# Patient Record
Sex: Male | Born: 1945
Health system: Southern US, Community
[De-identification: ages and names within clinical notes are randomized; demographics above are authoritative.]

## PROBLEM LIST (undated history)

## (undated) DIAGNOSIS — Z951 Presence of aortocoronary bypass graft: Secondary | ICD-10-CM

## (undated) DIAGNOSIS — M519 Unspecified thoracic, thoracolumbar and lumbosacral intervertebral disc disorder: Secondary | ICD-10-CM

## (undated) DIAGNOSIS — M5430 Sciatica, unspecified side: Secondary | ICD-10-CM

## (undated) DIAGNOSIS — I10 Essential (primary) hypertension: Secondary | ICD-10-CM

## (undated) DIAGNOSIS — E785 Hyperlipidemia, unspecified: Secondary | ICD-10-CM

## (undated) DIAGNOSIS — E079 Disorder of thyroid, unspecified: Secondary | ICD-10-CM

## (undated) DIAGNOSIS — I2510 Atherosclerotic heart disease of native coronary artery without angina pectoris: Secondary | ICD-10-CM

## (undated) DIAGNOSIS — I4891 Unspecified atrial fibrillation: Secondary | ICD-10-CM

## (undated) DIAGNOSIS — L409 Psoriasis, unspecified: Secondary | ICD-10-CM

## (undated) HISTORY — DX: Sciatica, unspecified side: M54.30

## (undated) HISTORY — DX: Unspecified atrial fibrillation: I48.91

## (undated) HISTORY — DX: Presence of aortocoronary bypass graft: Z95.1

## (undated) HISTORY — DX: Unspecified thoracic, thoracolumbar and lumbosacral intervertebral disc disorder: M51.9

## (undated) HISTORY — PX: TONSILLECTOMY: SUR1361

## (undated) HISTORY — DX: Hyperlipidemia, unspecified: E78.5

## (undated) HISTORY — DX: Essential (primary) hypertension: I10

## (undated) HISTORY — DX: Psoriasis, unspecified: L40.9

## (undated) HISTORY — DX: Disorder of thyroid, unspecified: E07.9

---

## 2005-06-15 ENCOUNTER — Emergency Department (HOSPITAL_COMMUNITY): Admission: EM | Admit: 2005-06-15 | Discharge: 2005-06-15 | Payer: Self-pay | Admitting: Emergency Medicine

## 2011-01-13 ENCOUNTER — Inpatient Hospital Stay (HOSPITAL_COMMUNITY)
Admission: EM | Admit: 2011-01-13 | Discharge: 2011-01-24 | DRG: 546 | Disposition: A | Discharge status: Home / Self Care | Payer: BC Managed Care – PPO | Attending: Surgery | Admitting: Surgery

## 2011-01-13 ENCOUNTER — Encounter (HOSPITAL_COMMUNITY)
Admission: EM | Disposition: A | Discharge status: Home / Self Care | Payer: Self-pay | Source: Home / Self Care | Attending: Cardiology

## 2011-01-13 ENCOUNTER — Encounter: Payer: Self-pay | Admitting: Nurse Practitioner

## 2011-01-13 ENCOUNTER — Emergency Department (HOSPITAL_COMMUNITY): Payer: BC Managed Care – PPO

## 2011-01-13 ENCOUNTER — Ambulatory Visit (HOSPITAL_COMMUNITY): Admit: 2011-01-13 | Payer: Self-pay | Admitting: Cardiology

## 2011-01-13 DIAGNOSIS — I2510 Atherosclerotic heart disease of native coronary artery without angina pectoris: Secondary | ICD-10-CM

## 2011-01-13 DIAGNOSIS — I249 Acute ischemic heart disease, unspecified: Secondary | ICD-10-CM

## 2011-01-13 DIAGNOSIS — E785 Hyperlipidemia, unspecified: Secondary | ICD-10-CM | POA: Diagnosis present

## 2011-01-13 DIAGNOSIS — Y9210 Unspecified residential institution as the place of occurrence of the external cause: Secondary | ICD-10-CM | POA: Diagnosis not present

## 2011-01-13 DIAGNOSIS — I4891 Unspecified atrial fibrillation: Secondary | ICD-10-CM | POA: Diagnosis present

## 2011-01-13 DIAGNOSIS — IMO0002 Reserved for concepts with insufficient information to code with codable children: Secondary | ICD-10-CM | POA: Diagnosis not present

## 2011-01-13 DIAGNOSIS — I10 Essential (primary) hypertension: Secondary | ICD-10-CM | POA: Diagnosis present

## 2011-01-13 DIAGNOSIS — R079 Chest pain, unspecified: Secondary | ICD-10-CM

## 2011-01-13 DIAGNOSIS — Y840 Cardiac catheterization as the cause of abnormal reaction of the patient, or of later complication, without mention of misadventure at the time of the procedure: Secondary | ICD-10-CM | POA: Diagnosis not present

## 2011-01-13 DIAGNOSIS — I214 Non-ST elevation (NSTEMI) myocardial infarction: Principal | ICD-10-CM

## 2011-01-13 HISTORY — DX: Atherosclerotic heart disease of native coronary artery without angina pectoris: I25.10

## 2011-01-13 HISTORY — PX: LEFT HEART CATHETERIZATION WITH CORONARY ANGIOGRAM: SHX5451

## 2011-01-13 LAB — COMPREHENSIVE METABOLIC PANEL
ALT: 21 U/L (ref 0–53)
AST: 26 U/L (ref 0–37)
Alkaline Phosphatase: 52 U/L (ref 39–117)
CO2: 27 mEq/L (ref 19–32)
Chloride: 105 mEq/L (ref 96–112)
GFR calc non Af Amer: 88 mL/min — ABNORMAL LOW (ref >90)
Potassium: 3.5 mEq/L (ref 3.5–5.1)
Sodium: 139 mEq/L (ref 135–145)
Total Bilirubin: 0.7 mg/dL (ref 0.3–1.2)

## 2011-01-13 LAB — BASIC METABOLIC PANEL
CO2: 29 mEq/L (ref 19–32)
Calcium: 9.1 mg/dL (ref 8.4–10.5)
Creatinine, Ser: 1.03 mg/dL (ref 0.50–1.35)
GFR calc non Af Amer: 74 mL/min — ABNORMAL LOW (ref >90)
Glucose, Bld: 113 mg/dL — ABNORMAL HIGH (ref 70–99)
Potassium: 4.4 mEq/L (ref 3.5–5.1)

## 2011-01-13 LAB — CBC
HCT: 45 % (ref 39.0–52.0)
Hemoglobin: 16.1 g/dL (ref 13.0–17.0)
MCH: 31.3 pg (ref 26.0–34.0)
MCV: 87.5 fL (ref 78.0–100.0)
RDW: 12.4 % (ref 11.5–15.5)

## 2011-01-13 LAB — CARDIAC PANEL(CRET KIN+CKTOT+MB+TROPI)
CK, MB: 8.5 ng/mL (ref 0.3–4.0)
Total CK: 202 U/L (ref 7–232)
Total CK: 199 U/L (ref 7–232)
Troponin I: 1.25 ng/mL (ref <0.30)
Troponin I: 1.2 ng/mL (ref <0.30)

## 2011-01-13 LAB — POCT ACTIVATED CLOTTING TIME: Activated Clotting Time: 166 seconds

## 2011-01-13 LAB — DIFFERENTIAL
Basophils Relative: 0 % (ref 0–1)
Eosinophils Absolute: 0.1 10*3/uL (ref 0.0–0.7)
Eosinophils Relative: 1 % (ref 0–5)
Lymphocytes Relative: 15 % (ref 12–46)
Monocytes Relative: 6 % (ref 3–12)

## 2011-01-13 LAB — MRSA PCR SCREENING: MRSA by PCR: NEGATIVE

## 2011-01-13 LAB — URINALYSIS, ROUTINE W REFLEX MICROSCOPIC
Bilirubin Urine: NEGATIVE
Ketones, ur: NEGATIVE mg/dL
Leukocytes, UA: NEGATIVE
Protein, ur: NEGATIVE mg/dL
Specific Gravity, Urine: 1.012 (ref 1.005–1.030)
pH: 6.5 (ref 5.0–8.0)

## 2011-01-13 LAB — PROTIME-INR: INR: 1 (ref 0.00–1.49)

## 2011-01-13 SURGERY — LEFT HEART CATHETERIZATION WITH CORONARY ANGIOGRAM
Anesthesia: LOCAL

## 2011-01-13 MED ORDER — LIDOCAINE HCL (PF) 1 % IJ SOLN
INTRAMUSCULAR | Status: AC
  Filled 2011-01-13: qty 30

## 2011-01-13 MED ORDER — HEPARIN (PORCINE) IN NACL 2-0.9 UNIT/ML-% IJ SOLN
INTRAMUSCULAR | Status: AC
  Filled 2011-01-13: qty 2000

## 2011-01-13 MED ORDER — FENTANYL CITRATE 0.05 MG/ML IJ SOLN
INTRAMUSCULAR | Status: AC
  Filled 2011-01-13: qty 2

## 2011-01-13 MED ORDER — ASPIRIN EC 81 MG PO TBEC
81.0000 mg | DELAYED_RELEASE_TABLET | Freq: Once | ORAL | Status: DC
  Filled 2011-01-13: qty 1

## 2011-01-13 MED ORDER — NITROGLYCERIN 0.4 MG SL SUBL: 0.4000 mg | SUBLINGUAL_TABLET | SUBLINGUAL | Status: DC | PRN

## 2011-01-13 MED ORDER — SODIUM CHLORIDE 0.9 % IV SOLN: 250.0000 mL | INTRAVENOUS | Status: DC | PRN

## 2011-01-13 MED ORDER — SODIUM CHLORIDE 0.9 % IJ SOLN: 3.0000 mL | Freq: Two times a day (BID) | INTRAMUSCULAR | Status: DC

## 2011-01-13 MED ORDER — ASPIRIN 81 MG PO CHEW: 324.0000 mg | CHEWABLE_TABLET | ORAL | Status: DC

## 2011-01-13 MED ORDER — HEPARIN BOLUS VIA INFUSION
4000.0000 [IU] | Freq: Once | INTRAVENOUS | Status: AC
  Administered 2011-01-13: 4000 [IU] via INTRAVENOUS
  Filled 2011-01-13: qty 4000

## 2011-01-13 MED ORDER — ROSUVASTATIN CALCIUM 20 MG PO TABS
20.0000 mg | ORAL_TABLET | Freq: Every day | ORAL | Status: DC
  Filled 2011-01-13: qty 1

## 2011-01-13 MED ORDER — SODIUM CHLORIDE 0.9 % IJ SOLN: 3.0000 mL | INTRAMUSCULAR | Status: DC | PRN

## 2011-01-13 MED ORDER — METOPROLOL TARTRATE 25 MG PO TABS
25.0000 mg | ORAL_TABLET | Freq: Four times a day (QID) | ORAL | Status: DC
  Administered 2011-01-14 – 2011-01-15 (×6): 25 mg via ORAL
  Filled 2011-01-13 (×11): qty 1

## 2011-01-13 MED ORDER — ROSUVASTATIN CALCIUM 40 MG PO TABS
40.0000 mg | ORAL_TABLET | Freq: Every day | ORAL | Status: DC
  Administered 2011-01-14: 40 mg via ORAL
  Filled 2011-01-13: qty 1

## 2011-01-13 MED ORDER — MORPHINE SULFATE 2 MG/ML IJ SOLN: 2.0000 mg | INTRAMUSCULAR | Status: DC | PRN

## 2011-01-13 MED ORDER — SODIUM CHLORIDE 0.9 % IJ SOLN
3.0000 mL | Freq: Two times a day (BID) | INTRAMUSCULAR | Status: DC
  Administered 2011-01-14 (×2): 3 mL via INTRAVENOUS

## 2011-01-13 MED ORDER — ALPRAZOLAM 0.25 MG PO TABS: 0.2500 mg | ORAL_TABLET | Freq: Two times a day (BID) | ORAL | Status: DC | PRN

## 2011-01-13 MED ORDER — ASPIRIN 81 MG PO CHEW
324.0000 mg | CHEWABLE_TABLET | Freq: Once | ORAL | Status: AC
  Administered 2011-01-13: 324 mg via ORAL
  Filled 2011-01-13: qty 4

## 2011-01-13 MED ORDER — ROSUVASTATIN CALCIUM 40 MG PO TABS
40.0000 mg | ORAL_TABLET | ORAL | Status: AC
  Filled 2011-01-13: qty 1

## 2011-01-13 MED ORDER — ONDANSETRON HCL 4 MG/2ML IJ SOLN: 4.0000 mg | Freq: Four times a day (QID) | INTRAMUSCULAR | Status: DC | PRN

## 2011-01-13 MED ORDER — SODIUM CHLORIDE 0.9 % IV SOLN
INTRAVENOUS | Status: DC
  Administered 2011-01-14: 02:00:00 via INTRAVENOUS

## 2011-01-13 MED ORDER — ASPIRIN EC 81 MG PO TBEC
81.0000 mg | DELAYED_RELEASE_TABLET | Freq: Every day | ORAL | Status: DC
  Administered 2011-01-14 – 2011-01-18 (×5): 81 mg via ORAL
  Filled 2011-01-13 (×6): qty 1

## 2011-01-13 MED ORDER — SODIUM CHLORIDE 0.9 % IV SOLN: 1.0000 mL/kg/h | INTRAVENOUS | Status: DC

## 2011-01-13 MED ORDER — MIDAZOLAM HCL 2 MG/2ML IJ SOLN
INTRAMUSCULAR | Status: AC
  Filled 2011-01-13: qty 2

## 2011-01-13 MED ORDER — ACETAMINOPHEN 325 MG PO TABS: 650.0000 mg | ORAL_TABLET | ORAL | Status: DC | PRN

## 2011-01-13 MED ORDER — HEPARIN SOD (PORCINE) IN D5W 100 UNIT/ML IV SOLN
16.0000 [IU]/kg/h | INTRAVENOUS | Status: DC
  Administered 2011-01-13: 16 [IU]/kg/h via INTRAVENOUS
  Filled 2011-01-13: qty 250

## 2011-01-13 MED ORDER — NITROGLYCERIN 0.2 MG/ML ON CALL CATH LAB
INTRAVENOUS | Status: AC
  Filled 2011-01-13: qty 1

## 2011-01-13 NOTE — ED Notes (Signed)
C/o "sinking feeling" in chest once yesterday and 2x today, "Feel sweaty when it happens." BP have been elevated at home per family

## 2011-01-13 NOTE — Progress Notes (Signed)
ANTICOAGULATION CONSULT NOTE - Initial Consult  Pharmacy Consult for Heparin Indication: chest pain/ACS  Allergies  Allergen Reactions  . Dilaudid (Hydromorphone Hcl) Hypertension    Back and forth with hyper and hypotension    Patient Measurements: Height: 5\' 10"  (177.8 cm) Weight: 200 lb (90.719 kg) IBW/kg (Calculated) : 73   Vital Signs: Temp: 98.5 F (36.9 C) (11/29 1208) Temp src: Oral (11/29 1208) BP: 157/101 mmHg (11/29 1208) Pulse Rate: 65  (11/29 1208)  Labs:  Basename 01/13/11 1039  HGB 16.1  HCT 45.0  PLT 160  APTT --  LABPROT 13.4  INR 1.00  HEPARINUNFRC --  CREATININE 1.03  CKTOTAL 202  CKMB 8.2*  TROPONINI 0.45*   Estimated Creatinine Clearance: 81 ml/min (by C-G formula based on Cr of 1.03).  Medical History: History reviewed. No pertinent past medical history.  Assessment: 65 year old admitted with CP, heparin drip started at 12 noon in ED with 4000 unit IV bolus and heparin drip at 1450 units/hr, awaiting cardiology consult  Goal of Therapy:  Heparin level 0.3-0.7 units/ml   Plan:  1) Heparin 4000 units iv bolus x 1 2) Heparin drip at 1450 units/hr  Started in ED at 12 noon  3) Heparin level at 1830 pm 4) Daily Heparin level, CBC  Jonathon [name] 01/13/2011,1:48 PM

## 2011-01-13 NOTE — ED Provider Notes (Signed)
History     CSN: [id number] Arrival date & time: 01/13/2011  9:49 AM   First MD Initiated Contact with Patient 01/13/11 1025      Chief Complaint  Patient presents with  . Chest Pain    (Consider location/radiation/quality/duration/timing/severity/associated sxs/prior treatment) HPI Comments: Patient reports heaviness and sinking feeling in his chest is came on yesterday and resolved after a few minutes and again 2 episodes of the sinking feeling today that resolved on its own. He denies any pain, shortness of breath, nausea or vomiting. He did experience some sweating with these episodes of heaviness. His have elevated blood pressure at home but is not take blood pressure medications. He does not have a doctor. Reports having a physical one year ago and told he had elevated cholesterol. He has never had a stress test. He is pain-free at this time and denies any complaints. The pain was substernal it did not radiate and resolved on its own after a few minutes. His paramedics son told him to come in and be evaluated because the episodes are getting longer induration becoming more frequent.  The history is provided by the patient.    Past Medical History  Diagnosis Date  . Coronary artery disease   . Myocardial infarction 01/13/2011    nonstemi    Past Surgical History  Procedure Date  . Tonsillectomy     History reviewed. No pertinent family history.  History  Substance Use Topics  . Smoking status: Former Smoker    Quit date: 05/12/1968  . Smokeless tobacco: Never Used  . Alcohol Use: Yes     occasional      Review of Systems  Constitutional: Positive for diaphoresis, activity change and fatigue. Negative for fever.  HENT: Negative for congestion and rhinorrhea.   Eyes: Negative for visual disturbance.  Respiratory: Positive for chest tightness. Negative for cough and shortness of breath.   Cardiovascular: Positive for chest pain.  Gastrointestinal: Negative for  nausea, vomiting and abdominal pain.  Genitourinary: Negative for dysuria and hematuria.  Musculoskeletal: Negative for back pain.  Neurological: Negative for dizziness and headaches.    Allergies  Dilaudid  Home Medications   Current Outpatient Rx  Name Route Sig Dispense Refill  . ASPIRIN EC 81 MG PO TBEC Oral Take 81 mg by mouth daily.        BP 158/101  Pulse 74  Temp(Src) 98.5 F (36.9 C) (Oral)  Resp 16  Ht 5\' 10"  (1.778 m)  Wt 200 lb (90.719 kg)  BMI 28.70 kg/m2  SpO2 99%  Physical Exam  Constitutional: He is oriented to person, place, and time. He appears well-developed and well-nourished. No distress.  HENT:  Head: Normocephalic and atraumatic.  Mouth/Throat: No oropharyngeal exudate.  Eyes: Conjunctivae are normal. Pupils are equal, round, and reactive to light.  Neck: Normal range of motion.  Cardiovascular: Normal rate, regular rhythm and normal heart sounds.   Pulmonary/Chest: Effort normal and breath sounds normal.  Abdominal: Soft. There is no tenderness. There is no rebound and no guarding.  Musculoskeletal: Normal range of motion. He exhibits no edema and no tenderness.  Neurological: He is alert and oriented to person, place, and time. No cranial nerve deficit.    ED Course  Procedures (including critical care time)  Labs Reviewed  DIFFERENTIAL - Abnormal; Notable for the following:    Neutrophils Relative 78 (*)    All other components within normal limits  BASIC METABOLIC PANEL - Abnormal; Notable for the following:  Glucose, Bld 113 (*)    GFR calc non Af Amer 74 (*)    GFR calc Af Amer 86 (*)    All other components within normal limits  CARDIAC PANEL(CRET KIN+CKTOT+MB+TROPI) - Abnormal; Notable for the following:    CK, MB 8.2 (*)    Troponin I 0.45 (*)    Relative Index 4.1 (*)    All other components within normal limits  CBC  PROTIME-INR  URINALYSIS, ROUTINE W REFLEX MICROSCOPIC  CARDIAC PANEL(CRET KIN+CKTOT+MB+TROPI)  HEPARIN  LEVEL (UNFRACTIONATED)   Dg Chest 2 View  01/13/2011  *RADIOLOGY REPORT*  Clinical Data: Chest tightness, dizziness  CHEST - 2 VIEW  Comparison: None  Findings: Normal heart size, mediastinal contours, and pulmonary vascularity. Lungs clear. Bones unremarkable. No pneumothorax.  IMPRESSION: No acute abnormalities.  Original Report Authenticated By: Lollie Marrow, M.D.     1. NSTEMI (non-ST elevated myocardial infarction)   2. Acute coronary syndrome       MDM  Chest heaviness with diaphoresis.  No pain at this time.  Given age and risk factors, patient has moderate suspicion for ACS.  Will check labs, CXR.  Dose ASA.   Date: 01/13/2011  Rate: 88  Rhythm: normal sinus rhythm  QRS Axis: normal  Intervals: normal  ST/T Wave abnormalities: ST depressions inferiorly  Conduction Disutrbances:none  Narrative Interpretation: ST depressions in the inferior leads  Old EKG Reviewed: none available  Patient has ST depressions inferiorly and his EKG without any comparison available. He is very comfortable with stable vital signs and is pain-free at this time.  Troponin is mildly elevated as is CK-MB. He started on heparin drip for acute coronary syndrome and I placed a consult to cardiology.   CRITICAL CARE Performed by: Glynn Octave   Total critical care time: 40  Critical care time was exclusive of separately billable procedures and treating other patients.  Critical care was necessary to treat or prevent imminent or life-threatening deterioration.  Critical care was time spent personally by me on the following activities: development of treatment plan with patient and/or surrogate as well as nursing, discussions with consultants, evaluation of patient's response to treatment, examination of patient, obtaining history from patient or surrogate, ordering and performing treatments and interventions, ordering and review of laboratory studies, ordering and review of radiographic  studies, pulse oximetry and re-evaluation of patient's condition.   Glynn Octave, MD 01/13/11 [phone]

## 2011-01-13 NOTE — H&P (Signed)
Hospital Admission Note   Date: 01/13/2011  Patient name: Jonathon [name] Medical record number: [id number] Date of birth: 11-12-1945 Age: 65 y.o. Gender: male PCP: No primary provider on file.  Chief Complaint: CP  History of Present Illness: Patient is a 65 year old male with no significant past medical history presents to the ED with heaviness and sinking feeling in his chest that started Wednesday 11/28 while he was babysitting his grandchild, the pain was described as an "empty feeling in the chest" which was associated with diaphoresis, had no radiation and resolved after a few minutes. This chest pain reoccurred today while the patient was driving, this time it also lasted for a little longer than the first episode, but resolved in under 10 minutes. Note that the patient has a paramedics son which told him to come in and be evaluated because the episodes are getting longer induration becoming more frequent, this prompted the patient to present to the ED for further evaluation. Patient denies symptoms of exertional CP or other CAD symptoms, also reports not having a primary care physician, but does report that he has been taking a baby aspirin for the past 20 years. Note that he has been told during routine physicals in the past that he has hypertension and hyperlipidemia, which are both currently unmanaged. At this time he denies any chest pain, shortness of breath, nausea, vomiting or any other complaints.  Meds: Medications Prior to Admission  Medication Dose Route Frequency Provider Last Rate Last Dose  . aspirin chewable tablet 324 mg  324 mg Oral Once Glynn Octave, MD   324 mg at 01/13/11 1136  . heparin 100 units/mL bolus via infusion 4,000 Units  4,000 Units Intravenous Once Glynn Octave, MD   4,000 Units at 01/13/11 1214  . heparin ADULT infusion 100 units/ml (25000 units/250 ml)  16 Units/kg/hr Intravenous Continuous Glynn Octave, MD 14.5 mL/hr at 01/13/11 1216 16 Units/kg/hr  at 01/13/11 1216   No current outpatient prescriptions on file as of 01/13/2011.    Allergies: Dilaudid  History   Social History  . Marital Status: Married    Spouse Name: N/A    Number of Children: N/A  . Years of Education: N/A   Occupational History  . Not on file.   Social History Main Topics  . Smoking status: Never Smoker   . Smokeless tobacco: Never Used  . Alcohol Use: Yes  . Drug Use: No  . Sexually Active: Yes   Other Topics Concern  . Not on file   Social History Narrative  . Patient lives in [place] with wife, has 2 healthy sons. Works in Administrator, [employer]    Family History: Father died of an MI age 2, mother had CABG at age 25 and lived until age 25.  Review of Systems: Negative except per HPI.  Physical Exam: Blood pressure 157/101, pulse 65, temperature 98.5 F (36.9 C), temperature source Oral, resp. rate 16, height 5\' 10"  (1.778 m), weight 200 lb (90.719 kg), SpO2 99.00%. General:  alert, well-developed, and cooperative to examination.   Head:  normocephalic and atraumatic.   Mouth:  pharynx pink and moist, no erythema, and no exudates.   Neck:  supple, full ROM, no thyromegaly, no JVD, and no carotid bruits.   Lungs:  normal respiratory effort, no accessory muscle use, normal breath sounds, no crackles, and no wheezes. Heart:  normal rate, regular rhythm, no murmur, no gallop, and no rub.   Abdomen:  soft, non-tender, normal bowel  sounds, no distention, no guarding, no rebound tenderness, no hepatomegaly, and no splenomegaly.   Pulses:  2+ DP/PT pulses bilaterally Extremities:  No cyanosis, clubbing, edema Neurologic:  alert & oriented X3, cranial nerves II-XII intact, strength normal in all extremities, sensation intact to light touch, and gait normal.   Skin:  turgor normal with psoriatic lesion of knee and elbow caps.  Psych:  Oriented X3, memory intact for recent and remote, normally interactive, good eye contact, not anxious appearing,  and not depressed appearing.  Lab results: Basic Metabolic Panel:  Basename 01/13/11 1039  NA 139  K 4.4  CL 103  CO2 29  GLUCOSE 113*  BUN 17  CREATININE 1.03  CALCIUM 9.1  MG --  PHOS --   CBC:  Basename 01/13/11 1039  WBC 7.3  NEUTROABS 5.7  HGB 16.1  HCT 45.0  MCV 87.5  PLT 160   Cardiac Enzymes:  Basename 01/13/11 1039  CKTOTAL 202  CKMB 8.2*  CKMBINDEX --  TROPONINI 0.45*   Coagulation:  Basename 01/13/11 1039  LABPROT 13.4  INR 1.00   Imaging results:  Dg Chest 2 View  01/13/2011  *RADIOLOGY REPORT*  Clinical Data: Chest tightness, dizziness  CHEST - 2 VIEW  Comparison: None  Findings: Normal heart size, mediastinal contours, and pulmonary vascularity. Lungs clear. Bones unremarkable. No pneumothorax.  IMPRESSION: No acute abnormalities.  Original Report Authenticated By: Lollie Marrow, M.D.    Assessment & Plan by Problem:  NSTEMI (non-ST elevated myocardial infarction) Chest Pain: Patient presents with chest pain not related to exertion, which has been ongoing intermittently since yesterday, patient has not had similar symptoms in the past, has no history of CAD noted. EKG demonstrates no acute changes. 1st Troponin and CK-MB are elevated at 0.45 and 8.2 respectively. His symptoms and imaging findings are not consistent with other emergent differentials for chest pain such as PE, Pneumona, or Pneumothorax. Given the patient's history and presentation, patient most likely is having NSTEMI. Plan: -Will admit to TELE -Full dose heparin for ACS -Cycle CE x3 for trends -12 lead EKG in AM, and fasting lipids. -Check 2D Echo to r/o wall motion abnormalities -Will start metoprolol, ASA, crestor, nitroglycerine, morphine -Cardiac catheterization today given high likelihood of unstable CAD.  Hypertension Patient reports a history of hypertension in the past but does not take any medication for this. During this hospitalization we will start metoprolol, and  consider adding and ACEI if BP still tolerates in AM.   Signed: Cherelle Midkiff 01/13/2011, 1:55 PM   History reviewed with the patient, no changes to be made.  He has had no prior cardiac history.  However, he presents with a vague chest discomfort for two days.  EKG demonstrates no acute changes although his troponin is slightly elevated. He does not see doctors frequently. He has a very strong family history for early coronary artery disease. The patient exam reveals lungs clear, COR no murmur, Abd positive bowel sounds. Extremities without edema.  All available labs, radiology testing, previous records reviewed. Agree with documented assessment and plan.  Pt with unstable angina.  Currently pain free.  He will need cardiac cath. I discussed this and explained the risks benefits.  He agrees to proceed.  Other plans as above. Fayrene Fearing Hochrein  3:23 PM 12/31/2010

## 2011-01-13 NOTE — ED Notes (Signed)
Pt moved from pto to room. Family at the bedside. Pt advised of plan of care. Heparin drip started. Pt resting comfortably. No questions or issues at this time. Waiting on room assignment and cardiology consult. Pt denies any chest pain or any other s/s.

## 2011-01-13 NOTE — ED Notes (Signed)
Pt CKMB and Tropnin elevated. MD made aware. Pt moved to exam room to be placed on a monitor.

## 2011-01-13 NOTE — Interval H&P Note (Signed)
History and Physical Interval Note:  01/13/2011 6:17 PM  Jonathon [name]  has presented today for surgery, with the diagnosis of Chest pain  The various methods of treatment have been discussed with the patient and family. After consideration of risks, benefits and other options for treatment, the patient has consented to  Procedure(s): LEFT HEART CATHETERIZATION WITH CORONARY ANGIOGRAM as a surgical intervention .  The patients' history has been reviewed, patient examined, no change in status, stable for surgery.  I have reviewed the patients' chart and labs.  Questions were answered to the patient's satisfaction.     Theron Arista [place]

## 2011-01-13 NOTE — Op Note (Signed)
Cardiac Catheterization Procedure Note  Name: Jonathon [name] MRN: [id number] DOB: 1945/09/20  Procedure: Left Heart Cath, Selective Coronary Angiography, LV angiography  Indication: 65 year old white male who presents with a non-ST elevation myocardial infarction today.   Procedural details: The right groin was prepped, draped, and anesthetized with 1% lidocaine. Using modified Seldinger technique, a 5 French sheath was introduced into the right femoral artery. Standard Judkins catheters were used for coronary angiography and left ventriculography. Catheter exchanges were performed over a guidewire. During the procedure the patient developed a modest hematoma around the cardiac cath site. Was managed with direct pressure during the procedure and the femoral sheath was removed immediately post procedure and hemostasis was obtained with manual pressure. The patient was transferred to the post catheterization recovery area for further monitoring.  Procedural Findings: Hemodynamics:  AO 139/85 with a mean of 110 mmHg LV 131 with an EDP of 5 mm of mercury.   Coronary angiography: Coronary dominance: Left  Left mainstem: Normal.  Left anterior descending (LAD): There is a 50% ostial narrowing of the LAD. The LAD is a large vessel that extends to the apex. There is mild 20% disease in the proximal vessel.  There is a moderate sized intermediate branch. This has 40% ostial disease followed by 40% disease in the proximal vessel.  Left circumflex (LCx): This is a large dominant vessel. There is a segmental 40% stenosis in the proximal vessel. The first marginal branch is a moderately large branch with a 95% stenosis proximally. The remainder of the circumflex is without significant disease.  Right coronary artery (RCA): This is a small nondominant vessel with a moderate size conus branch. It is normal.  Left ventriculography: Left ventricular systolic function is normal, LVEF is estimated at  55-65%, there is no significant mitral regurgitation   Final Conclusions:   1. Single vessel obstructive atherosclerotic coronary disease with the culprit lesion in the rock small first obtuse marginal vessel. 2. Normal left ventricular function.  Recommendations: Given the patient's hematoma at the femoral access site during the procedure I was reluctant to proceed with intervention of the marginal lesion. The patient has not had any chest pain for 10 hours. He is hemodynamically stable. I felt it was prudent to stop after his diagnostic angiogram and remove his femoral sheath for hemostasis. I think it would be safer to bring him back tomorrow for intervention of the marginal branch via a radial approach. We will hold his heparin currently but continue aspirin. If his groin is stable in the morning we will load him with Plavix. We will tentatively schedule him for percutaneous intervention of the marginal branch tomorrow.  [name] 01/13/2011, 7:02 PM

## 2011-01-14 ENCOUNTER — Encounter (HOSPITAL_COMMUNITY)
Admission: EM | Disposition: A | Discharge status: Home / Self Care | Payer: Self-pay | Source: Home / Self Care | Attending: Cardiology

## 2011-01-14 ENCOUNTER — Other Ambulatory Visit: Payer: Self-pay

## 2011-01-14 DIAGNOSIS — I2510 Atherosclerotic heart disease of native coronary artery without angina pectoris: Secondary | ICD-10-CM

## 2011-01-14 DIAGNOSIS — I517 Cardiomegaly: Secondary | ICD-10-CM

## 2011-01-14 DIAGNOSIS — Z01810 Encounter for preprocedural cardiovascular examination: Secondary | ICD-10-CM

## 2011-01-14 DIAGNOSIS — I214 Non-ST elevation (NSTEMI) myocardial infarction: Secondary | ICD-10-CM

## 2011-01-14 LAB — HEPARIN LEVEL (UNFRACTIONATED): Heparin Unfractionated: 0.61 IU/mL (ref 0.30–0.70)

## 2011-01-14 LAB — LIPID PANEL
HDL: 35 mg/dL — ABNORMAL LOW (ref >39)
LDL Cholesterol: 132 mg/dL — ABNORMAL HIGH (ref 0–99)

## 2011-01-14 LAB — TSH: TSH: 5.617 u[IU]/mL — ABNORMAL HIGH (ref 0.350–4.500)

## 2011-01-14 LAB — BASIC METABOLIC PANEL
Chloride: 106 mEq/L (ref 96–112)
Creatinine, Ser: 0.89 mg/dL (ref 0.50–1.35)
GFR calc Af Amer: >90 mL/min (ref >90)

## 2011-01-14 SURGERY — PERCUTANEOUS CORONARY STENT INTERVENTION (PCI-S)
Anesthesia: LOCAL

## 2011-01-14 SURGERY — PERCUTANEOUS CORONARY INTERVENTION-BALLOON ONLY
Anesthesia: Moderate Sedation

## 2011-01-14 MED ORDER — CLOPIDOGREL BISULFATE 300 MG PO TABS
600.0000 mg | ORAL_TABLET | Freq: Once | ORAL | Status: AC
  Administered 2011-01-14: 600 mg via ORAL
  Filled 2011-01-14: qty 2

## 2011-01-14 MED ORDER — HEPARIN SOD (PORCINE) IN D5W 100 UNIT/ML IV SOLN
1000.0000 [IU]/h | INTRAVENOUS | Status: DC
  Administered 2011-01-14: 1400 [IU]/h via INTRAVENOUS
  Administered 2011-01-15: 1000 [IU]/h via INTRAVENOUS
  Filled 2011-01-14 (×4): qty 250

## 2011-01-14 MED ORDER — SODIUM CHLORIDE 0.9 % IV SOLN: INTRAVENOUS | Status: AC

## 2011-01-14 NOTE — Progress Notes (Signed)
*  PRELIMINARY RESULTS*  Pre CABG upper extremity has been performed. Palmar arch evaluation= Right waveform remains normal with radial compression and obliterated with ulnar compression. Left waveform remains normal with radial compression and decreased >50% with ulnar compression.   Jonathon [name] 01/14/2011, 4:25 PM

## 2011-01-14 NOTE — Consult Note (Signed)
ANTICOAGULATION CONSULT NOTE - Follow Up Consult  Pharmacy Consult for Heparin Indication: NSTEMI  Allergies  Allergen Reactions  . Dilaudid (Hydromorphone Hcl) Hypertension    Back and forth with hyper and hypotension    Patient Measurements: Height: 5\' 10"  (177.8 cm) Weight: 193 lb 5.5 oz (87.7 kg) IBW/kg (Calculated) : 73    Vital Signs: Temp: 98.5 F (36.9 C) (11/30 2000) Temp src: Oral (11/30 2000) BP: 136/80 mmHg (11/30 2000) Pulse Rate: 91  (11/30 1217)  Labs:  Basename 01/14/11 2004 01/14/11 0910 01/14/11 0258 01/13/11 2018 01/13/11 1749 01/13/11 1039  HGB -- -- -- -- -- 16.1  HCT -- -- -- -- -- 45.0  PLT -- -- -- -- -- 160  APTT -- -- -- -- -- --  LABPROT -- -- -- -- -- 13.4  INR -- -- -- -- -- 1.00  HEPARINUNFRC 0.61 -- -- -- <0.10* --  CREATININE -- -- 0.89 0.89 -- 1.03  CKTOTAL -- 110 125 167 -- --  CKMB -- 6.2* 6.8* 8.5* -- --  TROPONINI -- 1.17* 1.52* 1.20* -- --   Estimated Creatinine Clearance: 92.3 ml/min (by C-G formula based on Cr of 0.89).   Medications:  Scheduled:    . aspirin EC  81 mg Oral Once  . aspirin EC  81 mg Oral Daily  . clopidogrel  600 mg Oral Once  . metoprolol tartrate  25 mg Oral Q6H  . rosuvastatin  40 mg Oral NOW   Followed by  . rosuvastatin  40 mg Oral Q0600  . DISCONTD: sodium chloride  3 mL Intravenous Q12H    Infusions:    . sodium chloride 10 mL/hr at 01/14/11 1800  . heparin 14 mL/hr (01/14/11 1800)  . DISCONTD: sodium chloride 100 mL/hr at 01/14/11 1000    Assessment: Heparin level above stated goal ( 0.3 - 0.4 )  No complications noted.  Goal of Therapy:  Heparin level  0.3 - 0.4 as per MD   Plan:  Decrease Heparin infusion to 1100 units/hr.  Fremon Zacharia, Elisha Headland, Pharm.D. 01/14/2011 9:20 PM

## 2011-01-14 NOTE — Progress Notes (Signed)
Patient ID: Jonathon [name], male   DOB: 1946-01-03, 65 y.o.   MRN: [id number]  I have reviewed the films with Dr. Antoine Poche in detail.  The CFX lesion is complex and involves a bifurcation of the dominant CFX.  I think there is significant involvement of the LAD and ramus.  As such, we think a surgical consult first as a team approach (Class I) makes the most sense.  Would lean toward revascularization surgery as the LAD and ramus are not approachable from a percutaneous approach going forward.  Face to face time of greater than thirty minutes with the patient and family.  Will start very low dose IV heparin, Dc plavix, and await surgical opinion.  [name] 01/14/2011 10:56 AM

## 2011-01-14 NOTE — Progress Notes (Signed)
*  PRELIMINARY RESULTS* Pre CABG Carotid Doppler has been performed. No significant ICA stenosis with antegrade vertebral flow bilaterally.   Farrel Demark 01/14/2011, 5:30 PM

## 2011-01-14 NOTE — Consult Note (Signed)
[address]            [place] [address]          [phone]       Hilberto Burzynski [facility] Medical Record #[id number] Date of Birth: [birth year]  Referring cardiologist: Dr. Riley Kill No primary provider on file.  Chief Complaint:    Chief Complaint  Patient presents with  . Chest Pain    History of Present Illness:      The patient is a 65 year old gentleman with no prior significant medical history who reports new onset of heaviness in his chest on Wednesday, 01/04/2011 while he was babysitting his grandchild. He described this as a "hunger pain" which was associated with diaphoresis. This resolved after a few minutes. The pain recurred the following day while he was driving and lasted a little longer but resolved in about 10 minutes. His son is a paramedic and told him to come to the hospital for evaluation. He had positive cardiac enzymes at the time of presentation and ruled in for a non-ST segment elevation MI. Since admission he has had no further symptoms.   Current Activity/ Functional Status: Patient is independent with mobility/ambulation, transfers, ADL's, IADL's.   Past Medical History  Diagnosis Date  . Coronary artery disease   . Myocardial infarction 01/13/2011    nonstemi    Past Surgical History  Procedure Date  . Tonsillectomy     History  Smoking status  . Former Smoker  . Quit date: 05/12/1968  Smokeless tobacco  . Never Used    History  Alcohol Use  . Yes    occasional    History   Social History  . Marital Status: Married    Spouse Name: N/A    Number of Children: N/A  . Years of Education: N/A   Occupational History  . Not on file.   Social History Main Topics  . Smoking status: Former Smoker    Quit date: 05/12/1968  . Smokeless tobacco: Never Used  . Alcohol Use: Yes     occasional  . Drug Use: No  . Sexually Active: Yes   Other Topics Concern  . Not on file   Social History  Narrative  . No narrative on file    Allergies  Allergen Reactions  . Dilaudid (Hydromorphone Hcl) Hypertension    Back and forth with hyper and hypotension    Current Facility-Administered Medications  Medication Dose Route Frequency Provider Last Rate Last Dose  . 0.9 %  sodium chloride infusion   Intravenous Continuous [name], MD 10 mL/hr at 01/14/11 1300    . acetaminophen (TYLENOL) tablet 650 mg  650 mg Oral Q4H PRN Darnelle Maffucci      . ALPRAZolam Prudy Feeler) tablet 0.25 mg  0.25 mg Oral BID PRN Darnelle Maffucci      . aspirin EC tablet 81 mg  81 mg Oral Once Darnelle Maffucci      . aspirin EC tablet 81 mg  81 mg Oral Daily Luisa Hart Tobbia   81 mg at 01/14/11 0910  . clopidogrel (PLAVIX) tablet 600 mg  600 mg Oral Once Peter Swaziland, MD   600 mg at 01/14/11 0910  . fentaNYL (SUBLIMAZE) 0.05 MG/ML injection           . heparin 2-0.9 UNIT/ML-% infusion           .  heparin ADULT infusion 100 units/ml (25000 units/250 ml)  1,400 Units/hr Intravenous Continuous [name], MD 14 mL/hr at 01/14/11 1300 14 mL/hr at 01/14/11 1300  . lidocaine (XYLOCAINE) 1 % injection           . metoprolol tartrate (LOPRESSOR) tablet 25 mg  25 mg Oral Q6H Peter Swaziland, MD   25 mg at 01/14/11 1217  . midazolam (VERSED) 2 MG/2ML injection           . morphine 2 MG/ML injection 2 mg  2 mg Intravenous Q4H PRN Darnelle Maffucci      . nitroGLYCERIN (NITROSTAT) SL tablet 0.4 mg  0.4 mg Sublingual Q5 min PRN Darnelle Maffucci      . nitroGLYCERIN (NTG ON-CALL) 0.2 mg/mL injection           . ondansetron (ZOFRAN) injection 4 mg  4 mg Intravenous Q6H PRN Darnelle Maffucci      . rosuvastatin (CRESTOR) tablet 40 mg  40 mg Oral NOW Darnelle Maffucci       Followed by  . rosuvastatin (CRESTOR) tablet 40 mg  40 mg Oral Q0600 Peter Swaziland, MD      . DISCONTD: 0.9 %  sodium chloride infusion   Intravenous Continuous Peter [place]azil[place], MD 100 mL/hr at 01/14/11 1000    . DISCONTD: 0.9 %  sodium chloride infusion  250 mL Intravenous  PRN Peter Swaziland, MD 100 mL/hr at 01/13/11 2000 250 mL at 01/13/11 2000  . DISCONTD: 0.9 %  sodium chloride infusion  1 mL/kg/hr Intravenous Continuous Peter Swaziland, MD      . DISCONTD: aspirin chewable tablet 324 mg  324 mg Oral Pre-Cath Peter Swaziland, MD      . DISCONTD: heparin ADULT infusion 100 units/ml (25000 units/250 ml)  16 Units/kg/hr Intravenous Continuous Glynn Octave, MD 14.5 mL/hr at 01/13/11 1216 16 Units/kg/hr at 01/13/11 1216  . DISCONTD: rosuvastatin (CRESTOR) tablet 20 mg  20 mg Oral Daily Darnelle Maffucci      . DISCONTD: sodium chloride 0.9 % injection 3 mL  3 mL Intravenous Q12H Patrick Tobbia   3 mL at 01/14/11 0911  . DISCONTD: sodium chloride 0.9 % injection 3 mL  3 mL Intravenous PRN Darnelle Maffucci      . DISCONTD: sodium chloride 0.9 % injection 3 mL  3 mL Intravenous Q12H Peter Swaziland, MD      . DISCONTD: sodium chloride 0.9 % injection 3 mL  3 mL Intravenous PRN Peter Swaziland, MD          Family hx:  His father died of myocardial infarction in his at age 44. His mother had coronary bypass graft surgery at age 41 and died in her 28s.   Review of Systems:     Cardiac Review of Systems: Y or N  Chest Pain [  y ]  Resting SOB [n   ] Exertional SOB  [ n ]  Orthopnea [n ]   Pedal Edema Milo.Brash  ]    Palpitations [ n ] Syncope  [n  ]  Presyncope [n ]   General Review of Systems: [Y] = yes [  ]=no  Constitional: recent weight change [n]; anorexia [ n ]; fatigue [ n ]; nausea [n ]; night sweats [n  ]; fever [n  ]; or chills [ n ];  Dental: poor dentition[n  ];    Eye : blurred vision [n ]; diplopia Milo.Brash ]; vision changes [ n ];  Amaurosis fugax[ n];  Resp: cough [n  ];  wheezing[ n ];  hemoptysis[ n ]; shortness of breath[ n ]; paroxysmal nocturnal dyspnea[ n ]; dyspnea on exertion[ n; or orthopnea[ n ];   GI:  gallstones[ n], vomiting[ n ];   dysphagia[n]; melena[ n ];  hematochezia [n ]; heartburn[ n ];   Hx of  Colonoscopy[  n];  GU: kidney stones [n  ]; hematuria[n  ];   dysuria [n ];  nocturia[  n];  history of     obstruction [n  ];              Skin: rash, swelling[ y ] rash on legs that his dermatologist said was secondary to             change in detergent;, hair loss[n];  peripheral edema[n];  or itching[ n ];  Musculosketetal: myalgias[ n ];  joint swelling[ n ];  joint erythema[ n ];  joint pain[ n ];  back pain[ n ];   Heme/Lymph: bruising[ n ];  bleeding[ n;  anemia[ n ];   Neuro: TIA[ n ];  headaches[  n];  stroke[  n];  vertigo[ n ];  seizures[ n ];   paresthesias[  n];  difficulty walking[n  ];   Psych:depression[ n]; anxiety[ n ];   Endocrine: diabetes[  n  thyroid dysfunction[ n ]   Immunizations: Flu [ n ]; Pneumococcal[ n ];   Other:  Physical Exam: BP 135/94  Pulse 91  Temp(Src) 98.9 F (37.2 C) (Oral)  Resp 12  Ht 5\' 10"  (1.778 m)  Wt 87.7 kg (193 lb 5.5 oz)  BMI 27.74 kg/m2  SpO2 98%  General appearance: alert and cooperative HEENT: NCAT, PERLA, EOMI, oropharynx clear Neck: Carotid pulses are palpable bilaterally. There no bruits. There is no adenopathy or thyromegaly. Neurologic: intact Heart: regular rate and rhythm, S1, S2 normal, no murmur, click, rub or gallop Lungs: clear to auscultation bilaterally Abdomen: soft, non-tender; bowel sounds normal; no masses,  no organomegaly Extremities: extremities normal, atraumatic, no cyanosis or edema. Pedal pulses are palpable bilaterally.   Diagnostic Studies & Laboratory data:  Cardiac Catheterization Procedure Note  Name: Samarion Ehle MRN: [id number] DOB: [birth year]  Procedure: Left Heart Cath, Selective Coronary Angiography, LV angiography  Indication: 65 year old white male who presents with a non-ST elevation myocardial infarction today.          Procedural details: The right groin was prepped, draped, and anesthetized with 1%  lidocaine. Using modified Seldinger technique, a 5 French sheath was introduced into the right femoral artery. Standard Judkins catheters were used for coronary angiography and left ventriculography. Catheter exchanges were performed over a guidewire. During the procedure the patient developed a modest hematoma around the cardiac cath site. Was managed with direct pressure during the procedure and the femoral sheath was removed immediately post procedure and hemostasis was obtained with manual pressure. The patient was transferred to the post catheterization recovery area for further monitoring.  Procedural Findings: Hemodynamics:  AO 139/85 with a mean of 110 mmHg LV 131 with an EDP of 5 mm of mercury.              Coronary angiography: Coronary dominance: Left  Left mainstem: Normal.  Left anterior descending (LAD): There is a 50% ostial narrowing of the LAD. The LAD is a large vessel that  extends to the apex. There is mild 20% disease in the proximal vessel.  There is a moderate sized intermediate branch. This has 40% ostial disease followed by 40% disease in the proximal vessel.  Left circumflex (LCx): This is a large dominant vessel. There is a segmental 40% stenosis in the proximal vessel. The first marginal branch is a moderately large branch with a 95% stenosis proximally. The remainder of the circumflex is without significant disease.  Right coronary artery (RCA): This is a small nondominant vessel with a moderate size conus branch. It is normal.  Left ventriculography: Left ventricular systolic function is normal, LVEF is estimated at 55-65%, there is no significant mitral regurgitation   Final Conclusions:   1. Single vessel obstructive atherosclerotic coronary disease with the culprit lesion in the rock small first obtuse marginal vessel. 2. Normal left ventricular function.         Recent Radiology Findings:   Dg Chest 2 View  01/13/2011  *RADIOLOGY REPORT*  Clinical  Data: Chest tightness, dizziness  CHEST - 2 VIEW  Comparison: None  Findings: Normal heart size, mediastinal contours, and pulmonary vascularity. Lungs clear. Bones unremarkable. No pneumothorax.  IMPRESSION: No acute abnormalities.  Original Report Authenticated By: Lollie Marrow, M.D.      Recent Lab Findings: Lab Results  Component Value Date   WBC 7.3 01/13/2011   HGB 16.1 01/13/2011   HCT 45.0 01/13/2011   PLT 160 01/13/2011   GLUCOSE 103* 01/14/2011   CHOL 191 01/14/2011   TRIG 120 01/14/2011   HDL 35* 01/14/2011   LDLCALC 132* 01/14/2011   ALT 21 01/13/2011   AST 26 01/13/2011   NA 140 01/14/2011   K 4.0 01/14/2011   CL 106 01/14/2011   CREATININE 0.89 01/14/2011   BUN 15 01/14/2011   CO2 26 01/14/2011   TSH 5.617* 01/13/2011   INR 1.00 01/13/2011       Assessment / Plan:    The patient had significant three-vessel equivalent coronary disease presenting with non-ST segment elevation MI. His culprit appears to be a 95% ostial obtuse marginal stenosis. He also has significant ostial stenosis of the LAD and ramus branch. I think these stenoses are more significant than initially thought on cardiac catheterization. I agree with Dr. Riley Kill that coronary bypass graft surgery is probably the best option for this patient given the ostial stenosis of the obtuse marginal coming off of a large dominant left circumflex coronary artery and the presence of significant ostial disease in the LAD and ramus branch. The patient did receive Plavix in anticipation of possible PCI. We will therefore wait until next Wednesday to do surgery. I discussed the operative procedure with the patient and family including alternatives, benefits and risks; including but not limited to bleeding, blood transfusion, infection, stroke, myocardial infarction, graft failure, heart block requiring a permanent pacemaker, organ dysfunction, and death.  Jayden Ell understands and agrees to proceed. He will remain  in the hospital on heparin and will plan to do surgery on Wednesday 01/18/2011.          @me1 @ 01/14/2011 5:28 PM

## 2011-01-14 NOTE — Progress Notes (Signed)
Subjective:  Doing well.  No chest pain.    Objective:  Vital Signs in the last 24 hours: Temp:  [97.9 F (36.6 C)-98.5 F (36.9 C)] 98.2 F (36.8 C) (11/30 0802) Pulse Rate:  [58-95] 63  (11/30 0802) Resp:  [8-16] 11  (11/30 0802) BP: (105-182)/(73-101) 132/73 mmHg (11/30 0802) SpO2:  [95 %-99 %] 98 % (11/30 0802) Weight:  [87.7 kg (193 lb 5.5 oz)] 193 lb 5.5 oz (87.7 kg) (11/29 1945)  Intake/Output from previous day: 11/29 0701 - 11/30 0700 In: 400 [I.V.:400] Out: 500 [Urine:500]   Physical Exam: General: Well developed, well nourished, in no acute distress. Head:  Normocephalic and atraumatic. Lungs: Clear to auscultation and percussion. Heart: Normal S1 and S2.  No murmur, rubs or gallops. Groin:  No major hematoma.  No knot.  Echymossis,  And no bruit.   Pulses: Pulses normal in all 4 extremities. Extremities: No clubbing or cyanosis. No edema. Neurologic: Alert and oriented x 3.    Lab Results:  [facility] 01/13/11 1039  WBC 7.3  HGB 16.1  PLT 160    Basename 01/14/11 0258 01/13/11 2018  NA 140 139  K 4.0 3.5  CL 106 105  CO2 26 27  GLUCOSE 103* 122*  BUN 15 15  CREATININE 0.89 0.89    Basename 01/14/11 0258 01/13/11 2018  TROPONINI 1.52* 1.20*   Hepatic Function Panel  Basename 01/13/11 2018  PROT 6.5  ALBUMIN 3.7  AST 26  ALT 21  ALKPHOS 52  BILITOT 0.7  BILIDIR --  IBILI --    Basename 01/14/11 0258  CHOL 191   No results found for this basename: PROTIME in the last 72 hours  Imaging: Dg Chest 2 View  01/13/2011  *RADIOLOGY REPORT*  Clinical Data: Chest tightness, dizziness  CHEST - 2 VIEW  Comparison: None  Findings: Normal heart size, mediastinal contours, and pulmonary vascularity. Lungs clear. Bones unremarkable. No pneumothorax.  IMPRESSION: No acute abnormalities.  Original Report Authenticated By: Lollie Marrow, M.D.    EKG:  NSR.  WNL  Cardiac Studies:  Cath studies reviewed  Assessment/Plan:  Patient Active Hospital  Problem List: NSTEMI (non-ST elevated myocardial infarction) (01/13/2011)   Assessment: Stable.  No current chest pain.     Plan: Plan to review films with my colleagues.  Final review and recommendations to follow.        [name], MD, [facility], FSCAI 01/14/2011, 9:53 AM

## 2011-01-14 NOTE — Progress Notes (Signed)
Brief Update Note:   Patient hemodynamically stable and PCI deferred 2/2 multivessel disease. Awaiting 01/18/11 CABG 2/2 plavix given. Patient stable to move to telemetry bed from stepdown. Vitals reviewed; BP 136/80  Pulse 91  Temp(Src) 98.5 F (36.9 C) (Oral)  Resp 13  Ht 5\' 10"  (1.778 m)  Wt 87.7 kg (193 lb 5.5 oz)  BMI 27.74 kg/m2  SpO2 94% , labs reviewed; cr 0.89, K 4.0, h/h 16/45, plt 160. Will follow closely.   Leeann Must, MD

## 2011-01-14 NOTE — Progress Notes (Signed)
*  PRELIMINARY RESULTS* Echocardiogram 2D Echocardiogram has been performed.  Clide Deutscher RDCS 01/14/2011, 12:13 PM

## 2011-01-15 LAB — CBC
HCT: 42.2 % (ref 39.0–52.0)
Hemoglobin: 14.9 g/dL (ref 13.0–17.0)
MCH: 31.1 pg (ref 26.0–34.0)
MCV: 88.1 fL (ref 78.0–100.0)
RBC: 4.79 MIL/uL (ref 4.22–5.81)

## 2011-01-15 LAB — HEPARIN LEVEL (UNFRACTIONATED): Heparin Unfractionated: 0.45 IU/mL (ref 0.30–0.70)

## 2011-01-15 NOTE — Progress Notes (Signed)
Subjective:  Patient was seen by Dr. Laneta Simmers yesterday and is scheduled to have bypass once his Plavix washes out. He has had no chest discomfort overnight and feels well and has only mild amount of discomfort in his groin.  Objective:  Vital Signs in the last 24 hours: BP 121/72  Pulse 74  Temp(Src) 98.4 F (36.9 C) (Oral)  Resp 12  Ht 5\' 10"  (1.778 m)  Wt 87.7 kg (193 lb 5.5 oz)  BMI 27.74 kg/m2  SpO2 95%  Physical Exam: Pleasant white male in no acute distress Lungs:  Clear to A&P Cardiac:  Regular rhythm, normal S1 and S2, no S3 Extremities:  Fairly significant hematoma involving the right groin with a mild firmness but no bruit.  Intake/Output from previous day: 11/30 0701 - 12/01 0700 In: 1815 [P.O.:480; I.V.:1335] Out: 1925 [Urine:1925]  Lab Results: Basic Metabolic Panel:  Basename 01/14/11 0258 01/13/11 2018  NA 140 139  K 4.0 3.5  CL 106 105  CO2 26 27  GLUCOSE 103* 122*  BUN 15 15  CREATININE 0.89 0.89    CBC:  Basename 01/15/11 0405 01/13/11 1039  WBC 7.9 7.3  NEUTROABS -- 5.7  HGB 14.9 16.1  HCT 42.2 45.0  MCV 88.1 87.5  PLT 147* 160    Telemetry: Normal sinus rhythm  Assessment/Plan:  1. Non-ST elevation myocardial infarction with three-vessel coronary artery disease equivalent 2. Mild groin hematoma  Recommendations:  Await Plavix washout and plan bypass surgery. Continue heparin until then.     Darden Palmer.  MD [facility] 01/15/2011, 11:55 AM

## 2011-01-15 NOTE — Consult Note (Signed)
ANTICOAGULATION CONSULT NOTE - Follow Up Consult  Pharmacy Consult for Heparin Indication: CAD s/p cath, plans for CABG  Allergies  Allergen Reactions  . Dilaudid (Hydromorphone Hcl) Hypertension    Back and forth with hyper and hypotension    Patient Measurements: Height: 5\' 10"  (177.8 cm) Weight: 193 lb 5.5 oz (87.7 kg) IBW/kg (Calculated) : 73    Vital Signs: Temp: 98.7 F (37.1 C) (12/01 0100) Temp src: Oral (11/30 2100) BP: 114/75 mmHg (12/01 0100) Pulse Rate: 67  (12/01 0100)  Labs:  Basename 01/15/11 0405 01/14/11 2004 01/14/11 0910 01/14/11 0258 01/13/11 2018 01/13/11 1749 01/13/11 1039  HGB 14.9 -- -- -- -- -- 16.1  HCT 42.2 -- -- -- -- -- 45.0  PLT 147* -- -- -- -- -- 160  APTT -- -- -- -- -- -- --  LABPROT -- -- -- -- -- -- 13.4  INR -- -- -- -- -- -- 1.00  HEPARINUNFRC 0.45 0.61 -- -- -- <0.10* --  CREATININE -- -- -- 0.89 0.89 -- 1.03  CKTOTAL -- -- 110 125 167 -- --  CKMB -- -- 6.2* 6.8* 8.5* -- --  TROPONINI -- -- 1.17* 1.52* 1.20* -- --   Estimated Creatinine Clearance: 92.3 ml/min (by C-G formula based on Cr of 0.89).   Medications:  Scheduled:     . aspirin EC  81 mg Oral Once  . aspirin EC  81 mg Oral Daily  . clopidogrel  600 mg Oral Once  . metoprolol tartrate  25 mg Oral Q6H  . rosuvastatin  40 mg Oral NOW   Followed by  . rosuvastatin  40 mg Oral Q0600  . DISCONTD: sodium chloride  3 mL Intravenous Q12H    Infusions:     . sodium chloride 10 mL/hr at 01/14/11 2200  . heparin 11 mL/hr (01/14/11 2200)  . DISCONTD: sodium chloride 100 mL/hr at 01/14/11 1000   Goal of Therapy:  Heparin level  0.3 - 0.4 as per MD  Assessment: Heparin level slightly above stated goal ( 0.3 - 0.4 ).  Noted plans for CABG Weds 12/5.  No complications noted.  Plan:  Decrease Heparin infusion to 1000 units/hr. Will not recheck level at this time since patient is so near to goal.  Will follow-up with AM labs.  [employer], Pharm.D.,  BCPS Clinical Pharmacist Pager [phone]  01/15/2011 6:35 AM

## 2011-01-16 LAB — CBC
HCT: 40.7 % (ref 39.0–52.0)
Hemoglobin: 14.4 g/dL (ref 13.0–17.0)
MCH: 31.1 pg (ref 26.0–34.0)
MCHC: 35.4 g/dL (ref 30.0–36.0)
MCV: 87.9 fL (ref 78.0–100.0)

## 2011-01-16 MED ORDER — HEPARIN SOD (PORCINE) IN D5W 100 UNIT/ML IV SOLN
1100.0000 [IU]/h | INTRAVENOUS | Status: DC
  Administered 2011-01-16: 1050 [IU]/h via INTRAVENOUS
  Administered 2011-01-17 – 2011-01-18 (×2): 1100 [IU]/h via INTRAVENOUS
  Filled 2011-01-16 (×2): qty 250

## 2011-01-16 NOTE — Progress Notes (Signed)
Patient ID: Jonathon [name], male   DOB: 1945/08/04, 65 y.o.   MRN: [id number] SUBJECTIVE:   Patient is stable today.  He is waiting for his Plavix to wash out so that surgery can be done.   Filed Vitals:   01/15/11 0640 01/15/11 1500 01/15/11 2225 01/16/11 0452  BP: 121/72 132/79 118/79 125/75  Pulse: 74 80 73 70  Temp: 98.4 F (36.9 C) 98 F (36.7 C) 98.2 F (36.8 C) 98.4 F (36.9 C)  TempSrc: Oral Oral Oral Oral  Resp: 12 18 18 18   Height:      Weight:      SpO2: 95% 96% 98% 98%   No intake or output data in the 24 hours ending 01/16/11 1117  LABS: Basic Metabolic Panel:  Basename 01/14/11 0258 01/13/11 2018  NA 140 139  K 4.0 3.5  CL 106 105  CO2 26 27  GLUCOSE 103* 122*  BUN 15 15  CREATININE 0.89 0.89  CALCIUM 8.4 8.8  MG -- --  PHOS -- --   Liver Function Tests:  [facility] 01/13/11 2018  AST 26  ALT 21  ALKPHOS 52  BILITOT 0.7  PROT 6.5  ALBUMIN 3.7   No results found for this basename: LIPASE:2,AMYLASE:2 in the last 72 hours CBC:  Basename 01/16/11 0530 01/15/11 0405  WBC 6.5 7.9  NEUTROABS -- --  HGB 14.4 14.9  HCT 40.7 42.2  MCV 87.9 88.1  PLT 137* 147*   Cardiac Enzymes:  Basename 01/14/11 0910 01/14/11 0258 01/13/11 2018  CKTOTAL 110 125 167  CKMB 6.2* 6.8* 8.5*  CKMBINDEX -- -- --  TROPONINI 1.17* 1.52* 1.20*   BNP: No results found for this basename: POCBNP:3 in the last 72 hours D-Dimer: No results found for this basename: DDIMER:2 in the last 72 hours Hemoglobin A1C: No results found for this basename: HGBA1C in the last 72 hours Fasting Lipid Panel:  Basename 01/14/11 0258  CHOL 191  HDL 35*  LDLCALC 132*  TRIG 120  CHOLHDL 5.5  LDLDIRECT --   Thyroid Function Tests:  Basename 01/13/11 2018  TSH 5.617*  T4TOTAL --  T3FREE --  THYROIDAB --    RADIOLOGY: Dg Chest 2 View  01/13/2011  *RADIOLOGY REPORT*  Clinical Data: Chest tightness, dizziness  CHEST - 2 VIEW  Comparison: None  Findings: Normal heart size,  mediastinal contours, and pulmonary vascularity. Lungs clear. Bones unremarkable. No pneumothorax.  IMPRESSION: No acute abnormalities.  Original Report Authenticated By: Lollie Marrow, M.D.    PHYSICAL EXAM     Patient is stable today.  I spoke with him and he is understanding about waiting for his Plavix to wash out  TELEMETRY:   Telemetry is reviewed.  The patient is in sinus rhythm.   ASSESSMENT AND PLAN:  Principal Problem:  *NSTEMI (non-ST elevated myocardial infarction)        The patient is scheduled for CABG.  His Plavix as washing out.  He is stable.  [name] 01/16/2011 11:17 AM

## 2011-01-16 NOTE — Progress Notes (Signed)
ANTICOAGULATION CONSULT NOTE - Follow Up Consult  Pharmacy Consult for Heparin Indication: chest pain/ACS  Allergies  Allergen Reactions  . Dilaudid (Hydromorphone Hcl) Hypertension    Back and forth with hyper and hypotension    Patient Measurements: Height: 5\' 10"  (177.8 cm) Weight: 193 lb 5.5 oz (87.7 kg) IBW/kg (Calculated) : 73    Vital Signs:    Labs:  Basename 01/16/11 1546 01/16/11 0530 01/15/11 0405 01/14/11 0910 01/14/11 0258 01/13/11 2018  HGB -- 14.4 14.9 -- -- --  HCT -- 40.7 42.2 -- -- --  PLT -- 137* 147* -- -- --  APTT -- -- -- -- -- --  LABPROT -- -- -- -- -- --  INR -- -- -- -- -- --  HEPARINUNFRC 0.25* 0.24* 0.45 -- -- --  CREATININE -- -- -- -- 0.89 0.89  CKTOTAL -- -- -- 110 125 167  CKMB -- -- -- 6.2* 6.8* 8.5*  TROPONINI -- -- -- 1.17* 1.52* 1.20*   Estimated Creatinine Clearance: 92.3 ml/min (by C-G formula based on Cr of 0.89).   Assessment: 65 y/o male patient receiving heparin infusion for CAD, plan for CABG. Subtherapeutic heparin level, low goal d/t groin hematoma, will try to adjust.  Goal of Therapy:  Xa=0.3-0.4   Plan:  Increase gtt to 1150 units/hr and f/u in am.  Verlene Mayer, PharmD, BCPS Pager [phone] 01/16/2011,6:00 PM

## 2011-01-16 NOTE — Progress Notes (Signed)
ANTICOAGULATION CONSULT NOTE - Follow Up Consult  Pharmacy Consult for Heparin  Indication: CAD s/p cath, plans for CABG after plavix washout   Allergies  Allergen Reactions  . Dilaudid (Hydromorphone Hcl) Hypertension    Back and forth with hyper and hypotension    Patient Measurements: Height: 5\' 10"  (177.8 cm) Weight: 193 lb 5.5 oz (87.7 kg) IBW/kg (Calculated) : 73   Vital Signs: Temp: 98.4 F (36.9 C) (12/02 0452) Temp src: Oral (12/02 0452) BP: 125/75 mmHg (12/02 0452) Pulse Rate: 70  (12/02 0452)  Labs:  Basename 01/16/11 0530 01/15/11 0405 01/14/11 2004 01/14/11 0910 01/14/11 0258 01/13/11 2018 01/13/11 1039  HGB 14.4 14.9 -- -- -- -- --  HCT 40.7 42.2 -- -- -- -- 45.0  PLT 137* 147* -- -- -- -- 160  APTT -- -- -- -- -- -- --  LABPROT -- -- -- -- -- -- 13.4  INR -- -- -- -- -- -- 1.00  HEPARINUNFRC 0.24* 0.45 0.61 -- -- -- --  CREATININE -- -- -- -- 0.89 0.89 1.03  CKTOTAL -- -- -- 110 125 167 --  CKMB -- -- -- 6.2* 6.8* 8.5* --  TROPONINI -- -- -- 1.17* 1.52* 1.20* --   Estimated Creatinine Clearance: 92.3 ml/min (by C-G formula based on Cr of 0.89).   Medications:  Scheduled:    . aspirin EC  81 mg Oral Daily   Goal of Therapy:  Heparin level 0.3 - 0.4 as per MD   Assessment:  Heparin level slightly below stated goal ( 0.3 - 0.4 ). Noted plans for CABG Weds 12/5. No complications noted.  CBC reveals slow trend down in platelets (=134).  WIll be changing to new heparin bag (to a premixed bag from a [facility] compounded bag) which may effect heparin levels.   Plan: Increase Heparin infusion to 1050units/hr.  Will recheck heparin level d/t change in heparin product.   Dannielle Huh 01/16/2011,7:50 AM

## 2011-01-17 ENCOUNTER — Inpatient Hospital Stay (HOSPITAL_COMMUNITY): Payer: BC Managed Care – PPO

## 2011-01-17 DIAGNOSIS — I10 Essential (primary) hypertension: Secondary | ICD-10-CM | POA: Diagnosis present

## 2011-01-17 DIAGNOSIS — I2510 Atherosclerotic heart disease of native coronary artery without angina pectoris: Secondary | ICD-10-CM | POA: Diagnosis present

## 2011-01-17 DIAGNOSIS — E785 Hyperlipidemia, unspecified: Secondary | ICD-10-CM | POA: Diagnosis present

## 2011-01-17 DIAGNOSIS — I214 Non-ST elevation (NSTEMI) myocardial infarction: Principal | ICD-10-CM

## 2011-01-17 LAB — CBC
HCT: 40.2 % (ref 39.0–52.0)
Hemoglobin: 14.2 g/dL (ref 13.0–17.0)
MCH: 30.9 pg (ref 26.0–34.0)
MCHC: 35.3 g/dL (ref 30.0–36.0)
MCV: 87.6 fL (ref 78.0–100.0)

## 2011-01-17 LAB — BASIC METABOLIC PANEL
CO2: 22 mEq/L (ref 19–32)
Calcium: 8.7 mg/dL (ref 8.4–10.5)
Glucose, Bld: 98 mg/dL (ref 70–99)
Sodium: 136 mEq/L (ref 135–145)

## 2011-01-17 LAB — HEPARIN LEVEL (UNFRACTIONATED): Heparin Unfractionated: 0.42 IU/mL (ref 0.30–0.70)

## 2011-01-17 MED ORDER — METOPROLOL TARTRATE 12.5 MG HALF TABLET
12.5000 mg | ORAL_TABLET | Freq: Two times a day (BID) | ORAL | Status: DC
  Administered 2011-01-17 – 2011-01-18 (×4): 12.5 mg via ORAL
  Filled 2011-01-17 (×7): qty 1

## 2011-01-17 MED ORDER — ROSUVASTATIN CALCIUM 40 MG PO TABS
40.0000 mg | ORAL_TABLET | Freq: Every day | ORAL | Status: DC
  Administered 2011-01-17 – 2011-01-24 (×7): 40 mg via ORAL
  Filled 2011-01-17 (×10): qty 1

## 2011-01-17 NOTE — Progress Notes (Signed)
Cardiology Progress Note Patient Name: Jonathon [name] Date of Encounter: 01/17/2011, 10:06 AM     Subjective  No overnight events. Patient feeling well this morning without complaints of chest pain or shortness of breath since admission. He has been ambulating the halls without difficulty. Reports a small amount of blood on the toilet paper this am after a bowel movement.   Objective   Telemetry: Sinus rhythm, 60s-100s  Medications: . aspirin EC  81 mg Oral Daily   Heparin drip   Continuous   Physical Exam: Filed Vitals:   01/17/11 0526  BP: 136/75  Pulse: 77  Temp: 98.4 F (36.9 C)  Resp: 18   General: Well developed, well nourished, elderly white male, in no acute distress. Head: Normocephalic, atraumatic, sclera non-icteric, nares are without discharge.  Neck: Supple. Negative for carotid bruits or JVD. Lungs: Clear bilaterally to auscultation without wheezes, rales, or rhonchi. Breathing is unlabored. Heart: RRR S1 S2 without murmurs, rubs, or gallops.  Abdomen: Soft, non-tender, non-distended with normoactive bowel sounds. . No rebound/guarding. No obvious abdominal masses. Msk:  Strength and tone appear normal for age. Extremities: No clubbing or cyanosis. No edema.  Distal pedal pulses are 2+ and equal bilaterally. Neuro: Alert and oriented X 3. Moves all extremities spontaneously. Psych:  Responds to questions appropriately with a normal affect.   Intake/Output Summary (Last 24 hours) at 01/17/11 1006 Last data filed at 01/16/11 1300  Gross per 24 hour  Intake    240 ml  Output      0 ml  Net    240 ml    Labs:  Basename 01/17/11 0500  NA 136  K 3.7  CL 103  CO2 22  GLUCOSE 98  BUN 16  CREATININE 0.94  CALCIUM 8.7   Basename 01/17/11 0500 01/16/11 0530  WBC 6.1 6.5  HGB 14.2 14.4  HCT 40.2 40.7  MCV 87.6 87.9  PLT 127* 137*    01/13/2011 10:39 01/13/2011 14:44 01/13/2011 20:18 01/14/2011 02:58 01/14/2011 09:10  CK, MB 8.2 (HH) 9.9 (HH)  8.5 (HH) 6.8 (HH) 6.2 (HH)  CK Total 202 199 167 125 110  Troponin I 0.45 (HH) 1.25 (HH) 1.20 (HH) 1.52 (HH) 1.17 (HH)    01/14/2011 02:58  Cholesterol 191  Triglycerides 120  HDL 35 (L)  LDL (calc) 132 (H)  VLDL 24  Total CHOL/HDL Ratio 5.5    01/13/2011 20:18  TSH 5.617 (H)    Radiology/Studies:   01/14/11 - 2D Echocardiogram Left ventricle: Technically limited study. The cavity size was normal. Wall thickness was increased in a pattern of mild LVH. The estimated ejection fraction was 60%. Regional wall motion abnormalities cannot be excluded.  Dg Chest 2 View 01/13/2011  Findings: Normal heart size, mediastinal contours, and pulmonary vascularity. Lungs clear. Bones unremarkable. No pneumothorax.  IMPRESSION: No acute abnormalities.     Assessment and Plan  65yom with remote history of HTN admitted with NSTEMI and now newly diagnosed severe three-vessel coronary artery disease who is planned for a CABG.  1. NSTEMI/CAD - Patient is chest pain free. He last received Plavix on 01/14/11 and is being washed out for planned CABG on 01/18/11. On ASA and heparin drip. Will add low dose metoprolol as well Crestor. 2. HTN - Stable. SBP 120s-130s. Meds as above. 3. HLD - LDL 132 on admission. Meds as above. 4. Elevated TSH - Will recheck TSH in the AM as well as Free T4.  Signed, Kanchan Gal PA-C  History reviewed with the patient, no changes to be made.  The patient exam reveals no wheezing, or crackles,  RRR no murmur, no edema.  All available labs, radiology testing, previous records reviewed. Agree with documented assessment and plan. Start low dose beta blocker.  Check T3/T4.  TSH mildly abnormal.  James Hochrein  10:30 AM 12/31/2010

## 2011-01-17 NOTE — Progress Notes (Signed)
ANTICOAGULATION CONSULT NOTE - Follow Up Consult  Pharmacy Consult for Heparin Indication: chest pain/ACS  Allergies  Allergen Reactions  . Dilaudid (Hydromorphone Hcl) Hypertension    Back and forth with hyper and hypotension    Patient Measurements: Height: 5\' 10"  (177.8 cm) Weight: 193 lb 5.5 oz (87.7 kg) IBW/kg (Calculated) : 73    Vital Signs: Temp: 98.4 F (36.9 C) (12/03 0526) Temp src: Oral (12/02 2149) BP: 136/75 mmHg (12/03 0526) Pulse Rate: 77  (12/03 0526)  Labs:  Basename 01/17/11 0500 01/16/11 1546 01/16/11 0530 01/15/11 0405 01/14/11 0910  HGB 14.2 -- 14.4 -- --  HCT 40.2 -- 40.7 42.2 --  PLT 127* -- 137* 147* --  APTT -- -- -- -- --  LABPROT -- -- -- -- --  INR -- -- -- -- --  HEPARINUNFRC 0.42 0.25* 0.24* -- --  CREATININE -- -- -- -- --  CKTOTAL -- -- -- -- 110  CKMB -- -- -- -- 6.2*  TROPONINI -- -- -- -- 1.17*   Estimated Creatinine Clearance: 92.3 ml/min (by C-G formula based on Cr of 0.89).   Assessment: 65 y/o male patient receiving heparin infusion for CAD, plan for CABG.  Goal of Therapy:  Xa=0.3-0.4   Plan:  Decrease Heparin 1100 units/hr  [name], PharmD, BCPS 01/17/2011,6:11 AM

## 2011-01-18 LAB — CBC
HCT: 39.7 % (ref 39.0–52.0)
Hemoglobin: 14.2 g/dL (ref 13.0–17.0)
MCHC: 35.8 g/dL (ref 30.0–36.0)

## 2011-01-18 LAB — T4, FREE: Free T4: 0.94 ng/dL (ref 0.80–1.80)

## 2011-01-18 LAB — HEPARIN LEVEL (UNFRACTIONATED): Heparin Unfractionated: 0.33 IU/mL (ref 0.30–0.70)

## 2011-01-18 LAB — TYPE AND SCREEN: Antibody Screen: NEGATIVE

## 2011-01-18 MED ORDER — VANCOMYCIN HCL 1000 MG IV SOLR
1500.0000 mg | INTRAVENOUS | Status: AC
  Administered 2011-01-19: 1500 mg via INTRAVENOUS
  Filled 2011-01-18: qty 1500

## 2011-01-18 MED ORDER — POTASSIUM CHLORIDE 2 MEQ/ML IV SOLN
80.0000 meq | INTRAVENOUS | Status: DC
  Filled 2011-01-18: qty 40

## 2011-01-18 MED ORDER — HEPARIN SOD (PORCINE) IN D5W 100 UNIT/ML IV SOLN
1100.0000 [IU]/h | INTRAVENOUS | Status: DC
  Filled 2011-01-18 (×2): qty 250

## 2011-01-18 MED ORDER — MAGNESIUM SULFATE 50 % IJ SOLN
40.0000 meq | INTRAMUSCULAR | Status: DC
  Filled 2011-01-18: qty 10

## 2011-01-18 MED ORDER — CHLORHEXIDINE GLUCONATE 4 % EX LIQD
60.0000 mL | Freq: Once | CUTANEOUS | Status: AC
  Administered 2011-01-19: 4 via TOPICAL
  Filled 2011-01-18: qty 60

## 2011-01-18 MED ORDER — PLASMA-LYTE 148 IV SOLN
INTRAVENOUS | Status: AC
  Administered 2011-01-19: 11:00:00
  Filled 2011-01-18: qty 0.5

## 2011-01-18 MED ORDER — EPINEPHRINE HCL 1 MG/ML IJ SOLN
0.5000 ug/min | INTRAVENOUS | Status: DC
  Filled 2011-01-18: qty 4

## 2011-01-18 MED ORDER — PHENYLEPHRINE HCL 10 MG/ML IJ SOLN
30.0000 ug/min | INTRAVENOUS | Status: DC
  Filled 2011-01-18: qty 2

## 2011-01-18 MED ORDER — NITROGLYCERIN IN D5W 200-5 MCG/ML-% IV SOLN
2.0000 ug/min | INTRAVENOUS | Status: AC
  Administered 2011-01-19: 10 ug/min via INTRAVENOUS
  Filled 2011-01-18: qty 250

## 2011-01-18 MED ORDER — DEXTROSE 5 % IV SOLN
750.0000 mg | INTRAVENOUS | Status: DC
  Filled 2011-01-18 (×2): qty 750

## 2011-01-18 MED ORDER — BISACODYL 5 MG PO TBEC
5.0000 mg | DELAYED_RELEASE_TABLET | Freq: Once | ORAL | Status: DC
  Filled 2011-01-18: qty 1

## 2011-01-18 MED ORDER — SODIUM CHLORIDE 0.9 % IV SOLN
0.1000 ug/kg/h | INTRAVENOUS | Status: AC
  Administered 2011-01-19: .3 ug/kg/h via INTRAVENOUS
  Filled 2011-01-18: qty 4

## 2011-01-18 MED ORDER — SODIUM CHLORIDE 0.9 % IV SOLN
INTRAVENOUS | Status: AC
  Administered 2011-01-19: 1.7 [IU]/h via INTRAVENOUS
  Filled 2011-01-18: qty 1

## 2011-01-18 MED ORDER — DEXTROSE 5 % IV SOLN
1.5000 g | INTRAVENOUS | Status: AC
  Administered 2011-01-19: .75 g via INTRAVENOUS
  Administered 2011-01-19: 1.5 g via INTRAVENOUS
  Filled 2011-01-18: qty 1.5

## 2011-01-18 MED ORDER — DOPAMINE-DEXTROSE 3.2-5 MG/ML-% IV SOLN
2.0000 ug/kg/min | INTRAVENOUS | Status: DC
  Filled 2011-01-18: qty 250

## 2011-01-18 MED ORDER — CHLORHEXIDINE GLUCONATE 4 % EX LIQD
60.0000 mL | Freq: Once | CUTANEOUS | Status: AC
  Administered 2011-01-18: 4 via TOPICAL
  Filled 2011-01-18: qty 60

## 2011-01-18 MED ORDER — CHLORHEXIDINE GLUCONATE 4 % EX LIQD
60.0000 mL | Freq: Once | CUTANEOUS | Status: DC
  Filled 2011-01-18 (×2): qty 60

## 2011-01-18 MED ORDER — TEMAZEPAM 15 MG PO CAPS
15.0000 mg | ORAL_CAPSULE | Freq: Once | ORAL | Status: AC | PRN
  Administered 2011-01-18: 15 mg via ORAL
  Filled 2011-01-18: qty 1

## 2011-01-18 MED ORDER — METOPROLOL TARTRATE 12.5 MG HALF TABLET
12.5000 mg | ORAL_TABLET | Freq: Once | ORAL | Status: AC
  Administered 2011-01-19: 12.5 mg via ORAL
  Filled 2011-01-18: qty 1

## 2011-01-18 MED ORDER — SODIUM CHLORIDE 0.9 % IV SOLN
INTRAVENOUS | Status: DC
  Filled 2011-01-18: qty 40

## 2011-01-18 NOTE — Progress Notes (Signed)
Pt. Education material for CABG in am given. Pt and wife watched video and understood the material.

## 2011-01-18 NOTE — Progress Notes (Addendum)
ANTICOAGULATION CONSULT NOTE - Follow Up Consult  Pharmacy Consult for Heparin Indication: chest pain/ACS  Allergies  Allergen Reactions  . Dilaudid (Hydromorphone Hcl) Hypertension    Back and forth with hyper and hypotension    Patient Measurements: Height: 5\' 10"  (177.8 cm) Weight: 193 lb 5.5 oz (87.7 kg) IBW/kg (Calculated) : 73   Vital Signs: Temp: 97.7 F (36.5 C) (12/04 0635) Temp src: Oral (12/04 0635) BP: 132/82 mmHg (12/04 0635) Pulse Rate: 82  (12/04 0635)  Labs:  Basename 01/18/11 0605 01/17/11 0500 01/16/11 1546 01/16/11 0530  HGB 14.2 14.2 -- --  HCT 39.7 40.2 -- 40.7  PLT 145* 127* -- 137*  APTT -- -- -- --  LABPROT -- -- -- --  INR -- -- -- --  HEPARINUNFRC 0.33 0.42 0.25* --  CREATININE -- 0.94 -- --  CKTOTAL -- -- -- --  CKMB -- -- -- --  TROPONINI -- -- -- --   Estimated Creatinine Clearance: 87.4 ml/min (by C-G formula based on Cr of 0.94).   Medications:  Scheduled:    . aspirin EC  81 mg Oral Daily  . metoprolol tartrate  12.5 mg Oral BID  . rosuvastatin  40 mg Oral Daily    Assessment: 65 yo M on heparin for 3V CAD awaiting CABG 12/5. Heparin is therapeutic this am. No bleeding noted but had previous mild groin hematoma.   P2Y12 PRU value 135 on 12/3 - platelets still adequately inhibited.  Goal of Therapy:  heparin level 0.3 - 0.4 due to mild groin hematoma   Plan:  1. Continue heparin at 1100 units/hr (11 ml/hr) 2. Consider rechecking P2Y12 test tomorrow to ensure Plavix washout 3. Continue to watch for s/sx of bleeding  Lovell Sheehan 01/18/2011,9:03 AM

## 2011-01-18 NOTE — Progress Notes (Signed)
    Patient Name: Jonathon [name] Date of Encounter: 01/18/2011, 6:22 AM     Subjective  No overnight events. Denies chest pain or SOB.   Objective    Physical Exam: Filed Vitals:   01/17/11 2137  BP: 144/93  Pulse: 85  Temp: 98.2 F (36.8 C)  Resp: 18   General: Well developed, well nourished, elderly white male, in no acute distress. Lungs: Clear bilaterally to auscultation without wheezes, rales, or rhonchi. Breathing is unlabored. Heart: RRR S1 S2 without murmurs, rubs, or gallops.  Abdomen: Soft, non-tender, non-distended with normoactive bowel sounds. . No rebound/guarding. No obvious abdominal masses. Extremities: No clubbing or cyanosis. No edema.  Distal pedal pulses are 2+ and equal bilaterally. Neuro: Alert and oriented X 3. Moves all extremities spontaneously.   Intake/Output Summary (Last 24 hours) at 01/18/11 0622 Last data filed at 01/17/11 1901  Gross per 24 hour  Intake    960 ml  Output   2901 ml  Net  -1941 ml    Labs:  Basename 01/17/11 0500  NA 136  K 3.7  CL 103  CO2 22  GLUCOSE 98  BUN 16  CREATININE 0.94  CALCIUM 8.7   Basename 01/17/11 0500 01/16/11 0530  WBC 6.1 6.5  HGB 14.2 14.4  HCT 40.2 40.7  MCV 87.6 87.9  PLT 127* 137*    01/13/2011 20:18  TSH 5.617 (H)     Assessment and Plan  65yom with remote history of HTN admitted with NSTEMI and now newly diagnosed severe three-vessel coronary artery disease who is planned for a CABG.  1. NSTEMI/CAD - Patient is chest pain free. CABG on 01/18/11. On ASA and heparin drip. Will add low dose metoprolol as well Crestor. 2. HTN - Stable. SBP 120s-130s. Meds as above. 3. HLD - LDL 132 on admission. Meds as above. 4. Elevated TSH - Will recheck TSH in the AM as well as Free T4.  Signed, Rollene Rotunda PA-C

## 2011-01-19 ENCOUNTER — Inpatient Hospital Stay (HOSPITAL_COMMUNITY): Payer: BC Managed Care – PPO

## 2011-01-19 ENCOUNTER — Encounter (HOSPITAL_COMMUNITY)
Admission: EM | Disposition: A | Discharge status: Home / Self Care | Payer: Self-pay | Source: Home / Self Care | Attending: Cardiology

## 2011-01-19 ENCOUNTER — Encounter (HOSPITAL_COMMUNITY): Payer: Self-pay

## 2011-01-19 ENCOUNTER — Other Ambulatory Visit: Payer: Self-pay

## 2011-01-19 DIAGNOSIS — I2510 Atherosclerotic heart disease of native coronary artery without angina pectoris: Secondary | ICD-10-CM

## 2011-01-19 HISTORY — PX: CORONARY ARTERY BYPASS GRAFT: SHX141

## 2011-01-19 LAB — POCT I-STAT, CHEM 8
Creatinine, Ser: 0.9 mg/dL (ref 0.50–1.35)
Glucose, Bld: 134 mg/dL — ABNORMAL HIGH (ref 70–99)
Hemoglobin: 10.9 g/dL — ABNORMAL LOW (ref 13.0–17.0)
Sodium: 140 mEq/L (ref 135–145)
TCO2: 23 mmol/L (ref 0–100)

## 2011-01-19 LAB — POCT I-STAT 3, ART BLOOD GAS (G3+)
Acid-Base Excess: 1 mmol/L (ref 0.0–2.0)
Acid-Base Excess: 2 mmol/L (ref 0.0–2.0)
Acid-base deficit: 1 mmol/L (ref 0.0–2.0)
Bicarbonate: 26 mEq/L — ABNORMAL HIGH (ref 20.0–24.0)
O2 Saturation: 100 %
O2 Saturation: 100 %
O2 Saturation: 99 %
O2 Saturation: 99 %
O2 Saturation: 99 %
Patient temperature: 98.5
TCO2: 27 mmol/L (ref 0–100)
TCO2: 24 mmol/L (ref 0–100)
pCO2 arterial: 37.2 mmHg (ref 35.0–45.0)
pCO2 arterial: 41.7 mmHg (ref 35.0–45.0)
pCO2 arterial: 47.2 mmHg — ABNORMAL HIGH (ref 35.0–45.0)
pCO2 arterial: 47.4 mmHg — ABNORMAL HIGH (ref 35.0–45.0)
pH, Arterial: 7.438 (ref 7.350–7.450)
pH, Arterial: 7.454 — ABNORMAL HIGH (ref 7.350–7.450)
pH, Arterial: 7.332 — ABNORMAL LOW (ref 7.350–7.450)
pO2, Arterial: 443 mmHg — ABNORMAL HIGH (ref 80.0–100.0)
pO2, Arterial: 300 mmHg — ABNORMAL HIGH (ref 80.0–100.0)
pO2, Arterial: 309 mmHg — ABNORMAL HIGH (ref 80.0–100.0)
pO2, Arterial: 127 mmHg — ABNORMAL HIGH (ref 80.0–100.0)
pO2, Arterial: 130 mmHg — ABNORMAL HIGH (ref 80.0–100.0)

## 2011-01-19 LAB — CBC
HCT: 40.8 % (ref 39.0–52.0)
HCT: 32 % — ABNORMAL LOW (ref 39.0–52.0)
HCT: 29.1 % — ABNORMAL LOW (ref 39.0–52.0)
Hemoglobin: 11.5 g/dL — ABNORMAL LOW (ref 13.0–17.0)
MCHC: 35.7 g/dL (ref 30.0–36.0)
MCV: 86.3 fL (ref 78.0–100.0)
Platelets: 148 10*3/uL — ABNORMAL LOW (ref 150–400)
RBC: 4.71 MIL/uL (ref 4.22–5.81)
RDW: 12.3 % (ref 11.5–15.5)
RDW: 12.4 % (ref 11.5–15.5)
RDW: 12.5 % (ref 11.5–15.5)
WBC: 7.3 10*3/uL (ref 4.0–10.5)
WBC: 7.5 10*3/uL (ref 4.0–10.5)

## 2011-01-19 LAB — POCT I-STAT 4, (NA,K, GLUC, HGB,HCT)
Glucose, Bld: 82 mg/dL (ref 70–99)
Glucose, Bld: 92 mg/dL (ref 70–99)
Glucose, Bld: 110 mg/dL — ABNORMAL HIGH (ref 70–99)
HCT: 37 % — ABNORMAL LOW (ref 39.0–52.0)
HCT: 26 % — ABNORMAL LOW (ref 39.0–52.0)
HCT: 28 % — ABNORMAL LOW (ref 39.0–52.0)
HCT: 32 % — ABNORMAL LOW (ref 39.0–52.0)
Hemoglobin: 11.6 g/dL — ABNORMAL LOW (ref 13.0–17.0)
Hemoglobin: 8.8 g/dL — ABNORMAL LOW (ref 13.0–17.0)
Hemoglobin: 9.5 g/dL — ABNORMAL LOW (ref 13.0–17.0)
Potassium: 4.1 mEq/L (ref 3.5–5.1)
Potassium: 4.4 mEq/L (ref 3.5–5.1)
Potassium: 4.8 mEq/L (ref 3.5–5.1)
Potassium: 4.4 mEq/L (ref 3.5–5.1)
Potassium: 4.2 mEq/L (ref 3.5–5.1)
Sodium: 140 mEq/L (ref 135–145)
Sodium: 134 mEq/L — ABNORMAL LOW (ref 135–145)
Sodium: 136 mEq/L (ref 135–145)
Sodium: 138 mEq/L (ref 135–145)

## 2011-01-19 LAB — GLUCOSE, CAPILLARY
Glucose-Capillary: 115 mg/dL — ABNORMAL HIGH (ref 70–99)
Glucose-Capillary: 95 mg/dL (ref 70–99)
Glucose-Capillary: 91 mg/dL (ref 70–99)

## 2011-01-19 LAB — BASIC METABOLIC PANEL
BUN: 16 mg/dL (ref 6–23)
CO2: 26 mEq/L (ref 19–32)
Chloride: 101 mEq/L (ref 96–112)
GFR calc Af Amer: 88 mL/min — ABNORMAL LOW (ref >90)
Potassium: 3.4 mEq/L — ABNORMAL LOW (ref 3.5–5.1)

## 2011-01-19 LAB — PLATELET COUNT: Platelets: 126 10*3/uL — ABNORMAL LOW (ref 150–400)

## 2011-01-19 LAB — HEMOGLOBIN A1C: Hgb A1c MFr Bld: 5.3 % (ref <5.7)

## 2011-01-19 LAB — APTT
aPTT: 85 seconds — ABNORMAL HIGH (ref 24–37)
aPTT: 35 seconds (ref 24–37)

## 2011-01-19 LAB — MAGNESIUM: Magnesium: 3 mg/dL — ABNORMAL HIGH (ref 1.5–2.5)

## 2011-01-19 LAB — PROTIME-INR
INR: 1.04 (ref 0.00–1.49)
INR: 1.31 (ref 0.00–1.49)
Prothrombin Time: 13.8 seconds (ref 11.6–15.2)

## 2011-01-19 LAB — CREATININE, SERUM
Creatinine, Ser: 0.87 mg/dL (ref 0.50–1.35)
GFR calc Af Amer: >90 mL/min (ref >90)

## 2011-01-19 LAB — HEPARIN LEVEL (UNFRACTIONATED): Heparin Unfractionated: 0.49 IU/mL (ref 0.30–0.70)

## 2011-01-19 SURGERY — CORONARY ARTERY BYPASS GRAFTING (CABG)
Anesthesia: General | Site: Chest | Wound class: Clean

## 2011-01-19 MED ORDER — MIDAZOLAM HCL 2 MG/2ML IJ SOLN: 2.0000 mg | INTRAMUSCULAR | Status: DC | PRN

## 2011-01-19 MED ORDER — METOPROLOL TARTRATE 25 MG/10 ML ORAL SUSPENSION
12.5000 mg | Freq: Two times a day (BID) | ORAL | Status: DC
  Filled 2011-01-19 (×3): qty 5

## 2011-01-19 MED ORDER — LACTATED RINGERS IV SOLN: INTRAVENOUS | Status: DC

## 2011-01-19 MED ORDER — DEXMEDETOMIDINE HCL 100 MCG/ML IV SOLN
0.1000 ug/kg/h | INTRAVENOUS | Status: DC
  Filled 2011-01-19: qty 2

## 2011-01-19 MED ORDER — THROMBIN 20000 UNITS EX KIT
PACK | CUTANEOUS | Status: DC | PRN
  Administered 2011-01-19: 20000 [IU] via TOPICAL

## 2011-01-19 MED ORDER — THROMBIN 20000 UNITS EX KIT
PACK | OROMUCOSAL | Status: DC | PRN
  Administered 2011-01-19: 11:00:00 via TOPICAL

## 2011-01-19 MED ORDER — SODIUM CHLORIDE 0.9 % IR SOLN
Status: DC | PRN
  Administered 2011-01-19: 1000 mL

## 2011-01-19 MED ORDER — OXYCODONE HCL 5 MG PO TABS
5.0000 mg | ORAL_TABLET | ORAL | Status: DC | PRN
  Administered 2011-01-19 – 2011-01-20 (×3): 5 mg via ORAL
  Filled 2011-01-19 (×3): qty 1

## 2011-01-19 MED ORDER — ACETAMINOPHEN 500 MG PO TABS
1000.0000 mg | ORAL_TABLET | Freq: Four times a day (QID) | ORAL | Status: DC
  Administered 2011-01-19 – 2011-01-20 (×5): 1000 mg via ORAL
  Filled 2011-01-19 (×7): qty 2

## 2011-01-19 MED ORDER — PAPAVERINE HCL 30 MG/ML IJ SOLN
INTRAMUSCULAR | Status: DC | PRN
  Administered 2011-01-19: 60 mg

## 2011-01-19 MED ORDER — VECURONIUM BROMIDE 10 MG IV SOLR
INTRAVENOUS | Status: DC | PRN
  Administered 2011-01-19 (×2): 10 mg via INTRAVENOUS

## 2011-01-19 MED ORDER — SODIUM CHLORIDE 0.9 % IJ SOLN: 3.0000 mL | INTRAMUSCULAR | Status: DC | PRN

## 2011-01-19 MED ORDER — FAMOTIDINE IN NACL 20-0.9 MG/50ML-% IV SOLN
20.0000 mg | Freq: Two times a day (BID) | INTRAVENOUS | Status: DC
  Administered 2011-01-19: 20 mg via INTRAVENOUS

## 2011-01-19 MED ORDER — FENTANYL CITRATE 0.05 MG/ML IJ SOLN
INTRAMUSCULAR | Status: DC | PRN
  Administered 2011-01-19 (×2): 150 ug via INTRAVENOUS
  Administered 2011-01-19: 250 ug via INTRAVENOUS
  Administered 2011-01-19: 100 ug via INTRAVENOUS
  Administered 2011-01-19 (×2): 50 ug via INTRAVENOUS
  Administered 2011-01-19: 150 ug via INTRAVENOUS
  Administered 2011-01-19 (×2): 100 ug via INTRAVENOUS
  Administered 2011-01-19: 250 ug via INTRAVENOUS
  Administered 2011-01-19 (×2): 50 ug via INTRAVENOUS
  Administered 2011-01-19: 100 ug via INTRAVENOUS
  Administered 2011-01-19: 50 ug via INTRAVENOUS
  Administered 2011-01-19: 250 ug via INTRAVENOUS
  Administered 2011-01-19: 50 ug via INTRAVENOUS
  Administered 2011-01-19: 100 ug via INTRAVENOUS
  Administered 2011-01-19: 250 ug via INTRAVENOUS
  Administered 2011-01-19: 100 ug via INTRAVENOUS
  Administered 2011-01-19: 50 ug via INTRAVENOUS
  Administered 2011-01-19: 100 ug via INTRAVENOUS

## 2011-01-19 MED ORDER — SODIUM CHLORIDE 0.9 % IJ SOLN
3.0000 mL | Freq: Two times a day (BID) | INTRAMUSCULAR | Status: DC
  Administered 2011-01-20: 3 mL via INTRAVENOUS

## 2011-01-19 MED ORDER — VANCOMYCIN HCL 1000 MG IV SOLR
1000.0000 mg | Freq: Once | INTRAVENOUS | Status: AC
  Administered 2011-01-19: 1000 mg via INTRAVENOUS
  Filled 2011-01-19: qty 1000

## 2011-01-19 MED ORDER — PROTAMINE SULFATE 10 MG/ML IV SOLN
INTRAVENOUS | Status: DC | PRN
  Administered 2011-01-19 (×5): 50 mg via INTRAVENOUS

## 2011-01-19 MED ORDER — LACTATED RINGERS IV SOLN
INTRAVENOUS | Status: DC | PRN
  Administered 2011-01-19: 08:00:00 via INTRAVENOUS

## 2011-01-19 MED ORDER — SODIUM CHLORIDE 0.45 % IV SOLN: INTRAVENOUS | Status: DC

## 2011-01-19 MED ORDER — PANTOPRAZOLE SODIUM 40 MG PO TBEC: 40.0000 mg | DELAYED_RELEASE_TABLET | Freq: Every day | ORAL | Status: DC

## 2011-01-19 MED ORDER — ACETAMINOPHEN 650 MG RE SUPP
650.0000 mg | RECTAL | Status: AC
  Administered 2011-01-19: 650 mg via RECTAL

## 2011-01-19 MED ORDER — ROCURONIUM BROMIDE 100 MG/10ML IV SOLN
INTRAVENOUS | Status: DC | PRN
  Administered 2011-01-19: 50 mg via INTRAVENOUS

## 2011-01-19 MED ORDER — BISACODYL 5 MG PO TBEC
10.0000 mg | DELAYED_RELEASE_TABLET | Freq: Every day | ORAL | Status: DC
  Administered 2011-01-20: 10 mg via ORAL
  Filled 2011-01-19: qty 2

## 2011-01-19 MED ORDER — INSULIN ASPART 100 UNIT/ML ~~LOC~~ SOLN
0.0000 [IU] | SUBCUTANEOUS | Status: AC
  Filled 2011-01-19: qty 3

## 2011-01-19 MED ORDER — ACETAMINOPHEN 160 MG/5ML PO SOLN: 650.0000 mg | ORAL | Status: AC

## 2011-01-19 MED ORDER — SODIUM CHLORIDE 0.9 % IV SOLN
10.0000 g | INTRAVENOUS | Status: DC | PRN
  Administered 2011-01-19: 5 g/h via INTRAVENOUS

## 2011-01-19 MED ORDER — DOCUSATE SODIUM 100 MG PO CAPS
200.0000 mg | ORAL_CAPSULE | Freq: Every day | ORAL | Status: DC
  Administered 2011-01-20: 200 mg via ORAL
  Filled 2011-01-19: qty 2

## 2011-01-19 MED ORDER — MORPHINE SULFATE 4 MG/ML IJ SOLN
2.0000 mg | INTRAMUSCULAR | Status: DC | PRN
  Administered 2011-01-20: 4 mg via INTRAVENOUS
  Filled 2011-01-19: qty 1

## 2011-01-19 MED ORDER — DEXTROSE 5 % IV SOLN
0.0000 ug/min | INTRAVENOUS | Status: DC
  Filled 2011-01-19: qty 2

## 2011-01-19 MED ORDER — HEPARIN SODIUM (PORCINE) 1000 UNIT/ML IJ SOLN
INTRAMUSCULAR | Status: DC | PRN
  Administered 2011-01-19: 29000 [IU] via INTRAVENOUS

## 2011-01-19 MED ORDER — ASPIRIN EC 325 MG PO TBEC
325.0000 mg | DELAYED_RELEASE_TABLET | Freq: Every day | ORAL | Status: DC
  Administered 2011-01-20: 325 mg via ORAL
  Filled 2011-01-19: qty 1

## 2011-01-19 MED ORDER — SODIUM CHLORIDE 0.9 % IV SOLN: INTRAVENOUS | Status: DC

## 2011-01-19 MED ORDER — ASPIRIN 81 MG PO CHEW: 324.0000 mg | CHEWABLE_TABLET | Freq: Every day | ORAL | Status: DC

## 2011-01-19 MED ORDER — ACETAMINOPHEN 160 MG/5ML PO SOLN
975.0000 mg | Freq: Four times a day (QID) | ORAL | Status: DC
  Filled 2011-01-19: qty 40.6

## 2011-01-19 MED ORDER — METOPROLOL TARTRATE 1 MG/ML IV SOLN: 2.5000 mg | INTRAVENOUS | Status: DC | PRN

## 2011-01-19 MED ORDER — PHENYLEPHRINE HCL 10 MG/ML IJ SOLN
INTRAMUSCULAR | Status: DC | PRN
  Administered 2011-01-19 (×4): 40 ug via INTRAVENOUS

## 2011-01-19 MED ORDER — MIDAZOLAM HCL 5 MG/5ML IJ SOLN
INTRAMUSCULAR | Status: DC | PRN
  Administered 2011-01-19 (×2): 2 mg via INTRAVENOUS
  Administered 2011-01-19: 4 mg via INTRAVENOUS
  Administered 2011-01-19: 2 mg via INTRAVENOUS

## 2011-01-19 MED ORDER — SODIUM CHLORIDE 0.9 % IV SOLN
10.0000 g | INTRAVENOUS | Status: DC | PRN
  Administered 2011-01-19: 09:00:00 via INTRAVENOUS

## 2011-01-19 MED ORDER — PROPOFOL 10 MG/ML IV EMUL
INTRAVENOUS | Status: DC | PRN
  Administered 2011-01-19: 160 mg via INTRAVENOUS

## 2011-01-19 MED ORDER — POTASSIUM CHLORIDE 10 MEQ/50ML IV SOLN: 10.0000 meq | INTRAVENOUS | Status: AC

## 2011-01-19 MED ORDER — BISACODYL 10 MG RE SUPP: 10.0000 mg | Freq: Every day | RECTAL | Status: DC

## 2011-01-19 MED ORDER — LACTATED RINGERS IV SOLN
INTRAVENOUS | Status: DC | PRN
Start: 2011-01-19 — End: 2011-01-19
  Administered 2011-01-19 (×2): via INTRAVENOUS

## 2011-01-19 MED ORDER — METOPROLOL TARTRATE 12.5 MG HALF TABLET
12.5000 mg | ORAL_TABLET | Freq: Two times a day (BID) | ORAL | Status: DC
  Filled 2011-01-19 (×3): qty 1

## 2011-01-19 MED ORDER — MORPHINE SULFATE 2 MG/ML IJ SOLN: 1.0000 mg | INTRAMUSCULAR | Status: DC | PRN

## 2011-01-19 MED ORDER — LACTATED RINGERS IV SOLN: 500.0000 mL | Freq: Once | INTRAVENOUS | Status: AC | PRN

## 2011-01-19 MED ORDER — NITROGLYCERIN IN D5W 200-5 MCG/ML-% IV SOLN: 0.0000 ug/min | INTRAVENOUS | Status: DC

## 2011-01-19 MED ORDER — ONDANSETRON HCL 4 MG/2ML IJ SOLN
4.0000 mg | Freq: Four times a day (QID) | INTRAMUSCULAR | Status: DC | PRN
  Filled 2011-01-19: qty 2

## 2011-01-19 MED ORDER — INSULIN ASPART 100 UNIT/ML ~~LOC~~ SOLN
0.0000 [IU] | SUBCUTANEOUS | Status: DC
  Administered 2011-01-20 (×3): 2 [IU] via SUBCUTANEOUS

## 2011-01-19 MED ORDER — DEXTROSE 5 % IV SOLN
1.5000 g | Freq: Two times a day (BID) | INTRAVENOUS | Status: DC
  Administered 2011-01-19 – 2011-01-20 (×3): 1.5 g via INTRAVENOUS
  Filled 2011-01-19 (×5): qty 1.5

## 2011-01-19 MED ORDER — MAGNESIUM SULFATE 40 MG/ML IJ SOLN
INTRAMUSCULAR | Status: AC
  Administered 2011-01-19: 4 g
  Filled 2011-01-19: qty 100

## 2011-01-19 MED ORDER — SODIUM CHLORIDE 0.9 % IV SOLN
INTRAVENOUS | Status: DC | PRN
  Administered 2011-01-19: 12:00:00 via INTRAVENOUS

## 2011-01-19 MED ORDER — ALBUMIN HUMAN 5 % IV SOLN
250.0000 mL | INTRAVENOUS | Status: DC | PRN
  Administered 2011-01-19 (×2): 250 mL via INTRAVENOUS
  Filled 2011-01-19: qty 250

## 2011-01-19 MED ORDER — SODIUM CHLORIDE 0.9 % IV SOLN
250.0000 mL | INTRAVENOUS | Status: DC
  Administered 2011-01-20: 250 mL via INTRAVENOUS

## 2011-01-19 MED ORDER — SODIUM CHLORIDE 0.9 % IV SOLN
INTRAVENOUS | Status: DC
  Filled 2011-01-19: qty 1

## 2011-01-19 MED ORDER — MAGNESIUM SULFATE 50 % IJ SOLN
4.0000 g | Freq: Once | INTRAVENOUS | Status: AC
  Administered 2011-01-19: 4 g via INTRAVENOUS
  Filled 2011-01-19: qty 8

## 2011-01-19 SURGICAL SUPPLY — 111 items
ADAPTER CARDIO PERF ANTE/RETRO (ADAPTER) IMPLANT
ADPR PRFSN 84XANTGRD RTRGD (ADAPTER)
APL SKNCLS STERI-STRIP NONHPOA (GAUZE/BANDAGES/DRESSINGS) ×1
ATTRACTOMAT 16X20 MAGNETIC DRP (DRAPES) ×2 IMPLANT
BAG DECANTER FOR FLEXI CONT (MISCELLANEOUS) ×2 IMPLANT
BANDAGE ELASTIC 4 VELCRO ST LF (GAUZE/BANDAGES/DRESSINGS) ×2 IMPLANT
BANDAGE ELASTIC 6 VELCRO ST LF (GAUZE/BANDAGES/DRESSINGS) ×2 IMPLANT
BANDAGE GAUZE ELAST BULKY 4 IN (GAUZE/BANDAGES/DRESSINGS) ×2 IMPLANT
BASKET HEART (ORDER IN 25'S) (MISCELLANEOUS) ×1
BASKET HEART (ORDER IN 25S) (MISCELLANEOUS) ×1 IMPLANT
BENZOIN TINCTURE PRP APPL 2/3 (GAUZE/BANDAGES/DRESSINGS) ×1 IMPLANT
BLADE SAW STERNAL (BLADE) ×2 IMPLANT
BLADE SURG 11 STRL SS (BLADE) ×1 IMPLANT
BLADE SURG ROTATE 9660 (MISCELLANEOUS) IMPLANT
CANISTER SUCTION 2500CC (MISCELLANEOUS) ×2 IMPLANT
CANNULA GUNDRY RCSP 15FR (MISCELLANEOUS) IMPLANT
CATH ROBINSON RED A/P 18FR (CATHETERS) ×4 IMPLANT
CATH THORACIC 28FR (CATHETERS) ×2 IMPLANT
CATH THORACIC 28FR RT ANG (CATHETERS) IMPLANT
CATH THORACIC 36FR (CATHETERS) ×2 IMPLANT
CATH THORACIC 36FR RT ANG (CATHETERS) ×2 IMPLANT
CLIP FOGARTY SPRING 6M (CLIP) IMPLANT
CLIP TI MEDIUM 24 (CLIP) IMPLANT
CLIP TI WIDE RED SMALL 24 (CLIP) ×2 IMPLANT
CLOTH BEACON ORANGE TIMEOUT ST (SAFETY) ×2 IMPLANT
COVER SURGICAL LIGHT HANDLE (MISCELLANEOUS) ×4 IMPLANT
CRADLE DONUT ADULT HEAD (MISCELLANEOUS) ×2 IMPLANT
DRAPE CARDIOVASCULAR INCISE (DRAPES) ×2
DRAPE SLUSH MACHINE 52X66 (DRAPES) IMPLANT
DRAPE SLUSH/WARMER DISC (DRAPES) IMPLANT
DRAPE SRG 135X102X78XABS (DRAPES) ×1 IMPLANT
DRSG COVADERM 4X14 (GAUZE/BANDAGES/DRESSINGS) ×2 IMPLANT
ELECT CAUTERY BLADE 6.4 (BLADE) ×2 IMPLANT
ELECT REM PT RETURN 9FT ADLT (ELECTROSURGICAL) ×4
ELECTRODE REM PT RTRN 9FT ADLT (ELECTROSURGICAL) ×2 IMPLANT
GLOVE BIO SURGEON STRL SZ 6 (GLOVE) ×2 IMPLANT
GLOVE BIO SURGEON STRL SZ 6.5 (GLOVE) ×5 IMPLANT
GLOVE BIO SURGEON STRL SZ7 (GLOVE) IMPLANT
GLOVE BIO SURGEON STRL SZ7.5 (GLOVE) IMPLANT
GLOVE BIOGEL PI IND STRL 6 (GLOVE) IMPLANT
GLOVE BIOGEL PI IND STRL 6.5 (GLOVE) IMPLANT
GLOVE BIOGEL PI IND STRL 7.0 (GLOVE) IMPLANT
GLOVE BIOGEL PI INDICATOR 6 (GLOVE)
GLOVE BIOGEL PI INDICATOR 6.5 (GLOVE)
GLOVE BIOGEL PI INDICATOR 7.0 (GLOVE)
GLOVE EUDERMIC 7 POWDERFREE (GLOVE) ×4 IMPLANT
GLOVE ORTHO TXT STRL SZ7.5 (GLOVE) IMPLANT
GOWN PREVENTION PLUS XLARGE (GOWN DISPOSABLE) ×2 IMPLANT
GOWN STRL NON-REIN LRG LVL3 (GOWN DISPOSABLE) ×9 IMPLANT
HEMOSTAT POWDER SURGIFOAM 1G (HEMOSTASIS) ×4 IMPLANT
HEMOSTAT SURGICEL 2X14 (HEMOSTASIS) ×2 IMPLANT
INSERT FOGARTY 61MM (MISCELLANEOUS) IMPLANT
INSERT FOGARTY XLG (MISCELLANEOUS) IMPLANT
KIT BASIN OR (CUSTOM PROCEDURE TRAY) ×2 IMPLANT
KIT CATH CPB BARTLE (MISCELLANEOUS) ×2 IMPLANT
KIT ROOM TURNOVER OR (KITS) ×2 IMPLANT
KIT SUCTION CATH 14FR (SUCTIONS) ×2 IMPLANT
KIT VASOVIEW W/TROCAR VH 2000 (KITS) ×2 IMPLANT
NS IRRIG 1000ML POUR BTL (IV SOLUTION) ×10 IMPLANT
PACK OPEN HEART (CUSTOM PROCEDURE TRAY) ×2 IMPLANT
PAD ARMBOARD 7.5X6 YLW CONV (MISCELLANEOUS) ×4 IMPLANT
PENCIL BUTTON HOLSTER BLD 10FT (ELECTRODE) ×2 IMPLANT
PUNCH AORTIC ROTATE 4.0MM (MISCELLANEOUS) IMPLANT
PUNCH AORTIC ROTATE 4.5MM 8IN (MISCELLANEOUS) ×2 IMPLANT
PUNCH AORTIC ROTATE 5MM 8IN (MISCELLANEOUS) IMPLANT
SET CARDIOPLEGIA MPS 5001102 (MISCELLANEOUS) ×1 IMPLANT
SOLUTION ANTI FOG 6CC (MISCELLANEOUS) IMPLANT
SPONGE GAUZE 4X4 12PLY (GAUZE/BANDAGES/DRESSINGS) ×4 IMPLANT
SPONGE INTESTINAL PEANUT (DISPOSABLE) IMPLANT
SPONGE LAP 18X18 X RAY DECT (DISPOSABLE) ×1 IMPLANT
SPONGE LAP 4X18 X RAY DECT (DISPOSABLE) ×2 IMPLANT
STRIP CLOSURE SKIN 1/2X4 (GAUZE/BANDAGES/DRESSINGS) ×1 IMPLANT
SUT BONE WAX W31G (SUTURE) ×2 IMPLANT
SUT MNCRL AB 4-0 PS2 18 (SUTURE) ×1 IMPLANT
SUT PROLENE 3 0 SH DA (SUTURE) IMPLANT
SUT PROLENE 3 0 SH1 36 (SUTURE) IMPLANT
SUT PROLENE 4 0 RB 1 (SUTURE)
SUT PROLENE 4 0 SH DA (SUTURE) IMPLANT
SUT PROLENE 4-0 RB1 .5 CRCL 36 (SUTURE) IMPLANT
SUT PROLENE 5 0 C 1 36 (SUTURE) IMPLANT
SUT PROLENE 6 0 C 1 30 (SUTURE) ×3 IMPLANT
SUT PROLENE 6 0 CC (SUTURE) IMPLANT
SUT PROLENE 7 0 BV 1 (SUTURE) IMPLANT
SUT PROLENE 7 0 BV1 MDA (SUTURE) ×2 IMPLANT
SUT PROLENE 7.0 RB 3 (SUTURE) ×2 IMPLANT
SUT PROLENE 8 0 BV175 6 (SUTURE) IMPLANT
SUT SILK  1 MH (SUTURE)
SUT SILK 1 MH (SUTURE) IMPLANT
SUT SILK 2 0 SH CR/8 (SUTURE) ×1 IMPLANT
SUT SILK 3 0 SH CR/8 (SUTURE) IMPLANT
SUT STEEL STERNAL CCS#1 18IN (SUTURE) IMPLANT
SUT STEEL SZ 6 DBL 3X14 BALL (SUTURE) IMPLANT
SUT VIC AB 1 CTX 36 (SUTURE) ×6
SUT VIC AB 1 CTX36XBRD ANBCTR (SUTURE) ×2 IMPLANT
SUT VIC AB 2-0 CT1 27 (SUTURE) ×2
SUT VIC AB 2-0 CT1 TAPERPNT 27 (SUTURE) ×1 IMPLANT
SUT VIC AB 2-0 CTX 27 (SUTURE) IMPLANT
SUT VIC AB 3-0 SH 27 (SUTURE)
SUT VIC AB 3-0 SH 27X BRD (SUTURE) IMPLANT
SUT VIC AB 3-0 X1 27 (SUTURE) IMPLANT
SUT VICRYL 4-0 PS2 18IN ABS (SUTURE) IMPLANT
SUTURE E-PAK OPEN HEART (SUTURE) ×2 IMPLANT
SYSTEM SAHARA CHEST DRAIN ATS (WOUND CARE) ×2 IMPLANT
TAPE CLOTH SURG 4X10 WHT LF (GAUZE/BANDAGES/DRESSINGS) ×2 IMPLANT
TOWEL OR 17X24 6PK STRL BLUE (TOWEL DISPOSABLE) ×2 IMPLANT
TOWEL OR 17X26 10 PK STRL BLUE (TOWEL DISPOSABLE) ×2 IMPLANT
TRAY FOLEY IC TEMP SENS 14FR (CATHETERS) ×2 IMPLANT
TUBE SUCT INTRACARD DLP 20F (MISCELLANEOUS) ×2 IMPLANT
TUBING INSUFFLATION 10FT LAP (TUBING) ×2 IMPLANT
UNDERPAD 30X30 INCONTINENT (UNDERPADS AND DIAPERS) ×2 IMPLANT
WATER STERILE IRR 1000ML POUR (IV SOLUTION) ×4 IMPLANT

## 2011-01-19 NOTE — Transfer of Care (Signed)
Immediate Anesthesia Transfer of Care Note  Patient: Jonathon [name]  Procedure(s) Performed:  CORONARY ARTERY BYPASS GRAFTING (CABG)  Patient Location: SICU  Anesthesia Type: General  Level of Consciousness: sedated  Airway & Oxygen Therapy: Patient remains intubated per anesthesia plan  Post-op Assessment: Post -op Vital signs reviewed and stable  Post vital signs: Reviewed  Complications: No apparent anesthesia complications

## 2011-01-19 NOTE — Preoperative (Signed)
Beta Blockers   Reason not to administer Beta Blockers:Not Applicable 

## 2011-01-19 NOTE — OR Nursing (Signed)
Second Time Out performed at [phone] with Dr. Laneta Simmers present, prior to chest incision. Chest incision at 0945.

## 2011-01-19 NOTE — Anesthesia Preprocedure Evaluation (Signed)
Anesthesia Evaluation  Patient identified by MRN, date of birth, ID band Patient awake    Reviewed: Allergy & Precautions, H&P , NPO status , Patient's Chart, lab work & pertinent test results  Airway Mallampati: II  Neck ROM: full    Dental   Pulmonary          Cardiovascular hypertension, + CAD and + Past MI     Neuro/Psych    GI/Hepatic   Endo/Other    Renal/GU      Musculoskeletal   Abdominal   Peds  Hematology   Anesthesia Other Findings   Reproductive/Obstetrics                           Anesthesia Physical Anesthesia Plan  ASA: III  Anesthesia Plan: General   Post-op Pain Management:    Induction: Intravenous  Airway Management Planned: Oral ETT  Additional Equipment: Arterial line, CVP, PA Cath and Ultrasound Guidance Line Placement  Intra-op Plan:   Post-operative Plan: Post-operative intubation/ventilation  Informed Consent: I have reviewed the patients History and Physical, chart, labs and discussed the procedure including the risks, benefits and alternatives for the proposed anesthesia with the patient or authorized representative who has indicated his/her understanding and acceptance.     Plan Discussed with: CRNA and Surgeon  Anesthesia Plan Comments:         Anesthesia Quick Evaluation

## 2011-01-19 NOTE — Anesthesia Procedure Notes (Signed)
Procedures

## 2011-01-19 NOTE — Progress Notes (Signed)
Patient ID: Jonathon [name], male   DOB: 1945/06/20, 65 y.o.   MRN: [id number]   Filed Vitals:   01/19/11 1600 01/19/11 1610 01/19/11 1700 01/19/11 1800  BP: 103/61 117/62 103/62 92/55  Pulse: 68 67 72 77  Temp: 97.3 F (36.3 C) 97.7 F (36.5 C) 98.2 F (36.8 C) 98.6 F (37 C)  TempSrc: Core (Comment)  Core (Comment)   Resp: 19 16 15 15   Height:      Weight:      SpO2: 100% 100% 100% 100%    Extubated and neuro intact  CT output ok  UO ok  BMET    Component Value Date/Time   NA 138 01/19/2011 1315   K 4.2 01/19/2011 1315   CL 101 01/19/2011 0505   CO2 26 01/19/2011 0505   GLUCOSE 110* 01/19/2011 1315   BUN 16 01/19/2011 0505   CREATININE 1.01 01/19/2011 0505   CALCIUM 9.0 01/19/2011 0505   GFRNONAA 76* 01/19/2011 0505   GFRAA 88* 01/19/2011 0505    CBC    Component Value Date/Time   WBC 12.4* 01/19/2011 1833   RBC 3.36* 01/19/2011 1833   HGB 10.4* 01/19/2011 1833   HCT 29.1* 01/19/2011 1833   PLT 135* 01/19/2011 1833   MCV 86.6 01/19/2011 1833   MCH 31.0 01/19/2011 1833   MCHC 35.7 01/19/2011 1833   RDW 12.5 01/19/2011 1833   LYMPHSABS 1.1 01/13/2011 1039   MONOABS 0.4 01/13/2011 1039   EOSABS 0.1 01/13/2011 1039   BASOSABS 0.0 01/13/2011 1039    Stable postop course.

## 2011-01-19 NOTE — Brief Op Note (Signed)
01/13/2011 - 01/19/2011  11:15 AM  PATIENT:  Jonathon [name]  65 y.o. male  PRE-OPERATIVE DIAGNOSIS:  Coronary Artery Disease  POST-OPERATIVE DIAGNOSIS:  Coronary Artery Disease  PROCEDURE:  Procedure(s): CORONARY ARTERY BYPASS GRAFTING (CABG) X 3 (LIMA-LAD, SVG- Int, SVG-OM), EVH right thigh  SURGEON:  Surgeon(s): Alleen Borne, MD  PHYSICIAN ASSISTANT: Coral Ceo, PA-C  ANESTHESIA:   general  PATIENT CONDITION:  ICU - intubated and hemodynamically stable.  PRE-OPERATIVE WEIGHT: 86.1 kg

## 2011-01-19 NOTE — Progress Notes (Signed)
Utilization review completed. Anette Guarneri, RN, BSN. 01/19/11

## 2011-01-19 NOTE — Op Note (Signed)
NAMERHIAN, FUNARI              ACCOUNT NO.:  [id number]  MEDICAL RECORD NO.:  [id number]  LOCATION:  2302                         FACILITY:  [facility]  PHYSICIAN:  Evelene Croon, M.D.     DATE OF BIRTH:  July 24, 1945  DATE OF PROCEDURE:  01/19/2011 DATE OF DISCHARGE:                              OPERATIVE REPORT   PREOPERATIVE DIAGNOSIS:  Severe three-vessel cord with coronary artery disease, status post non ST-segment elevation myocardial infarction.  POSTOPERATIVE DIAGNOSIS:  Severe three-vessel cord with coronary artery disease, status post non-ST-segment elevation myocardial infarction.  OPERATIVE PROCEDURE:  Median sternotomy, extracorporeal circulation, coronary artery bypass graft surgery x3 using a left internal mammary artery graft to left anterior descending coronary artery, with a saphenous vein graft to the intermediate coronary artery, and a saphenous vein graft to the obtuse marginal branch of the left circumflex coronary artery.  Endoscopic vein harvesting from the right leg.  ATTENDING SURGEON:  Evelene Croon, MD  ASSISTANT:  Coral Ceo, PA-C  ANESTHESIA:  General endotracheal.  CLINICAL HISTORY:  This patient is a 65 year old gentleman with no significant prior medical history, who developed new onset of heaviness in his chest on January 04, 2011, while baby-sitting his grandchild. This was associated with some diaphoresis.  It resolved after a few minutes and recurred the following day when he was driving and lasted longer but still was resolved  in about 10 minutes.  His son was a paramedic and told him to come to the hospital for evaluation.  He had positive cardiac enzymes at the time of presentation and ruled in for a non-ST-segment elevation MI.  Since admission, he has had no further symptoms.  His initial cardiac catheterization was done by Dr. [place] and showed the culprit to be a 95% ostial and proximal first marginal stenosis.  The patient also had  ostial stenosis of the LAD and intermediate which were not initially felt to be a high-grade.  Ejection fraction was 55-65% with no significant mitral regurgitation.  The patient was set up for percutaneous intervention the following day, due to some bleeding from his right groin during initial catheterization, and after Dr. Riley Kill reviewed the films, he felt that the ostial stenosis of the LAD and intermediate was significant and unfavorable for PCI, and therefore, the patient was referred for surgery.  After I reviewed the films, I agreed with Dr. Riley Kill that coronary artery bypass graft surgery is the best treatment to prevent further ischemia and infarction and improve his quality of life.  I discussed the operative procedure with the patient and his family including alternatives, benefits, and risks including, but not limited to bleeding, blood transfusion, infection, stroke, myocardial infarction, graft failure, and death.  He understood all this and agreed to proceed.  OPERATIVE PROCEDURE:  The patient was taken to the operating room and placed on the table in supine position.  After induction of general endotracheal anesthesia, a Foley catheter was placed in bladder using sterile technique.  Then, the chest, abdomen, and both lower extremities were prepped and draped in usual sterile manner.  Chest was entered through a median sternotomy incision and pericardium opened in midline. Examination of the heart showed good ventricular  contractility.  The ascending aorta had no palpable plaques in it.  Then, the left internal mammary artery was harvested from chest wall as a pedicle graft.  This is a medium caliber vessel with excellent blood flow through it.  At the same time, a segment of greater saphenous vein was harvested from the right leg using endoscopic vein harvest technique.  This vein was of medium size and good quality.  The patient was placed on cardiopulmonary bypass  and distal coronaries were identified.  The LAD was a large graftable vessel with no distal disease in it.  The intermediate was a moderate-size graftable vessel with no distal disease in it.  The obtuse marginal was a large graftable vessel with no distal disease in it.  Then, the aorta was crossclamped, and 800 mL of cold blood antegrade cardioplegia was administered in the aortic root with quick arrest of the heart, but slow myocardial cooling.  I suspect this was probably due to the significant LAD ostial stenosis.  Topical hypothermic iced saline was used.  Systemic hypothermia to 32 degrees centigrade was used.  A temperature probe was placed in the septum insulating the pad in the pericardium.  The first distal anastomosis was then performed to the obtuse marginal branch.  The internal diameter was about 2 mm.  The conduit used was a segment of greater saphenous vein, and the anastomosis performed in an end-to-side manner using continuous 7-0 Prolene suture.  Flow was noted through the graft and was excellent.  The second distal anastomosis was performed to the intermediate coronary artery.  The internal diameter was about 1.6 mm.  The conduit used was a second segment of greater saphenous vein, and the anastomosis was performed in an end-to-side manner using continuous 7-0 Prolene suture. Flow was noted through the graft and was excellent.  Then, the third distal anastomosis was performed to the mid LAD.  The internal diameter was about 2 mm.  The conduit used was a left internal mammary graft and was brought through an opening in the left pericardium anterior to the phrenic nerve.  This anastomosed the LAD in an end-to- side manner using continuous 8-0 Prolene suture.  The pedicle was sutured to the epicardium with 6-0 Prolene sutures.  The patient was then rewarmed to 37 degrees centigrade.  Another dose cardioplegia was given.  With the crossclamp in place, the 2 proximal  vein graft anastomoses were performed to the mid ascending aorta in an end-to-side manner using continuous 6-0 Prolene suture.  Then, the clamp was removed from the mammary pedicle.  There was rapid warming of the ventricular septum and return of spontaneous ventricular fibrillation.  The crossclamp was removed with a time of 52 minutes, and the patient was spontaneously converted to sinus rhythm.  The proximal and distal anastomoses appeared hemostatic and allowed the grafts satisfactory. Graft markers were placed around the proximal anastomosis.  Two temporary right ventricular and right atrial pacing wires were placed above the skin.  When the patient rewarmed to 37 degrees centigrade, he was weaned from cardiopulmonary bypass on no inotropic agents.  Total bypass time was 68 minutes.  Cardiac function appeared excellent with a cardiac output of 5 L/minute.  Protamine was given and venous and aortic cannulas were removed without difficulty.  Hemostasis was achieved.  Three chest tubes were placed with a tube in the posterior pericardium, and 1 in the left pleural space, and 1 in the anterior  mediastinum.  The sternum was then closed with  double #6 stainless steel wires.  Fascia was closed with continuous #1 Vicryl suture.  Subcutaneous tissue was closed with continuous 2-0 Vicryl, and the skin with a 3-0 Vicryl subcuticular closure.  The lower extremity vein harvest site was closed in layers in similar manner.  The sponge, needle, and instrument counts were correct according to scrub nurse.  Dry sterile dressing was applied over the incisions and around chest tubes, which were hooked to Pleur-Evac suction.  The patient remained hemodynamically stable and was transported to the SICU in guarded but stable condition.     Evelene Croon, M.D.     BB/MEDQ  D:  01/19/2011  T:  01/19/2011  Job:  [id number]

## 2011-01-19 NOTE — Anesthesia Postprocedure Evaluation (Signed)
  Anesthesia Post-op Note  Patient: Jonathon [name]  Procedure(s) Performed:  CORONARY ARTERY BYPASS GRAFTING (CABG)  Patient Location: ICU  Anesthesia Type: General  Level of Consciousness: sedated  Airway and Oxygen Therapy: Patient remains intubated per anesthesia plan  Post-op Pain: none  Post-op Assessment: Post-op Vital signs reviewed, Patient's Cardiovascular Status Stable and Respiratory Function Stable  Post-op Vital Signs: Reviewed and stable  Complications: No apparent anesthesia complications

## 2011-01-19 NOTE — Progress Notes (Signed)
Respiratory Therapy Extubation Procedure Note   Procedure Time out: Performed  Verified Patient ID:  yes Verified Procedure:  yes  Reason For Extubation: no further need for ventilatory support. SICU rapid wean protocol  Special Equipment Available:  no   Pre Extubation Assessment:   Placed in sitting position: yes Lungs inflated prior to tube removal:  yes Gag Reflex:strong NIF:  -22 cm H2O VC:  1010 ml Cuff Deflation Test - Able to breathe around deflated cuff: yes Pharynx Suctioned prior to cuff deflation: yes          Post Extubation Oxygen Delivery Device: 4 liters/min via Patient connected to nasal cannula oxygen  clear to auscultation, no wheezes, rales, or rhonchi   Additional Assessment:  Pt extubated to 4 L [place], pt tol well, strong cough, RN instructed on IS, VS stable throughout. Will cont to monitor  Emelda Fear, Lucious [place]

## 2011-01-19 NOTE — Progress Notes (Signed)
No changes over the past 2 days.  He remains stable without chest pain or sob.  Plan CABG this am.

## 2011-01-20 ENCOUNTER — Inpatient Hospital Stay (HOSPITAL_COMMUNITY): Payer: BC Managed Care – PPO

## 2011-01-20 LAB — GLUCOSE, CAPILLARY

## 2011-01-20 LAB — CBC
HCT: 30 % — ABNORMAL LOW (ref 39.0–52.0)
HCT: 31 % — ABNORMAL LOW (ref 39.0–52.0)
Hemoglobin: 10.3 g/dL — ABNORMAL LOW (ref 13.0–17.0)
MCHC: 34.3 g/dL (ref 30.0–36.0)
MCHC: 34.8 g/dL (ref 30.0–36.0)
MCV: 88.1 fL (ref 78.0–100.0)
RDW: 12.6 % (ref 11.5–15.5)
RDW: 12.9 % (ref 11.5–15.5)
WBC: 11.1 10*3/uL — ABNORMAL HIGH (ref 4.0–10.5)

## 2011-01-20 LAB — BASIC METABOLIC PANEL
BUN: 16 mg/dL (ref 6–23)
Chloride: 107 mEq/L (ref 96–112)
GFR calc Af Amer: >90 mL/min (ref >90)
GFR calc non Af Amer: >90 mL/min (ref >90)
Glucose, Bld: 142 mg/dL — ABNORMAL HIGH (ref 70–99)
Potassium: 4.3 mEq/L (ref 3.5–5.1)
Sodium: 138 mEq/L (ref 135–145)

## 2011-01-20 LAB — POCT I-STAT, CHEM 8
BUN: 20 mg/dL (ref 6–23)
Calcium, Ion: 1.18 mmol/L (ref 1.12–1.32)
Creatinine, Ser: 0.9 mg/dL (ref 0.50–1.35)
TCO2: 27 mmol/L (ref 0–100)

## 2011-01-20 LAB — MAGNESIUM: Magnesium: 2.3 mg/dL (ref 1.5–2.5)

## 2011-01-20 MED ORDER — POTASSIUM CHLORIDE CRYS ER 20 MEQ PO TBCR
40.0000 meq | EXTENDED_RELEASE_TABLET | Freq: Once | ORAL | Status: AC
  Administered 2011-01-20: 40 meq via ORAL
  Filled 2011-01-20: qty 2

## 2011-01-20 MED ORDER — FUROSEMIDE 10 MG/ML IJ SOLN
40.0000 mg | Freq: Once | INTRAMUSCULAR | Status: AC
  Administered 2011-01-20: 40 mg via INTRAVENOUS

## 2011-01-20 MED ORDER — METOPROLOL TARTRATE 25 MG PO TABS
25.0000 mg | ORAL_TABLET | Freq: Two times a day (BID) | ORAL | Status: DC
  Administered 2011-01-20 – 2011-01-24 (×8): 25 mg via ORAL
  Filled 2011-01-20 (×9): qty 1

## 2011-01-20 MED ORDER — SODIUM CHLORIDE 0.9 % IJ SOLN
3.0000 mL | Freq: Two times a day (BID) | INTRAMUSCULAR | Status: DC
  Administered 2011-01-20 – 2011-01-23 (×6): 3 mL via INTRAVENOUS

## 2011-01-20 MED ORDER — ONDANSETRON HCL 4 MG PO TABS: 4.0000 mg | ORAL_TABLET | Freq: Four times a day (QID) | ORAL | Status: DC | PRN

## 2011-01-20 MED ORDER — TRAMADOL HCL 50 MG PO TABS: 50.0000 mg | ORAL_TABLET | ORAL | Status: DC | PRN

## 2011-01-20 MED ORDER — SODIUM CHLORIDE 0.9 % IJ SOLN: 3.0000 mL | INTRAMUSCULAR | Status: DC | PRN

## 2011-01-20 MED ORDER — BISACODYL 10 MG RE SUPP: 10.0000 mg | Freq: Every day | RECTAL | Status: DC | PRN

## 2011-01-20 MED ORDER — ACETAMINOPHEN 325 MG PO TABS
650.0000 mg | ORAL_TABLET | Freq: Four times a day (QID) | ORAL | Status: DC | PRN
  Administered 2011-01-21 (×2): 650 mg via ORAL
  Filled 2011-01-20 (×2): qty 2

## 2011-01-20 MED ORDER — DOCUSATE SODIUM 100 MG PO CAPS
200.0000 mg | ORAL_CAPSULE | Freq: Every day | ORAL | Status: DC
  Administered 2011-01-21 – 2011-01-24 (×4): 200 mg via ORAL
  Filled 2011-01-20 (×4): qty 2

## 2011-01-20 MED ORDER — METOPROLOL TARTRATE 25 MG/10 ML ORAL SUSPENSION
25.0000 mg | Freq: Two times a day (BID) | ORAL | Status: DC
  Filled 2011-01-20 (×2): qty 10

## 2011-01-20 MED ORDER — ONDANSETRON HCL 4 MG/2ML IJ SOLN: 4.0000 mg | Freq: Four times a day (QID) | INTRAMUSCULAR | Status: DC | PRN

## 2011-01-20 MED ORDER — PANTOPRAZOLE SODIUM 40 MG PO TBEC
40.0000 mg | DELAYED_RELEASE_TABLET | Freq: Every day | ORAL | Status: DC
  Administered 2011-01-21 – 2011-01-24 (×4): 40 mg via ORAL
  Filled 2011-01-20 (×4): qty 1

## 2011-01-20 MED ORDER — BISACODYL 5 MG PO TBEC: 10.0000 mg | DELAYED_RELEASE_TABLET | Freq: Every day | ORAL | Status: DC | PRN

## 2011-01-20 MED ORDER — POVIDONE-IODINE 10 % EX SOLN
1.0000 "application " | Freq: Two times a day (BID) | CUTANEOUS | Status: DC
  Administered 2011-01-20 – 2011-01-23 (×7): 1 via TOPICAL
  Filled 2011-01-20: qty 15

## 2011-01-20 MED ORDER — OXYCODONE HCL 5 MG PO TABS
5.0000 mg | ORAL_TABLET | ORAL | Status: DC | PRN
  Administered 2011-01-20: 5 mg via ORAL
  Filled 2011-01-20: qty 1

## 2011-01-20 MED ORDER — METOPROLOL TARTRATE 25 MG PO TABS
25.0000 mg | ORAL_TABLET | Freq: Two times a day (BID) | ORAL | Status: DC
  Administered 2011-01-20: 25 mg via ORAL
  Filled 2011-01-20 (×2): qty 1

## 2011-01-20 MED ORDER — MOVING RIGHT ALONG BOOK
Freq: Once | Status: AC
  Administered 2011-01-21: 14:00:00
  Filled 2011-01-20: qty 1

## 2011-01-20 MED ORDER — SODIUM CHLORIDE 0.9 % IV SOLN: 250.0000 mL | INTRAVENOUS | Status: DC | PRN

## 2011-01-20 NOTE — Progress Notes (Signed)
Patient ID: Jonathon [name], male   DOB: [birth year], 65 y.o.   MRN: [id number]  Neuro intact  Good pain control Stable day Delight Ovens MD 01/20/2011 6:16 PM

## 2011-01-20 NOTE — Progress Notes (Signed)
1 Day Post-Op Procedure(s) (LRB): CORONARY ARTERY BYPASS GRAFTING (CABG) (N/A) Subjective:  No complaints  Objective: Vital signs in last 24 hours: Temp:  [96.1 F (35.6 C)-99.1 F (37.3 C)] 98.8 F (37.1 C) (12/06 0700) Pulse Rate:  [67-94] 93  (12/06 0743) Cardiac Rhythm:  [-] Normal sinus rhythm (12/06 0400) Resp:  [7-21] 21  (12/06 0700) BP: (87-150)/(48-78) 136/73 mmHg (12/06 0743) SpO2:  [98 %-100 %] 100 % (12/06 0700) FiO2 (%):  [40 %-50 %] 40.5 % (12/05 1700) Weight:  [90.4 kg (199 lb 4.7 oz)] 199 lb 4.7 oz (90.4 kg) (12/06 0700)  Hemodynamic parameters for last 24 hours: PAP: (18-37)/(3-20) 32/17 mmHg CO:  [4.5 L/min-7.9 L/min] 7.9 L/min CI:  [2.2 L/min/m2-3.9 L/min/m2] 3.9 L/min/m2  Intake/Output from previous day: 12/05 0701 - 12/06 0700 In: 4360.9 [P.O.:90; I.V.:2844.9; Blood:646; NG/GT:30; IV Piggyback:750] Out: 3140 [Urine:2250; Blood:300; Chest Tube:590] Intake/Output this shift:    General appearance: alert and cooperative Neurologic: intact Heart: regular rate and rhythm, S1, S2 normal, no murmur, click, rub or gallop Lungs: clear to auscultation bilaterally Extremities: extremities normal, atraumatic, no cyanosis or edema Wound: dressing dry  Lab Results:  Basename 01/20/11 0340 01/19/11 1839 01/19/11 1833  WBC 11.1* -- 12.4*  HGB 10.3* 10.9* --  HCT 30.0* 32.0* --  PLT 135* -- 135*   BMET:  Basename 01/20/11 0340 01/19/11 1839 01/19/11 0505  NA 138 140 --  [name] 4.3 4.0 --  CL 107 106 --  CO2 24 -- 26  GLUCOSE 142* 134* --  BUN 16 17 --  CREATININE 0.81 0.90 --  CALCIUM 8.2* -- 9.0    PT/INR:  Basename 01/19/11 1312  LABPROT 16.5*  INR 1.31   ABG    Component Value Date/Time   PHART 7.332* 01/19/2011 1829   HCO3 25.1* 01/19/2011 1829   TCO2 23 01/19/2011 1839   ACIDBASEDEF 1.0 01/19/2011 1829   O2SAT 99.0 01/19/2011 1829   CBG (last 3)   Basename 01/20/11 0724 01/20/11 0342 01/19/11 2328  GLUCAP 119* 126* 111*     Assessment/Plan: S/P Procedure(s) (LRB): CORONARY ARTERY BYPASS GRAFTING (CABG) (N/A) Mobilize Diuresis d/c tubes/lines Plan for transfer to step-down: see transfer orders   LOS: 7 days    Jonathon [name] 01/20/2011

## 2011-01-20 NOTE — Plan of Care (Signed)
Problem: Phase II Progression Outcomes Goal: Patient extubated within - Outcome: Completed/Met Date Met:  01/20/11 6 hours

## 2011-01-21 ENCOUNTER — Other Ambulatory Visit: Payer: Self-pay

## 2011-01-21 ENCOUNTER — Encounter (HOSPITAL_COMMUNITY): Payer: Self-pay | Admitting: Surgery

## 2011-01-21 LAB — CBC
Hemoglobin: 11.3 g/dL — ABNORMAL LOW (ref 13.0–17.0)
MCH: 30.3 pg (ref 26.0–34.0)
MCV: 88.7 fL (ref 78.0–100.0)
Platelets: 176 10*3/uL (ref 150–400)
RBC: 3.73 MIL/uL — ABNORMAL LOW (ref 4.22–5.81)

## 2011-01-21 LAB — BASIC METABOLIC PANEL
BUN: 17 mg/dL (ref 6–23)
CO2: 29 mEq/L (ref 19–32)
Calcium: 8.4 mg/dL (ref 8.4–10.5)
Glucose, Bld: 112 mg/dL — ABNORMAL HIGH (ref 70–99)
Sodium: 137 mEq/L (ref 135–145)

## 2011-01-21 MED ORDER — DEXTROSE 5 % IV SOLN
30.0000 mg/h | INTRAVENOUS | Status: DC
  Administered 2011-01-22 (×2): 30 mg/h via INTRAVENOUS
  Filled 2011-01-21 (×3): qty 9

## 2011-01-21 MED ORDER — AMIODARONE LOAD VIA INFUSION
150.0000 mg | Freq: Once | INTRAVENOUS | Status: DC
  Administered 2011-01-21: 150 mg via INTRAVENOUS
  Filled 2011-01-21: qty 151.2

## 2011-01-21 MED ORDER — DEXTROSE 5 % IV SOLN
60.0000 mg/h | INTRAVENOUS | Status: AC
  Administered 2011-01-21 – 2011-01-22 (×2): 60 mg/h via INTRAVENOUS
  Filled 2011-01-21 (×2): qty 9

## 2011-01-21 MED FILL — Magnesium Sulfate Inj 50%: INTRAMUSCULAR | Qty: 10 | Status: AC

## 2011-01-21 MED FILL — Potassium Chloride Inj 2 mEq/ML: INTRAVENOUS | Qty: 40 | Status: AC

## 2011-01-21 NOTE — Progress Notes (Signed)
Pt HR got up in the 160's, going in and out of Atrial fibrillation, no complaints of chest pain or Shortness of breath. MD made aware, orders were given and carried out.

## 2011-01-21 NOTE — Plan of Care (Signed)
Problem: Phase III Progression Outcomes Goal: Time patient transferred to PCTU/Telemetry POD Outcome: Completed/Met Date Met:  01/21/11 09:15 01/21/11

## 2011-01-21 NOTE — Progress Notes (Signed)
2 Days Post-Op Procedure(s) (LRB): CORONARY ARTERY BYPASS GRAFTING (CABG) (N/A) Subjective: No complaints  Objective: Vital signs in last 24 hours: Temp:  [98 F (36.7 C)-99.8 F (37.7 C)] 99.5 F (37.5 C) (12/07 0800) Pulse Rate:  [74-103] 93  (12/07 0800) Cardiac Rhythm:  [-] Normal sinus rhythm (12/07 0800) Resp:  [0-20] 14  (12/07 0800) BP: (103-126)/(60-73) 126/73 mmHg (12/07 0800) SpO2:  [89 %-100 %] 94 % (12/07 0800) Weight:  [89.1 kg (196 lb 6.9 oz)] 196 lb 6.9 oz (89.1 kg) (12/07 0500)  Hemodynamic parameters for last 24 hours:    Intake/Output from previous day: 12/06 0701 - 12/07 0700 In: 1507.2 [P.O.:1160; I.V.:297.2; IV Piggyback:50] Out: 1950 [Urine:1900; Chest Tube:50] Intake/Output this shift: Total I/O In: -  Out: 750 [Urine:750]  General appearance: alert and cooperative Heart: regular rate and rhythm, S1, S2 normal, no murmur, click, rub or gallop Lungs: clear to auscultation bilaterally Extremities: extremities normal, atraumatic, no cyanosis or edema Wound: dressing dry  Lab Results:  Basename 01/21/11 0500 01/20/11 1710 01/20/11 1700  WBC 13.3* -- 13.4*  HGB 11.3* 10.5* --  HCT 33.1* 31.0* --  PLT 176 -- 156   BMET:  Basename 01/21/11 0500 01/20/11 1710 01/20/11 0340  NA 137 139 --  K 4.1 4.4 --  CL 102 100 --  CO2 29 -- 24  GLUCOSE 112* 120* --  BUN 17 20 --  CREATININE 0.83 0.90 --  CALCIUM 8.4 -- 8.2*    PT/INR:  Basename 01/19/11 1312  LABPROT 16.5*  INR 1.31   ABG    Component Value Date/Time   PHART 7.332* 01/19/2011 1829   HCO3 25.1* 01/19/2011 1829   TCO2 27 01/20/2011 1710   ACIDBASEDEF 1.0 01/19/2011 1829   O2SAT 99.0 01/19/2011 1829   CBG (last 3)   Basename 01/20/11 1526 01/20/11 1136 01/20/11 0724  GLUCAP 125* 138* 119*    Assessment/Plan: S/P Procedure(s) (LRB): CORONARY ARTERY BYPASS GRAFTING (CABG) (N/A) Mobilize Kept in SICU overnight since no bed on 2000 but has a bed for transfer this am.   LOS: 8  days    Camesha Farooq K 01/21/2011

## 2011-01-22 NOTE — Progress Notes (Addendum)
3 Days Post-Op Procedure(s) (LRB): CORONARY ARTERY BYPASS GRAFTING (CABG) (N/A)  Subjective: Went into AF last night, symptomatic.  Started on Amio gtt, converted to SR.  Feeling much better today. Still having brief runs of AF.  Objective: Vital signs in last 24 hours: Patient Vitals for the past 24 hrs:  BP Temp Temp src Pulse Resp SpO2 Weight  01/22/11 0700 - - - - - - 87.091 kg (192 lb)  01/22/11 0555 121/76 mmHg 98.2 F (36.8 C) Oral 95  18  98 % -  01/22/11 0232 108/73 mmHg - - - - 98 % -  01/21/11 2209 124/79 mmHg 100.4 F (38 C) Oral 109  18  93 % -  01/21/11 1947 - 99.4 F (37.4 C) Oral 106  18  - -  01/21/11 1300 123/76 mmHg 100.5 F (38.1 C) - 112  18  96 % -   Current Weight  01/22/11 87.091 kg (192 lb)  pre-op wt= 86.1   Intake/Output from previous day: 12/07 0701 - 12/08 0700 In: -  Out: 1750 [Urine:1750]    PHYSICAL EXAM:  Heart: RRR Lungs: clear Wound: clean and dry Extremities: no edema  Lab Results: CBC: Basename 01/21/11 0500 01/20/11 1710 01/20/11 1700  WBC 13.3* -- 13.4*  HGB 11.3* 10.5* --  HCT 33.1* 31.0* --  PLT 176 -- 156   BMET:  Basename 01/21/11 0500 01/20/11 1710 01/20/11 0340  NA 137 139 --  K 4.1 4.4 --  CL 102 100 --  CO2 29 -- 24  GLUCOSE 112* 120* --  BUN 17 20 --  CREATININE 0.83 0.90 --  CALCIUM 8.4 -- 8.2*    PT/INR:  Basename 01/19/11 1312  LABPROT 16.5*  INR 1.31     Assessment/Plan: S/P Procedure(s) (LRB): CORONARY ARTERY BYPASS GRAFTING (CABG) (N/A) AF- SR. Still having occ runs of AF.  Continue Amiodarone. Switch to po once rhythm stable. Mobilize as tolerated.   Had LGF last night, none since.  Continue to monitor.    LOS: 9 days    COLLINS,GINA H 01/22/2011   Pt seen and examined, agree with above.

## 2011-01-22 NOTE — Progress Notes (Signed)
CARDIAC REHAB PHASE I   PRE:  Rate/Rhythm: 85 SR    BP: sitting 114/70    SaO2: 97 2L  MODE:  Ambulation: 510 ft   POST:  Rate/Rhythm: 104 ST    BP: sitting 116/72     SaO2: 96 RA  Tolerated very well with quick pace. Started using RW but did not need so discarded. SaO2 good. No Afib. Ed completed. Encouraged more walking/IS/chair. Requests his name be sent to [facility]. [id number]  Harriet Masson CES, ACSM

## 2011-01-23 MED ORDER — AMIODARONE HCL 400 MG PO TABS: 400.0000 mg | ORAL_TABLET | Freq: Every day | ORAL | Status: DC

## 2011-01-23 MED ORDER — ROSUVASTATIN CALCIUM 40 MG PO TABS: 40.0000 mg | ORAL_TABLET | Freq: Every day | ORAL | Status: DC

## 2011-01-23 MED ORDER — AMIODARONE HCL 200 MG PO TABS
400.0000 mg | ORAL_TABLET | Freq: Every day | ORAL | Status: DC
  Administered 2011-01-23 – 2011-01-24 (×2): 400 mg via ORAL
  Filled 2011-01-23 (×3): qty 2

## 2011-01-23 MED ORDER — ASPIRIN 325 MG PO TABS
325.0000 mg | ORAL_TABLET | Freq: Every day | ORAL | Status: DC
  Administered 2011-01-23 – 2011-01-24 (×2): 325 mg via ORAL
  Filled 2011-01-23 (×2): qty 1

## 2011-01-23 MED ORDER — OXYCODONE HCL 5 MG PO TABS: 5.0000 mg | ORAL_TABLET | ORAL | Status: AC | PRN

## 2011-01-23 MED ORDER — METOPROLOL TARTRATE 25 MG PO TABS: 25.0000 mg | ORAL_TABLET | Freq: Two times a day (BID) | ORAL | Status: DC

## 2011-01-23 MED ORDER — ASPIRIN 325 MG PO TABS: 325.0000 mg | ORAL_TABLET | Freq: Every day | ORAL | Status: AC

## 2011-01-23 NOTE — Progress Notes (Signed)
4 Days Post-Op Procedure(s) (LRB): CORONARY ARTERY BYPASS GRAFTING (CABG) (N/A)  Subjective: Feels well.  Maintaining SR.  Objective: Vital signs in last 24 hours: Patient Vitals for the past 24 hrs:  BP Temp Temp src Pulse Resp SpO2 Weight  01/23/11 0422 118/69 mmHg 98 F (36.7 C) Oral 86  20  94 % 87.862 kg (193 lb 11.2 oz)  01/22/11 2032 131/77 mmHg 98.3 F (36.8 C) Oral 88  19  95 % -  01/22/11 1700 118/72 mmHg 98.4 F (36.9 C) - 90  18  98 % -   Current Weight  01/23/11 87.862 kg (193 lb 11.2 oz)   Pre-op wt= 86.1 kg  Intake/Output from previous day: 12/08 0701 - 12/09 0700 In: 720 [P.O.:720] Out: 1400 [Urine:1400]    PHYSICAL EXAM:  Heart: RRR Lungs: clear Wound: clean and dry  Extremities: trace R LE edema  Lab Results: CBC: Basename 01/21/11 0500 01/20/11 1710 01/20/11 1700  WBC 13.3* -- 13.4*  HGB 11.3* 10.5* --  HCT 33.1* 31.0* --  PLT 176 -- 156   BMET:  Basename 01/21/11 0500 01/20/11 1710  NA 137 139  K 4.1 4.4  CL 102 100  CO2 29 --  GLUCOSE 112* 120*  BUN 17 20  CREATININE 0.83 0.90  CALCIUM 8.4 --    PT/INR: No results found for this basename: LABPROT,INR in the last 72 hours   Assessment/Plan: S/P Procedure(s) (LRB): CORONARY ARTERY BYPASS GRAFTING (CABG) (N/A) AF- now SR.  Will change Amio to po.  Continue beta blocker. CRPI Home ?later today vs in am   LOS: 10 days    COLLINS,GINA H 01/23/2011

## 2011-01-23 NOTE — Discharge Summary (Signed)
Discharge Summary  Name: Christino Mcglinchey DOB: Feb 17, 1945 65 y.o. MRN: [id number]  Admission Date: 01/13/2011 Discharge Date:    Admitting Diagnosis: Principal Problem:  *NSTEMI (non-ST elevated myocardial infarction) Active Problems:  Hypertension  Hyperlipidemia  Coronary artery disease   Discharge Diagnosis:  Principal Problem:  *NSTEMI (non-ST elevated myocardial infarction) Active Problems:  Hypertension  Hyperlipidemia  Coronary artery disease postop atrial fibrillation History of tobacco abuse   Procedures: Procedure(s): CORONARY ARTERY BYPASS GRAFTING (CABG) x3 (left internal mammary artery to the LAD, saphenous vein graft to the intermediate coronary, saphenous vein graft to the obtuse marginal) endoscopic vein harvest right leg on 01/19/2011   HPI:  The patient is a 65 y.o. male with no prior significant medical history who reports new onset of heaviness in his chest on Wednesday, 01/04/2011 while he was babysitting his grandchild. He described this as a "hunger pain" which was associated with diaphoresis. This resolved after a few minutes. The pain recurred the following day while he was driving and lasted a little longer but resolved in about 10 minutes. His son is a paramedic and told him to come to the hospital for evaluation. He had positive cardiac enzymes at the time of presentation and ruled in for a non-ST segment elevation MI. He was admitted for further cardiac workup.  Hospital Course:  The patient was admitted to [facility] on 01/13/2011 . He underwent cardiac catheterization by Dr. Riley Kill which showed a 50% ostial narrowing of the LAD, a 95% ostial obtuse marginal stenosis indices in a ramus branch. Left ventricular ejection fraction was 55-65% with no significant MR. A cardiac surgery consult was requested and the patient was seen by Dr. Laneta Simmers. He recommended surgical revascularization.All risks, benefits and alternatives of surgery were explained in detail,  and the patient agreed to proceed.  The patient was taken to the operating room and underwent the above procedure.  The postoperative course was notable for atrial fibrillation which occurred on postop day 3. He was started on amiodarone and quickly converted to normal sinus rhythm. His postoperative course has otherwise remained stable. He is ambulating in the halls without problem. He is tolerating a regular diet. He has been diuresed back to his preop weight. We anticipate discharge within the next 24 hours if no acute changes occur.   Recent vital signs:  Filed Vitals:   01/23/11 0422  BP: 118/69  Pulse: 86  Temp: 98 F (36.7 C)  Resp: 20    Recent laboratory studies:  CBC: Basename 01/21/11 0500 01/20/11 1710 01/20/11 1700  WBC 13.3* -- 13.4*  HGB 11.3* 10.5* --  HCT 33.1* 31.0* --  PLT 176 -- 156   BMET:  Basename 01/21/11 0500 01/20/11 1710  NA 137 139  K 4.1 4.4  CL 102 100  CO2 29 --  GLUCOSE 112* 120*  BUN 17 20  CREATININE 0.83 0.90  CALCIUM 8.4 --    PT/INR: No results found for this basename: LABPROT,INR in the last 72 hours  Discharge Medications:  Current Discharge Medication List    START taking these medications   Details  amiodarone (PACERONE) 400 MG tablet Take 1 tablet (400 mg total) by mouth daily. X 1 week, then 200 mg BID Qty: 75 tablet, Refills: 1    aspirin 325 MG tablet Take 1 tablet (325 mg total) by mouth daily.    metoprolol tartrate (LOPRESSOR) 25 MG tablet Take 1 tablet (25 mg total) by mouth 2 (two) times daily. Qty: 60 tablet, Refills: 1  oxyCODONE (OXY IR/ROXICODONE) 5 MG immediate release tablet Take 1-2 tablets (5-10 mg total) by mouth every 3 (three) hours as needed for pain. Qty: 30 tablet, Refills: 0    rosuvastatin (CRESTOR) 40 MG tablet Take 1 tablet (40 mg total) by mouth daily. Qty: 30 tablet, Refills: 1      STOP taking these medications     aspirin EC 81 MG tablet         Discharge Instructions:  The patient  is to refrain from driving, heavy lifting or strenuous activity.  May shower daily and clean incisions with soap and water.  May resume regular diet.    Follow-up Information    Follow up with [name], MD. Make an appointment in 2 weeks.   Contact information:   [address] [place] [address] [phone]       Follow up with Alleen Borne, MD in 3 weeks. (office will schedule)    Contact information:   [address] [place] [address] [phone]           Jagjit Riner H 01/23/2011, 11:29 AM

## 2011-01-23 NOTE — Progress Notes (Signed)
EPW discontinued per protocol. Tips intact. Patient tolerated well. Last INR INR/Prothrombin Time on   .  Patient advised Bedrest X 1 hour. Priest Lockridge Aba   

## 2011-01-24 NOTE — Progress Notes (Signed)
   CARE MANAGEMENT NOTE 01/24/2011  Patient:  Jonathon [name]   Account Number:  [id number]  Date Initiated:  01/20/2011  Documentation initiated by:  [facility]  Subjective/Objective Assessment:   post op CABG on 01-19-11.     Action/Plan:   PTA, PT INDEPENDENT, LIVES WITH SPOUSE.  HAS SUPPORT FROM ADDITIONAL FAMILY MEMBERS.   Anticipated DC Date:  01/26/2011   Anticipated DC Plan:  HOME W HOME HEALTH SERVICES      DC Planning Services  CM consult      Choice offered to / List presented to:             Status of service:  Completed, signed off Medicare Important Message given?   (If response is "NO", the following Medicare IM given date fields will be blank) Date Medicare IM given:   Date Additional Medicare IM given:    Discharge Disposition:  HOME/SELF CARE  Per UR Regulation:  Reviewed for med. necessity/level of care/duration of stay  Comments:  01/24/11 Jahmeer Porche,RN,BSN 1100 PT HAS PROGRESSED WELL AND IS DISCHARGED TODAY HOME WITH FAMILY.  HE HAS NO HOME HEALTH OR DME NEEDS. Phone #[phone]  01-20-11 3pm Avie Arenas, RNBSN - [phone] UR Completed.

## 2011-01-24 NOTE — Progress Notes (Signed)
5 Days Post-Op  Procedure(s) (LRB): CORONARY ARTERY BYPASS GRAFTING (CABG) (N/A) Subjective: Feels quite well overall. Has no new complaints.  Objective  Telemetry NSR/ STACH-110 this am while walking around in the room  Temp:  [98.2 F (36.8 C)-98.6 F (37 C)] 98.2 F (36.8 C) (12/10 0700) Pulse Rate:  [85-92] 92  (12/10 0700) Resp:  [18-20] 20  (12/10 0700) BP: (110-122)/(67-76) 110/74 mmHg (12/10 0700) SpO2:  [94 %-95 %] 94 % (12/10 0700) Weight:  [188 lb (85.276 kg)] 188 lb (85.276 kg) (12/10 0500)   Intake/Output Summary (Last 24 hours) at 01/24/11 0715 Last data filed at 01/24/11 0243  Gross per 24 hour  Intake    480 ml  Output    500 ml  Net    -20 ml       General appearance: alert and no distress Heart: regular rate and rhythm, S1, S2 normal, no murmur, click, rub or gallop Lungs: clear to auscultation bilaterally Abdomen: soft, non-tender; bowel sounds normal; no masses,  no organomegaly Extremities: trace edema Wound: incisions healing well without signs of infection  Lab Results: No results found for this basename: NA:2,K:2,CL:2,CO2:2,GLUCOSE:2,BUN:2,CREATININE:2,CALCIUM:2,MG:2,PHOS:2 in the last 72 hours No results found for this basename: AST:2,ALT:2,ALKPHOS:2,BILITOT:2,PROT:2,ALBUMIN:2 in the last 72 hours No results found for this basename: LIPASE:2,AMYLASE:2 in the last 72 hours No results found for this basename: WBC:2,NEUTROABS:2,HGB:2,HCT:2,MCV:2,PLT:2 in the last 72 hours No results found for this basename: CKTOTAL:4,CKMB:4,TROPONINI:4 in the last 72 hours No results found for this basename: POCBNP:3 in the last 72 hours No results found for this basename: DDIMER in the last 72 hours No results found for this basename: HGBA1C in the last 72 hours No results found for this basename: CHOL,HDL,LDLCALC,TRIG,CHOLHDL in the last 72 hours No results found for this basename: TSH,T4TOTAL,FREET3,T3FREE,THYROIDAB in the last 72 hours No results found for  this basename: VITAMINB12,FOLATE,FERRITIN,TIBC,IRON,RETICCTPCT in the last 72 hours  Medications: Scheduled    . amiodarone  400 mg Oral Daily  . aspirin  325 mg Oral Daily  . docusate sodium  200 mg Oral Daily  . metoprolol tartrate  25 mg Oral BID  . pantoprazole  40 mg Oral QAC breakfast  . povidone-iodine  1 application Topical BID  . rosuvastatin  40 mg Oral Daily  . sodium chloride  3 mL Intravenous Q12H     Radiology/Studies:  No results found.  INR: Will add last result for INR, ABG once components are confirmed Will add last 4 CBG results once components are confirmed  Assessment/Plan: S/P Procedure(s) (LRB): CORONARY ARTERY BYPASS GRAFTING (CABG) (N/A)  1. Continues to do very well. 2. Rhythm stable on current management 3. Stable for discharge to home  LOS: 11 days    Laymon Stockert E 12/10/20127:15 AM

## 2011-01-25 MED FILL — Sodium Chloride Irrigation Soln 0.9%: Qty: 3000 | Status: AC

## 2011-01-25 MED FILL — Sodium Chloride IV Soln 0.9%: INTRAVENOUS | Qty: 1000 | Status: AC

## 2011-01-25 MED FILL — Electrolyte-R (PH 7.4) Solution: INTRAVENOUS | Qty: 5000 | Status: AC

## 2011-01-25 MED FILL — Heparin Sodium (Porcine) Inj 1000 Unit/ML: INTRAMUSCULAR | Qty: 60 | Status: AC

## 2011-02-04 ENCOUNTER — Other Ambulatory Visit: Payer: Self-pay | Admitting: Surgery

## 2011-02-04 DIAGNOSIS — I2510 Atherosclerotic heart disease of native coronary artery without angina pectoris: Secondary | ICD-10-CM

## 2011-02-10 ENCOUNTER — Ambulatory Visit (INDEPENDENT_AMBULATORY_CARE_PROVIDER_SITE_OTHER): Payer: BC Managed Care – PPO | Admitting: Physician Assistant

## 2011-02-10 ENCOUNTER — Encounter: Payer: Self-pay | Admitting: Physician Assistant

## 2011-02-10 ENCOUNTER — Encounter: Payer: Self-pay | Admitting: *Deleted

## 2011-02-10 DIAGNOSIS — E785 Hyperlipidemia, unspecified: Secondary | ICD-10-CM

## 2011-02-10 DIAGNOSIS — I10 Essential (primary) hypertension: Secondary | ICD-10-CM

## 2011-02-10 DIAGNOSIS — I4891 Unspecified atrial fibrillation: Secondary | ICD-10-CM

## 2011-02-10 DIAGNOSIS — I2510 Atherosclerotic heart disease of native coronary artery without angina pectoris: Secondary | ICD-10-CM

## 2011-02-10 DIAGNOSIS — E039 Hypothyroidism, unspecified: Secondary | ICD-10-CM

## 2011-02-10 MED ORDER — AMIODARONE HCL 400 MG PO TABS: 200.0000 mg | ORAL_TABLET | Freq: Every day | ORAL | Status: DC

## 2011-02-10 NOTE — Progress Notes (Signed)
  [address] [place], [place]  [address] Phone: [phone] Fax:  [phone]  Date:  02/10/2011   Name:  Kaye Mitro       DOB:  Jun 27, 1945 MRN:  [id number]  PCP:  None  Primary Cardiologist:  Dr. Rollene Rotunda  Primary Electrophysiologist:  None    History of Present Illness: Benjamin Casanas is a 65 y.o. male presents for post hospital follow up.  He was admitted 11/29-12/9 with an NSTEMI.  LHC 01/13/11: oLAD 50% and prox 20%, oRI 40% and prox 40%, pCFX 40%, pOM1 95%, EF 55-65%.  Intervention of the obtuse marginal was felt to be too complex and surgery was recommended.  Echocardiogram 01/14/11: Mild LVH, EF 60%.  He was seen by Dr. Laneta Simmers.  CABG 01/19/11: LIMA-LAD, SVG-RI, SVG-OM.  Postoperative course was complicated by atrial fibrillation.  He converted to sinus rhythm with amiodarone.  Labs hemoglobin 11.3, potassium 4.1, creatinine 0.83, ALT 21, TC 191, TG 120, HDL 35, LDL 132, TSH 8.504, free T4 0.94.  Since d/c, he has done well.  Has some chest soreness.  Otherwise, denies dyspnea, orthopnea, PND, edema, syncope or palpitations.  Not really interested in cardiac rehab.    Past Medical History  Diagnosis Date  . Coronary artery disease     s/p NSTEMI 12/12;  LHC 01/13/11: oLAD 50% and prox 20%, oRI 40% and prox 40%, pCFX 40%, pOM1 95%, EF 55-65%.  Intervention of the obtuse marginal was felt to be too complex   . S/P CABG (coronary artery bypass graft)     12/12 with Dr. Laneta Simmers: LIMA-LAD, SVG-RI, SVG-OM  . Atrial fibrillation     post op after CABG; tx with amiodarone  . HTN (hypertension)   . HLD (hyperlipidemia)     Current Outpatient Prescriptions  Medication Sig Dispense Refill  . amiodarone (PACERONE) 400 MG tablet Take 100 mg by mouth 2 (two) times daily.        Marland Kitchen aspirin 325 MG tablet Take 1 tablet (325 mg total) by mouth daily.      . metoprolol tartrate (LOPRESSOR) 25 MG tablet Take 1 tablet (25 mg total) by mouth 2 (two) times  daily.  60 tablet  1  . rosuvastatin (CRESTOR) 40 MG tablet Take 1 tablet (40 mg total) by mouth daily.  30 tablet  1    Allergies: Allergies  Allergen Reactions  . Dilaudid (Hydromorphone Hcl) Hypertension    Back and forth with hyper and hypotension    History  Substance Use Topics  . Smoking status: Former Smoker    Quit date: 05/12/1968  . Smokeless tobacco: Never Used  . Alcohol Use: Yes     occasional     PHYSICAL EXAM: VS:  BP 124/74  Pulse 66  Ht 5\' 9"  (1.753 m)  Wt 191 lb (86.637 kg)  BMI 28.21 kg/m2 Well nourished, well developed, in no acute distress HEENT: normal Neck: no JVD Cardiac:  normal S1, S2; RRR; no murmur Chest: median sternotomy wound well healed without erythema or dishcarge Lungs:  clear to auscultation bilaterally, no wheezing, rhonchi or rales Abd: soft, nontender, no hepatomegaly Ext: no edema; RFA site without hematoma or bruit Skin: warm and dry Neuro:  CNs 2-12 intact, no focal abnormalities noted  EKG:   NSR, HR 66, RAD, NSSTTW changes,   ASSESSMENT AND PLAN:

## 2011-02-10 NOTE — Assessment & Plan Note (Signed)
Check lipids and LFTs in 6 weeks. 

## 2011-02-10 NOTE — Assessment & Plan Note (Signed)
Doing well post bypass.  Continue aspirin and statin.  He is not interested in cardiac rehabilitation.  I have encouraged him to continue to increase his activity on his own.  If he changes his mind regarding cardiac rehabilitation, he will contact us.  Followup with Dr. Antoine Poche in 6 weeks.

## 2011-02-10 NOTE — Assessment & Plan Note (Signed)
Maintaining sinus rhythm.  Continue aspirin.  We will confirm that he is on amiodarone 200 mg a day.  This can be discontinued and followup if he remains in sinus rhythm.

## 2011-02-10 NOTE — Assessment & Plan Note (Signed)
Controlled.  

## 2011-02-10 NOTE — Assessment & Plan Note (Signed)
TSH high in the hospital but free T4 normal.  Given that he is on amiodarone, I will repeat his TSH and free T4 today.  I will also refer him to primary care.

## 2011-02-10 NOTE — Patient Instructions (Addendum)
You have been referred to [facility].  Please make an appointment for general healthcare management  Your physician recommends that you schedule a follow-up appointment in: 6 weeks with Dr. Antoine Poche  Your physician recommends that you return for lab work in: 6 weeks FASTING lipid panel and LFTs; (Dx 272.4) , on the same day as your follow up appointment with Dr. Antoine Poche  Please have blood work done today: TSH, FT4; Dx: 244.9  Your physician has recommended you make the following change in your medication: Take Amiodarone 400 mg, 1/2 tablet once daily.

## 2011-02-11 LAB — T4, FREE: Free T4: 0.8 ng/dL (ref 0.60–1.60)

## 2011-02-11 LAB — TSH: TSH: 14 u[IU]/mL — ABNORMAL HIGH (ref 0.35–5.50)

## 2011-02-14 ENCOUNTER — Ambulatory Visit
Admission: RE | Admit: 2011-02-14 | Discharge: 2011-02-14 | Disposition: A | Discharge status: Home / Self Care | Payer: BC Managed Care – PPO | Source: Ambulatory Visit | Attending: Surgery | Admitting: Surgery

## 2011-02-14 ENCOUNTER — Ambulatory Visit (INDEPENDENT_AMBULATORY_CARE_PROVIDER_SITE_OTHER): Payer: Self-pay | Admitting: Surgical

## 2011-02-14 VITALS — BP 133/85 | HR 68 | Resp 16 | Ht 69.0 in | Wt 191.0 lb

## 2011-02-14 DIAGNOSIS — I2510 Atherosclerotic heart disease of native coronary artery without angina pectoris: Secondary | ICD-10-CM

## 2011-02-14 DIAGNOSIS — Z09 Encounter for follow-up examination after completed treatment for conditions other than malignant neoplasm: Secondary | ICD-10-CM

## 2011-02-14 MED ORDER — LISINOPRIL 10 MG PO TABS: 10.0000 mg | ORAL_TABLET | Freq: Every day | ORAL | Status: DC

## 2011-02-14 NOTE — Progress Notes (Addendum)
  HPI:  Patient returns for routine postoperative follow-up having undergone CABGx3 on 01/19/2011. The patient's early postoperative recovery while in the hospital was notable for atrial fibrillation. Overall, the patient feels fairly well. He denies any chest pain, shortness of breath, fever, or chills.   Current Outpatient Prescriptions  Medication Sig Dispense Refill  . amiodarone (PACERONE) 400 MG tablet Take 0.5 tablets (200 mg total) by mouth daily.  30 tablet  3  . aspirin 325 MG tablet Take 1 tablet (325 mg total) by mouth daily.      . metoprolol tartrate (LOPRESSOR) 25 MG tablet Take 1 tablet (25 mg total) by mouth 2 (two) times daily.  60 tablet  1  . rosuvastatin (CRESTOR) 40 MG tablet Take 1 tablet (40 mg total) by mouth daily.  30 tablet  1   Vital Signs: BP 133/85, RR 16, HR 68, O2 Sat 98% RA  Physical Exam Cardiovascular: Regular rate and rhythm, S1-S2 without any murmurs, gallops, or rubs. Pulmonary: Right lung clear; slightly decreased breath sounds at the left base Extremities: No cyanosis, clubbing, or edema Wounds: Sternum is solid. Wound clean dry well-healed. There is a remnant of the stitch which was removed without difficulty. Right lower extremity wounds clean and dry. There was a small eschar on the proximal right lower extremity wound which, was removed without difficulty.  Diagnostic Tests: PA lateral chest x-ray done today showed residual, small left pleural effusion and associated mild passive atelectasis of the left lower lobe; right lung clear; pulmonary vasculature normal; mild pectus carinatum sternal deformity, and no pneumothorax.   Impression and Plan: Overall, patient is surgically stable status post CABG x3. He is not taking any narcotics for pain. He was instructed he may begin driving short distances (i.e. 30 minutes or less during the day). He may then gradually increase his frequency and duration as tolerates. He was also instructed he may begin  anticipate in cardiac rehabilitation.Finally, he was instructed he must continue his sternal precautions in (i.e. no lifting more than 10 pounds for the next 4-6 weeks). Because the patient had initially presented with a non-stemi, he was also started on low-dose Lisinopril 10 mg by mouth daily today. Hopefully his amiodarone will be discontinued at his next appointment the cardiologist as he is maintaining sinus rhythm. Patient was already seen by his cardiologist in followup and has another followup appointment on February 7. He will return to see Dr. Laneta Simmers in approximately 3 weeks, at which time he will most likely be discharged from the practice.

## 2011-02-18 ENCOUNTER — Telehealth: Payer: Self-pay | Admitting: *Deleted

## 2011-02-18 DIAGNOSIS — R7989 Other specified abnormal findings of blood chemistry: Secondary | ICD-10-CM

## 2011-02-18 NOTE — Telephone Encounter (Signed)
Message copied by Antony Odea on Fri Feb 18, 2011  2:12 PM ------      Message from: [place], [place] T      Created: Fri Feb 18, 2011  1:40 PM       TSH high and Free T4 ok      Stop amiodarone.      Repeat TSH and Free T4 and Free T3 in 3-4 weeks.      Tereso Newcomer, PA-C  1:39 PM 02/18/2011

## 2011-02-18 NOTE — Telephone Encounter (Signed)
Pt aware to stop amiodarone lab draw set.

## 2011-02-24 ENCOUNTER — Ambulatory Visit (HOSPITAL_COMMUNITY): Payer: BC Managed Care – PPO

## 2011-02-28 ENCOUNTER — Ambulatory Visit (HOSPITAL_COMMUNITY): Payer: BC Managed Care – PPO

## 2011-03-02 ENCOUNTER — Ambulatory Visit (HOSPITAL_COMMUNITY): Payer: BC Managed Care – PPO

## 2011-03-04 ENCOUNTER — Ambulatory Visit (HOSPITAL_COMMUNITY): Payer: BC Managed Care – PPO

## 2011-03-07 ENCOUNTER — Ambulatory Visit (HOSPITAL_COMMUNITY): Payer: BC Managed Care – PPO

## 2011-03-08 ENCOUNTER — Ambulatory Visit (INDEPENDENT_AMBULATORY_CARE_PROVIDER_SITE_OTHER): Payer: Self-pay | Admitting: Surgery

## 2011-03-08 ENCOUNTER — Encounter: Payer: Self-pay | Admitting: Surgery

## 2011-03-08 VITALS — BP 123/80 | HR 74 | Resp 16 | Ht 69.0 in | Wt 190.0 lb

## 2011-03-08 DIAGNOSIS — Z951 Presence of aortocoronary bypass graft: Secondary | ICD-10-CM | POA: Insufficient documentation

## 2011-03-08 NOTE — Progress Notes (Signed)
                   [address]            [name] [address]          [phone]      HPI: Patient returns for routine postoperative follow-up having undergone coronary bypass graft surgery x3 on 01/19/2011. The patient's early postoperative recovery while in the hospital was notable for postoperative atrial fibrillation. Since hospital discharge the patient reports that he feels well. He is walking about 30 minutes twice daily without chest pain or shortness of breath. He is anxious to return to work.   Current Outpatient Prescriptions  Medication Sig Dispense Refill  . aspirin 325 MG tablet Take 1 tablet (325 mg total) by mouth daily.      Marland Kitchen lisinopril (PRINIVIL,ZESTRIL) 10 MG tablet Take 1 tablet (10 mg total) by mouth daily.  30 tablet  1  . metoprolol tartrate (LOPRESSOR) 25 MG tablet Take 1 tablet (25 mg total) by mouth 2 (two) times daily.  60 tablet  1  . rosuvastatin (CRESTOR) 40 MG tablet Take 1 tablet (40 mg total) by mouth daily.  30 tablet  1    Physical Exam:  BP 123/80  Pulse 74  Resp 16  Ht 5\' 9"  (1.753 m)  Wt 190 lb (86.183 kg)  BMI 28.06 kg/m2  SpO2 98%  He looks well. Cardiac exam shows a regular rate and rhythm with normal heart sounds. Lung exam is clear. Chest incision is healing well and sternum is stable. His leg incisions healing well and there is no lower extremity edema.  Diagnostic Tests:  Chest x-ray was not done today  Impression:  He is recovering very well following coronary bypass graft surgery. I told him he can return to work as of 03/28/2011.  Plan:  He'll continue to followup with Dr. Riley Kill and will contact me if he develops any problems with his incisions.

## 2011-03-09 ENCOUNTER — Ambulatory Visit (HOSPITAL_COMMUNITY): Payer: BC Managed Care – PPO

## 2011-03-11 ENCOUNTER — Ambulatory Visit (HOSPITAL_COMMUNITY): Payer: BC Managed Care – PPO

## 2011-03-14 ENCOUNTER — Ambulatory Visit (HOSPITAL_COMMUNITY): Payer: BC Managed Care – PPO

## 2011-03-16 ENCOUNTER — Ambulatory Visit (HOSPITAL_COMMUNITY): Payer: BC Managed Care – PPO

## 2011-03-18 ENCOUNTER — Ambulatory Visit (HOSPITAL_COMMUNITY): Payer: BC Managed Care – PPO

## 2011-03-19 ENCOUNTER — Encounter: Payer: Self-pay | Admitting: Internal Medicine

## 2011-03-19 DIAGNOSIS — Z0000 Encounter for general adult medical examination without abnormal findings: Secondary | ICD-10-CM | POA: Insufficient documentation

## 2011-03-19 DIAGNOSIS — Z0001 Encounter for general adult medical examination with abnormal findings: Secondary | ICD-10-CM | POA: Insufficient documentation

## 2011-03-19 DIAGNOSIS — M519 Unspecified thoracic, thoracolumbar and lumbosacral intervertebral disc disorder: Secondary | ICD-10-CM | POA: Insufficient documentation

## 2011-03-19 DIAGNOSIS — M5430 Sciatica, unspecified side: Secondary | ICD-10-CM

## 2011-03-19 HISTORY — DX: Sciatica, unspecified side: M54.30

## 2011-03-19 HISTORY — DX: Unspecified thoracic, thoracolumbar and lumbosacral intervertebral disc disorder: M51.9

## 2011-03-21 ENCOUNTER — Ambulatory Visit (HOSPITAL_COMMUNITY): Payer: BC Managed Care – PPO

## 2011-03-22 ENCOUNTER — Other Ambulatory Visit: Payer: Self-pay | Admitting: Physician Assistant

## 2011-03-22 ENCOUNTER — Encounter: Payer: Self-pay | Admitting: Internal Medicine

## 2011-03-22 ENCOUNTER — Ambulatory Visit (INDEPENDENT_AMBULATORY_CARE_PROVIDER_SITE_OTHER): Payer: BC Managed Care – PPO | Admitting: Internal Medicine

## 2011-03-22 VITALS — BP 110/70 | HR 63 | Temp 97.0°F | Ht 69.0 in | Wt 189.0 lb

## 2011-03-22 DIAGNOSIS — E039 Hypothyroidism, unspecified: Secondary | ICD-10-CM

## 2011-03-22 DIAGNOSIS — I10 Essential (primary) hypertension: Secondary | ICD-10-CM

## 2011-03-22 DIAGNOSIS — Z23 Encounter for immunization: Secondary | ICD-10-CM

## 2011-03-22 DIAGNOSIS — N320 Bladder-neck obstruction: Secondary | ICD-10-CM

## 2011-03-22 DIAGNOSIS — L409 Psoriasis, unspecified: Secondary | ICD-10-CM

## 2011-03-22 DIAGNOSIS — R7302 Impaired glucose tolerance (oral): Secondary | ICD-10-CM | POA: Insufficient documentation

## 2011-03-22 DIAGNOSIS — R7309 Other abnormal glucose: Secondary | ICD-10-CM

## 2011-03-22 DIAGNOSIS — D649 Anemia, unspecified: Secondary | ICD-10-CM

## 2011-03-22 DIAGNOSIS — Z0000 Encounter for general adult medical examination without abnormal findings: Secondary | ICD-10-CM

## 2011-03-22 HISTORY — DX: Psoriasis, unspecified: L40.9

## 2011-03-22 NOTE — Progress Notes (Signed)
Subjective:    Patient ID: Karlon Schlafer, male    DOB: November 15, 1945, 66 y.o.   MRN: [id number]  HPI  Here to establish as new pt;   Here to f/u; overall doing ok,  Pt denies chest pain, increased sob or doe, wheezing, orthopnea, PND, increased LE swelling, palpitations, dizziness or syncope.  Pt denies new neurological symptoms such as new headache, or facial or extremity weakness or numbness   Pt denies polydipsia, polyuria,.  Pt states overall good compliance with meds, trying to follow lower cholesterol diet, wt overall stable but little exercise however.  Does mention hx of hemorhoids 66 yo with ext hem surgury;  Has been having some drainage from the rectum such that he has some incont of stool with some leakage wihtin an hour of BM.  Has had no blood, but also no screening colonoscopy.   Pt denies fever, wt loss, night sweats, loss of appetite, or other constitutional symptoms  Overall good compliance with treatment, and good medicine tolerability. Past Medical History  Diagnosis Date  . Coronary artery disease     s/p NSTEMI 12/12;  LHC 01/13/11: oLAD 50% and prox 20%, oRI 40% and prox 40%, pCFX 40%, pOM1 95%, EF 55-65%.  Intervention of the obtuse marginal was felt to be too complex   . S/P CABG (coronary artery bypass graft)     12/12 with Dr. Laneta Simmers: LIMA-LAD, SVG-RI, SVG-OM  . Atrial fibrillation     post op after CABG; tx with amiodarone  . HTN (hypertension)   . HLD (hyperlipidemia)   . Sciatica 03/19/2011  . Lumbar disc disease 03/19/2011   Past Surgical History  Procedure Date  . Tonsillectomy   . Coronary artery bypass graft 01/19/2011    Procedure: CORONARY ARTERY BYPASS GRAFTING (CABG);  Surgeon: Alleen Borne, MD;  Location: [facility] OR;  Service: Open Heart Surgery;  Laterality: N/A;    reports that he quit smoking about 42 years ago. He has never used smokeless tobacco. He reports that he drinks alcohol. He reports that he does not use illicit drugs. family history includes Cancer  in his mother; Heart disease in his father and mother; and Hyperlipidemia in his father. Allergies  Allergen Reactions  . Dilaudid (Hydromorphone Hcl) Hypertension    Back and forth with hyper and hypotension   Current Outpatient Prescriptions on File Prior to Visit  Medication Sig Dispense Refill  . aspirin 325 MG tablet Take 1 tablet (325 mg total) by mouth daily.      Marland Kitchen lisinopril (PRINIVIL,ZESTRIL) 10 MG tablet Take 1 tablet (10 mg total) by mouth daily.  30 tablet  1  . metoprolol tartrate (LOPRESSOR) 25 MG tablet Take 1 tablet (25 mg total) by mouth 2 (two) times daily.  60 tablet  1  . rosuvastatin (CRESTOR) 40 MG tablet Take 1 tablet (40 mg total) by mouth daily.  30 tablet  1   Review of Systems Review of Systems  Constitutional: Negative for diaphoresis and unexpected weight change.  HENT: Negative for drooling and tinnitus.   Eyes: Negative for photophobia and visual disturbance.  Respiratory: Negative for choking and stridor.   Gastrointestinal: Negative for vomiting and blood in stool.  Genitourinary: Negative for hematuria and decreased urine volume.  Musculoskeletal: Negative for gait problem.  Skin: Negative for color change and wound.  Neurological: Negative for tremors and numbness.  Psychiatric/Behavioral: Negative for decreased concentration. The patient is not hyperactive.       Objective:   Physical Exam BP  110/70  Pulse 63  Temp(Src) 97 F (36.1 C) (Oral)  Ht 5\' 9"  (1.753 m)  Wt 189 lb (85.73 kg)  BMI 27.91 kg/m2  SpO2 98% Physical Exam  VS noted Constitutional: Pt appears well-developed and well-nourished.  HENT: Head: Normocephalic.  Right Ear: External ear normal.  Left Ear: External ear normal.  Eyes: Conjunctivae and EOM are normal. Pupils are equal, round, and reactive to light.  Neck: Normal range of motion. Neck supple.  Cardiovascular: Normal rate and regular rhythm.   Pulmonary/Chest: Effort normal and breath sounds normal.  Abd:  Soft,  NT, non-distended, + BS Neurological: Pt is alert. No cranial nerve deficit.  Skin: Skin is warm. No erythema.  Psychiatric: Pt behavior is normal. Thought content normal.     Assessment & Plan:

## 2011-03-22 NOTE — Assessment & Plan Note (Addendum)
Not charged today, but due for screening colonsocpy - will refer, will need to be ok for off ASA per card prior; also for pneumonia shot

## 2011-03-22 NOTE — Assessment & Plan Note (Signed)
Mild, recent improved postop; for f/u but likely normal again by this time

## 2011-03-22 NOTE — Patient Instructions (Addendum)
Please plan to have your blood work from today done with your other planned blood work on Thursday later this week Please call the phone number [phone] (the [employer] System) for results of testing in 2-3 days;  When calling, simply dial the number, and when prompted enter the MRN number above (the Medical Record Number) and the # key, then the message should start. Continue all other medications as before Please keep your appointments with your specialists as you have planned - Cardiology You had the pneumonia shot today You will be contacted regarding the referral for: screening colonoscopy Please be aware that Aspirin is normally stopped about 5 days before a colonoscopy, so this would have to be OK with Dr Antoine Poche before the colonoscopy should be scheduled

## 2011-03-22 NOTE — Assessment & Plan Note (Addendum)
Minor with recent hospn - for lab f/u, cont diet and wt control,  Lab Results  Component Value Date   HGBA1C 5.5 03/24/2011

## 2011-03-23 ENCOUNTER — Ambulatory Visit (HOSPITAL_COMMUNITY): Payer: BC Managed Care – PPO

## 2011-03-24 ENCOUNTER — Other Ambulatory Visit: Payer: Self-pay | Admitting: Internal Medicine

## 2011-03-24 ENCOUNTER — Encounter: Payer: Self-pay | Admitting: Cardiology

## 2011-03-24 ENCOUNTER — Ambulatory Visit (INDEPENDENT_AMBULATORY_CARE_PROVIDER_SITE_OTHER): Payer: BC Managed Care – PPO | Admitting: Cardiology

## 2011-03-24 ENCOUNTER — Other Ambulatory Visit: Payer: BC Managed Care – PPO | Admitting: *Deleted

## 2011-03-24 DIAGNOSIS — D649 Anemia, unspecified: Secondary | ICD-10-CM

## 2011-03-24 DIAGNOSIS — E039 Hypothyroidism, unspecified: Secondary | ICD-10-CM

## 2011-03-24 DIAGNOSIS — E785 Hyperlipidemia, unspecified: Secondary | ICD-10-CM

## 2011-03-24 DIAGNOSIS — R7302 Impaired glucose tolerance (oral): Secondary | ICD-10-CM

## 2011-03-24 DIAGNOSIS — I10 Essential (primary) hypertension: Secondary | ICD-10-CM

## 2011-03-24 DIAGNOSIS — Z951 Presence of aortocoronary bypass graft: Secondary | ICD-10-CM

## 2011-03-24 DIAGNOSIS — N320 Bladder-neck obstruction: Secondary | ICD-10-CM

## 2011-03-24 DIAGNOSIS — R7309 Other abnormal glucose: Secondary | ICD-10-CM

## 2011-03-24 LAB — LIPID PANEL
HDL: 37.6 mg/dL — ABNORMAL LOW (ref >39.00)
LDL Cholesterol: 56 mg/dL (ref 0–99)
Total CHOL/HDL Ratio: 3
Triglycerides: 108 mg/dL (ref 0.0–149.0)

## 2011-03-24 LAB — CBC WITH DIFFERENTIAL/PLATELET
Eosinophils Relative: 2.8 % (ref 0.0–5.0)
Lymphocytes Relative: 21.5 % (ref 12.0–46.0)
Monocytes Absolute: 0.4 10*3/uL (ref 0.1–1.0)
Monocytes Relative: 7 % (ref 3.0–12.0)
Neutrophils Relative %: 68.1 % (ref 43.0–77.0)
Platelets: 164 10*3/uL (ref 150.0–400.0)
RBC: 4.35 Mil/uL (ref 4.22–5.81)
WBC: 5.9 10*3/uL (ref 4.5–10.5)

## 2011-03-24 LAB — PSA: PSA: 1.65 ng/mL (ref 0.10–4.00)

## 2011-03-24 LAB — BASIC METABOLIC PANEL
Chloride: 104 mEq/L (ref 96–112)
GFR: 57.77 mL/min — ABNORMAL LOW (ref >60.00)
Glucose, Bld: 96 mg/dL (ref 70–99)
Potassium: 4.4 mEq/L (ref 3.5–5.1)
Sodium: 140 mEq/L (ref 135–145)

## 2011-03-24 LAB — HEPATIC FUNCTION PANEL
ALT: 26 U/L (ref 0–53)
Alkaline Phosphatase: 54 U/L (ref 39–117)
Bilirubin, Direct: 0.1 mg/dL (ref 0.0–0.3)
Total Bilirubin: 0.7 mg/dL (ref 0.3–1.2)

## 2011-03-24 MED ORDER — ROSUVASTATIN CALCIUM 20 MG PO TABS: 20.0000 mg | ORAL_TABLET | Freq: Every day | ORAL | Status: DC

## 2011-03-24 MED ORDER — LEVOTHYROXINE SODIUM 25 MCG PO TABS: 25.0000 ug | ORAL_TABLET | Freq: Every day | ORAL | Status: DC

## 2011-03-24 NOTE — Assessment & Plan Note (Signed)
I am not sure that the symptoms described above related to the Crestor but I will reduce the dose to 20 mg. He can have a repeat lipid in 6 weeks. He will let me know if his symptoms resolved.

## 2011-03-24 NOTE — Patient Instructions (Signed)
Please reduce your Crestor to 20 mg a day Continue all other medications as listed  Follow up in 6 months with Dr Antoine Poche.  You will receive a letter in the mail 2 months before you are due.  Please call us when you receive this letter to schedule your follow up appointment.

## 2011-03-24 NOTE — Progress Notes (Signed)
   HPI The patient returns for followup after bypass surgery. He is doing quite well. He denies any chest pressure, neck or arm discomfort. He's had no shortness of breath, PND or orthopnea. He has had no palpitations, presyncope or syncope. He is walking on his own. He does have some sensations at night with difficulty falling asleep and dizziness and wonders if this Crestor.  Allergies  Allergen Reactions  . Dilaudid (Hydromorphone Hcl) Hypertension    Back and forth with hyper and hypotension    Current Outpatient Prescriptions  Medication Sig Dispense Refill  . aspirin 325 MG tablet Take 1 tablet (325 mg total) by mouth daily.      Marland Kitchen lisinopril (PRINIVIL,ZESTRIL) 10 MG tablet Take 1 tablet (10 mg total) by mouth daily.  30 tablet  1  . metoprolol tartrate (LOPRESSOR) 25 MG tablet Take 1 tablet (25 mg total) by mouth 2 (two) times daily.  60 tablet  1  . rosuvastatin (CRESTOR) 40 MG tablet Take 1 tablet (40 mg total) by mouth daily.  30 tablet  1    Past Medical History  Diagnosis Date  . Coronary artery disease     s/p NSTEMI 12/12;  LHC 01/13/11: oLAD 50% and prox 20%, oRI 40% and prox 40%, pCFX 40%, pOM1 95%, EF 55-65%.  Intervention of the obtuse marginal was felt to be too complex   . S/P CABG (coronary artery bypass graft)     12/12 with Dr. Laneta Simmers: LIMA-LAD, SVG-RI, SVG-OM  . Atrial fibrillation     post op after CABG; tx with amiodarone  . HTN (hypertension)   . HLD (hyperlipidemia)   . Sciatica 03/19/2011  . Lumbar disc disease 03/19/2011  . Psoriasis 03/22/2011    Past Surgical History  Procedure Date  . Tonsillectomy   . Coronary artery bypass graft 01/19/2011    Procedure: CORONARY ARTERY BYPASS GRAFTING (CABG);  Surgeon: Alleen Borne, MD;  Location: [facility] OR;  Service: Open Heart Surgery;  Laterality: N/A;    ROS:  As stated in the HPI and negative for all other systems.  PHYSICAL EXAM BP 110/80  Pulse 80  Ht 5\' 9"  (1.753 m)  Wt 190 lb (86.183 kg)  BMI 28.06  kg/m2 GENERAL:  Well appearing HEENT:  Pupils equal round and reactive, fundi not visualized, oral mucosa unremarkable NECK:  No jugular venous distention, waveform within normal limits, carotid upstroke brisk and symmetric, no bruits, no thyromegaly LYMPHATICS:  No cervical, inguinal adenopathy LUNGS:  Clear to auscultation bilaterally BACK:  No CVA tenderness CHEST:  Well healed sternotomy scar. HEART:  PMI not displaced or sustained,S1 and S2 within normal limits, no S3, no S4, no clicks, no rubs, no murmurs ABD:  Flat, positive bowel sounds normal in frequency in pitch, no bruits, no rebound, no guarding, no midline pulsatile mass, no hepatomegaly, no splenomegaly EXT:  2 plus pulses throughout, no edema, no cyanosis no clubbing SKIN:  No rashes no nodules NEURO:  Cranial nerves II through XII grossly intact, motor grossly intact throughout PSYCH:  Cognitively intact, oriented to person place and time  Lab Results  Component Value Date   CHOL 191 01/14/2011   HDL 35* 01/14/2011   LDLCALC 132* 01/14/2011   TRIG 120 01/14/2011   CHOLHDL 5.5 01/14/2011    ASSESSMENT AND PLAN

## 2011-03-24 NOTE — Assessment & Plan Note (Signed)
He has a TSH pending today and this is followed by Dr. Jonny Ruiz and

## 2011-03-24 NOTE — Assessment & Plan Note (Signed)
He is doing well post bypass and no change in therapy other than directly below it is suggested.

## 2011-03-24 NOTE — Assessment & Plan Note (Signed)
The blood pressure is at target. No change in medications is indicated. We will continue with therapeutic lifestyle changes (TLC).l

## 2011-03-25 ENCOUNTER — Telehealth: Payer: Self-pay | Admitting: *Deleted

## 2011-03-25 ENCOUNTER — Ambulatory Visit (HOSPITAL_COMMUNITY): Payer: BC Managed Care – PPO

## 2011-03-25 NOTE — Telephone Encounter (Signed)
03/25/11--results of lab work given to pt--nt

## 2011-03-25 NOTE — Telephone Encounter (Signed)
Do I need to do something with this? Tereso Newcomer, PA-C  2:40 PM 03/25/2011

## 2011-03-26 ENCOUNTER — Other Ambulatory Visit: Payer: Self-pay | Admitting: Cardiology

## 2011-03-27 ENCOUNTER — Encounter: Payer: Self-pay | Admitting: Internal Medicine

## 2011-03-27 NOTE — Assessment & Plan Note (Signed)
asympt  - also due for psa

## 2011-03-27 NOTE — Assessment & Plan Note (Signed)
stable overall by hx and exam, most recent data reviewed with pt, and pt to continue medical treatment as before, may need tx if persistent or worse Lab Results  Component Value Date   TSH 17.83* 03/24/2011

## 2011-03-27 NOTE — Assessment & Plan Note (Signed)
stable overall by hx and exam, most recent data reviewed with pt, and pt to continue medical treatment as before BP Readings from Last 3 Encounters:  03/24/11 110/80  03/22/11 110/70  03/08/11 123/80

## 2011-03-28 ENCOUNTER — Ambulatory Visit (HOSPITAL_COMMUNITY): Payer: BC Managed Care – PPO

## 2011-03-30 ENCOUNTER — Ambulatory Visit (HOSPITAL_COMMUNITY): Payer: BC Managed Care – PPO

## 2011-04-01 ENCOUNTER — Ambulatory Visit (HOSPITAL_COMMUNITY): Payer: BC Managed Care – PPO

## 2011-04-04 ENCOUNTER — Ambulatory Visit (HOSPITAL_COMMUNITY): Payer: BC Managed Care – PPO

## 2011-04-06 ENCOUNTER — Ambulatory Visit (HOSPITAL_COMMUNITY): Payer: BC Managed Care – PPO

## 2011-04-08 ENCOUNTER — Ambulatory Visit (HOSPITAL_COMMUNITY): Payer: BC Managed Care – PPO

## 2011-04-11 ENCOUNTER — Other Ambulatory Visit: Payer: Self-pay | Admitting: Cardiology

## 2011-04-11 ENCOUNTER — Ambulatory Visit (HOSPITAL_COMMUNITY): Payer: BC Managed Care – PPO

## 2011-04-13 ENCOUNTER — Ambulatory Visit (HOSPITAL_COMMUNITY): Payer: BC Managed Care – PPO

## 2011-04-15 ENCOUNTER — Ambulatory Visit (HOSPITAL_COMMUNITY): Payer: BC Managed Care – PPO

## 2011-04-18 ENCOUNTER — Ambulatory Visit (HOSPITAL_COMMUNITY): Payer: BC Managed Care – PPO

## 2011-04-20 ENCOUNTER — Ambulatory Visit (HOSPITAL_COMMUNITY): Payer: BC Managed Care – PPO

## 2011-04-22 ENCOUNTER — Ambulatory Visit (HOSPITAL_COMMUNITY): Payer: BC Managed Care – PPO

## 2011-04-25 ENCOUNTER — Ambulatory Visit (HOSPITAL_COMMUNITY): Payer: BC Managed Care – PPO

## 2011-04-27 ENCOUNTER — Ambulatory Visit (HOSPITAL_COMMUNITY): Payer: BC Managed Care – PPO

## 2011-04-29 ENCOUNTER — Ambulatory Visit (HOSPITAL_COMMUNITY): Payer: BC Managed Care – PPO

## 2011-05-02 ENCOUNTER — Ambulatory Visit (HOSPITAL_COMMUNITY): Payer: BC Managed Care – PPO

## 2011-05-04 ENCOUNTER — Ambulatory Visit (HOSPITAL_COMMUNITY): Payer: BC Managed Care – PPO

## 2011-05-06 ENCOUNTER — Ambulatory Visit (HOSPITAL_COMMUNITY): Payer: BC Managed Care – PPO

## 2011-05-09 ENCOUNTER — Ambulatory Visit (HOSPITAL_COMMUNITY): Payer: BC Managed Care – PPO

## 2011-05-11 ENCOUNTER — Ambulatory Visit (HOSPITAL_COMMUNITY): Payer: BC Managed Care – PPO

## 2011-05-13 ENCOUNTER — Ambulatory Visit (HOSPITAL_COMMUNITY): Payer: BC Managed Care – PPO

## 2011-05-16 ENCOUNTER — Ambulatory Visit (HOSPITAL_COMMUNITY): Payer: BC Managed Care – PPO

## 2011-05-18 ENCOUNTER — Ambulatory Visit (HOSPITAL_COMMUNITY): Payer: BC Managed Care – PPO

## 2011-05-20 ENCOUNTER — Ambulatory Visit (HOSPITAL_COMMUNITY): Payer: BC Managed Care – PPO

## 2011-05-23 ENCOUNTER — Ambulatory Visit (HOSPITAL_COMMUNITY): Payer: BC Managed Care – PPO

## 2011-05-25 ENCOUNTER — Ambulatory Visit (HOSPITAL_COMMUNITY): Payer: BC Managed Care – PPO

## 2011-05-26 ENCOUNTER — Ambulatory Visit (INDEPENDENT_AMBULATORY_CARE_PROVIDER_SITE_OTHER): Payer: Medicare Other | Admitting: Internal Medicine

## 2011-05-26 ENCOUNTER — Other Ambulatory Visit (INDEPENDENT_AMBULATORY_CARE_PROVIDER_SITE_OTHER): Payer: Medicare Other

## 2011-05-26 ENCOUNTER — Encounter: Payer: Self-pay | Admitting: Internal Medicine

## 2011-05-26 VITALS — BP 110/62 | HR 79 | Temp 98.3°F | Ht 69.0 in | Wt 195.4 lb

## 2011-05-26 DIAGNOSIS — R7302 Impaired glucose tolerance (oral): Secondary | ICD-10-CM

## 2011-05-26 DIAGNOSIS — I10 Essential (primary) hypertension: Secondary | ICD-10-CM

## 2011-05-26 DIAGNOSIS — R7309 Other abnormal glucose: Secondary | ICD-10-CM

## 2011-05-26 DIAGNOSIS — E039 Hypothyroidism, unspecified: Secondary | ICD-10-CM

## 2011-05-26 LAB — TSH: TSH: 10.33 u[IU]/mL — ABNORMAL HIGH (ref 0.35–5.50)

## 2011-05-26 NOTE — Patient Instructions (Addendum)
Continue all other medications as before Please go to LAB in the Basement for the blood and/or urine tests to be done today You will be contacted by phone if any changes need to be made immediately.  Otherwise, you will receive a letter about your results with an explanation. Please return in 6 months, or sooner if needed  

## 2011-05-26 NOTE — Assessment & Plan Note (Signed)
stable overall by hx and exam, most recent data reviewed with pt, and pt to continue medical treatment as before  Lab Results  Component Value Date   HGBA1C 5.5 03/24/2011

## 2011-05-26 NOTE — Assessment & Plan Note (Signed)
stable overall by hx and exam, most recent data reviewed with pt, and pt to continue medical treatment as before  BP Readings from Last 3 Encounters:  05/26/11 110/62  03/24/11 110/80  03/22/11 110/70

## 2011-05-26 NOTE — Progress Notes (Signed)
Subjective:    Patient ID: Jonathon [name], male    DOB: July 26, 1945, 66 y.o.   MRN: [id number]  HPI  Here to f/u low thyroid; overall doing well;  No specific complaints;  Denies hyper or hypo thyroid symptoms such as voice, skin or hair change.  Pt denies chest pain, increased sob or doe, wheezing, orthopnea, PND, increased LE swelling, palpitations, dizziness or syncope.  Pt denies new neurological symptoms such as new headache, or facial or extremity weakness or numbness.   Pt denies polydipsia, polyuria.  Had crestor decr to 20 mg per cardiology, followed per Dr Antoine Poche.   Pt denies fever, wt loss, night sweats, loss of appetite, or other constitutional symptoms Past Medical History  Diagnosis Date  . Coronary artery disease     s/p NSTEMI 12/12;  LHC 01/13/11: oLAD 50% and prox 20%, oRI 40% and prox 40%, pCFX 40%, pOM1 95%, EF 55-65%.  Intervention of the obtuse marginal was felt to be too complex   . S/P CABG (coronary artery bypass graft)     12/12 with Dr. Laneta Simmers: LIMA-LAD, SVG-RI, SVG-OM  . Atrial fibrillation     post op after CABG; tx with amiodarone  . HTN (hypertension)   . HLD (hyperlipidemia)   . Sciatica 03/19/2011  . Lumbar disc disease 03/19/2011  . Psoriasis 03/22/2011   Past Surgical History  Procedure Date  . Tonsillectomy   . Coronary artery bypass graft 01/19/2011    Procedure: CORONARY ARTERY BYPASS GRAFTING (CABG);  Surgeon: Alleen Borne, MD;  Location: [facility] OR;  Service: Open Heart Surgery;  Laterality: N/A;    reports that he quit smoking about 43 years ago. He has never used smokeless tobacco. He reports that he drinks alcohol. He reports that he does not use illicit drugs. family history includes Cancer in his mother; Heart disease in his father and mother; and Hyperlipidemia in his father. Allergies  Allergen Reactions  . Dilaudid (Hydromorphone Hcl) Hypertension    Back and forth with hyper and hypotension   Current Outpatient Prescriptions on File Prior to  Visit  Medication Sig Dispense Refill  . aspirin 325 MG tablet Take 1 tablet (325 mg total) by mouth daily.      Marland Kitchen levothyroxine (LEVOTHROID) 25 MCG tablet Take 1 tablet (25 mcg total) by mouth daily.  90 tablet  3  . lisinopril (PRINIVIL,ZESTRIL) 10 MG tablet TAKE 1 TABLET BY MOUTH EVERY DAY  30 tablet  6  . metoprolol tartrate (LOPRESSOR) 25 MG tablet TAKE 1 TABLET BY MOUTH TWICE DAILY  60 tablet  5  . rosuvastatin (CRESTOR) 20 MG tablet Take 1 tablet (20 mg total) by mouth daily.  30 tablet  1   Review of Systems Review of Systems  Constitutional: Negative for diaphoresis and unexpected weight change.  Gastrointestinal: Negative for vomiting and blood in stool.  Genitourinary: Negative for hematuria and decreased urine volume.  Musculoskeletal: Negative for gait problem.  Skin: Negative for color change and wound.  Neurological: Negative for tremors and numbness.       Objective:   Physical Exam BP 110/62  Pulse 79  Temp(Src) 98.3 F (36.8 C) (Oral)  Ht 5\' 9"  (1.753 m)  Wt 195 lb 6 oz (88.622 kg)  BMI 28.85 kg/m2  SpO2 96% Physical Exam  VS noted, obese Eyes: Conjunctivae and EOM are normal. Pupils are equal, round, and reactive to light.  Neck: Normal range of motion. Neck supple.  Cardiovascular: Normal rate and regular rhythm.   Pulmonary/Chest:  Effort normal and breath sounds normal.  Abd:  Soft, NT, non-distended, + BS Neurological: Pt is alert. No cranial nerve deficit.  Skin: Skin is warm. No erythema.  Psychiatric: Pt behavior is normal. Thought content normal.     Assessment & Plan:

## 2011-05-26 NOTE — Assessment & Plan Note (Signed)
stable overall by hx and exam, most recent data reviewed with pt, and pt to continue medical treatment as before, for repeat TSH, consider incr levothyroxine to 50  last tsh - feb 2013  - 17.8

## 2011-05-27 ENCOUNTER — Encounter: Payer: Self-pay | Admitting: Internal Medicine

## 2011-05-27 ENCOUNTER — Other Ambulatory Visit: Payer: Self-pay | Admitting: Internal Medicine

## 2011-05-27 ENCOUNTER — Ambulatory Visit (HOSPITAL_COMMUNITY): Payer: BC Managed Care – PPO

## 2011-05-27 MED ORDER — LEVOTHYROXINE SODIUM 50 MCG PO TABS: 50.0000 ug | ORAL_TABLET | Freq: Every day | ORAL | Status: DC

## 2011-05-30 ENCOUNTER — Ambulatory Visit (HOSPITAL_COMMUNITY): Payer: BC Managed Care – PPO

## 2011-06-01 ENCOUNTER — Ambulatory Visit (HOSPITAL_COMMUNITY): Payer: BC Managed Care – PPO

## 2011-06-03 ENCOUNTER — Ambulatory Visit (HOSPITAL_COMMUNITY): Payer: Medicare Other

## 2011-06-20 ENCOUNTER — Encounter: Payer: Self-pay | Admitting: Gastroenterology

## 2011-07-27 ENCOUNTER — Telehealth: Payer: Self-pay | Admitting: Cardiology

## 2011-07-27 MED ORDER — ROSUVASTATIN CALCIUM 40 MG PO TABS: 40.0000 mg | ORAL_TABLET | Freq: Every day | ORAL | Status: DC

## 2011-07-27 NOTE — Telephone Encounter (Signed)
Pt aware that we can send in an RX for the 40 mg tablets and that he will take 1/2 tablet daily

## 2011-07-27 NOTE — Telephone Encounter (Signed)
New msg Pt wants to get 40 mg of crestor so he can cut in half. Because it is cheaper Please let him know

## 2011-09-30 ENCOUNTER — Ambulatory Visit (INDEPENDENT_AMBULATORY_CARE_PROVIDER_SITE_OTHER): Payer: BC Managed Care – PPO | Admitting: Cardiology

## 2011-09-30 ENCOUNTER — Encounter: Payer: Self-pay | Admitting: Cardiology

## 2011-09-30 VITALS — BP 118/80 | HR 56 | Ht 70.0 in | Wt 181.8 lb

## 2011-09-30 DIAGNOSIS — I10 Essential (primary) hypertension: Secondary | ICD-10-CM

## 2011-09-30 DIAGNOSIS — I4891 Unspecified atrial fibrillation: Secondary | ICD-10-CM

## 2011-09-30 DIAGNOSIS — I2510 Atherosclerotic heart disease of native coronary artery without angina pectoris: Secondary | ICD-10-CM

## 2011-09-30 DIAGNOSIS — I214 Non-ST elevation (NSTEMI) myocardial infarction: Secondary | ICD-10-CM

## 2011-09-30 DIAGNOSIS — E785 Hyperlipidemia, unspecified: Secondary | ICD-10-CM

## 2011-09-30 MED ORDER — ATORVASTATIN CALCIUM 40 MG PO TABS: 40.0000 mg | ORAL_TABLET | Freq: Every day | ORAL | Status: DC

## 2011-09-30 NOTE — Patient Instructions (Addendum)
Finish Crestor then start Lipitor 40 mg every night.Call office to schedule Lipid Panel 8 weeks after starting Lipitor.     Your physician wants you to follow-up in: 1 year. You will receive a reminder letter in the mail two months in advance. If you don't receive a letter, please call our office to schedule the follow-up appointment.

## 2011-09-30 NOTE — Progress Notes (Signed)
HPI The patient returns for followup after bypass surgery. He is doing quite well. He denies any chest pressure, neck or arm discomfort. He's had no shortness of breath, PND or orthopnea. He has had no palpitations, presyncope or syncope. He is walking on his own. He is not exercising as much as I would like.  We discussed getting some equipment in his office.    Allergies  Allergen Reactions  . Dilaudid (Hydromorphone Hcl) Hypertension    Back and forth with hyper and hypotension    Current Outpatient Prescriptions  Medication Sig Dispense Refill  . aspirin 325 MG tablet Take 1 tablet (325 mg total) by mouth daily.      Marland Kitchen levothyroxine (SYNTHROID, LEVOTHROID) 50 MCG tablet Take 1 tablet (50 mcg total) by mouth daily.  90 tablet  3  . lisinopril (PRINIVIL,ZESTRIL) 10 MG tablet TAKE 1 TABLET BY MOUTH EVERY DAY  30 tablet  6  . metoprolol tartrate (LOPRESSOR) 25 MG tablet TAKE 1 TABLET BY MOUTH TWICE DAILY  60 tablet  5  . rosuvastatin (CRESTOR) 40 MG tablet Take 40 mg by mouth daily. 1/2 tab daily      . DISCONTD: rosuvastatin (CRESTOR) 40 MG tablet Take 1 tablet (40 mg total) by mouth daily.  30 tablet  6    Past Medical History  Diagnosis Date  . Coronary artery disease     s/p NSTEMI 12/12;  LHC 01/13/11: oLAD 50% and prox 20%, oRI 40% and prox 40%, pCFX 40%, pOM1 95%, EF 55-65%.  Intervention of the obtuse marginal was felt to be too complex   . S/P CABG (coronary artery bypass graft)     12/12 with Dr. Laneta Simmers: LIMA-LAD, SVG-RI, SVG-OM  . Atrial fibrillation     post op after CABG; tx with amiodarone  . HTN (hypertension)   . HLD (hyperlipidemia)   . Sciatica 03/19/2011  . Lumbar disc disease 03/19/2011  . Psoriasis 03/22/2011    Past Surgical History  Procedure Date  . Tonsillectomy   . Coronary artery bypass graft 01/19/2011    Procedure: CORONARY ARTERY BYPASS GRAFTING (CABG);  Surgeon: Alleen Borne, MD;  Location: [facility] OR;  Service: Open Heart Surgery;  Laterality: N/A;     ROS:  As stated in the HPI and negative for all other systems.  PHYSICAL EXAM BP 118/80  Pulse 56  Ht 5\' 10"  (1.778 m)  Wt 181 lb 12.8 oz (82.464 kg)  BMI 26.09 kg/m2 GENERAL:  Well appearing NECK:  No jugular venous distention, waveform within normal limits, carotid upstroke brisk and symmetric, no bruits, no thyromegaly LYMPHATICS:  No cervical, inguinal adenopathy LUNGS:  Clear to auscultation bilaterally BACK:  No CVA tenderness CHEST:  Well healed sternotomy scar. HEART:  PMI not displaced or sustained,S1 and S2 within normal limits, no S3, no S4, no clicks, no rubs, no murmurs ABD:  Flat, positive bowel sounds normal in frequency in pitch, no bruits, no rebound, no guarding, no midline pulsatile mass, no hepatomegaly, no splenomegaly EXT:  2 plus pulses throughout, no edema, no cyanosis no clubbing   EKG:  Sinus rhythm, rate 56, axis within normal limits, intervals within normal limits, no acute ST-T wave changes.  09/30/2011   ASSESSMENT AND PLAN   S/P CABG x 3 -  The patient denies any new symptoms such as chest discomfort, neck or arm discomfort. There has been no new shortness of breath, PND or orthopnea. There have been no reported palpitations, presyncope or syncope.  Hypertension - The blood pressure is at target. No change in medications is indicated. We will continue with therapeutic lifestyle changes (TLC).   Hyperlipidemia -  He wants to switch to Lipitor for cost.  I will change to 40 mg Lipitor and check lipids and liver in 8 weeks.

## 2011-10-06 ENCOUNTER — Other Ambulatory Visit: Payer: Self-pay | Admitting: Cardiology

## 2011-10-06 NOTE — Telephone Encounter (Signed)
..   Requested Prescriptions   Pending Prescriptions Disp Refills  . metoprolol tartrate (LOPRESSOR) 25 MG tablet [Pharmacy Med Name: METOPROLOL TARTRATE 25MG  TABLETS] 60 tablet 10    Sig: TAKE 1 TABLET BY MOUTH TWICE DAILY

## 2011-11-14 ENCOUNTER — Other Ambulatory Visit: Payer: Self-pay | Admitting: Cardiology

## 2011-11-15 NOTE — Telephone Encounter (Signed)
..   Requested Prescriptions   Pending Prescriptions Disp Refills  . lisinopril (PRINIVIL,ZESTRIL) 10 MG tablet [Pharmacy Med Name: LISINOPRIL 10MG  TABLETS] 30 tablet 6    Sig: TAKE 1 TABLET BY MOUTH EVERY DAY

## 2011-11-24 ENCOUNTER — Ambulatory Visit: Payer: Medicare Other | Admitting: Internal Medicine

## 2011-11-24 DIAGNOSIS — Z0289 Encounter for other administrative examinations: Secondary | ICD-10-CM

## 2012-01-30 ENCOUNTER — Other Ambulatory Visit: Payer: Self-pay | Admitting: *Deleted

## 2012-01-30 ENCOUNTER — Telehealth: Payer: Self-pay | Admitting: Cardiology

## 2012-01-30 DIAGNOSIS — I10 Essential (primary) hypertension: Secondary | ICD-10-CM

## 2012-01-30 DIAGNOSIS — E785 Hyperlipidemia, unspecified: Secondary | ICD-10-CM

## 2012-01-30 NOTE — Telephone Encounter (Signed)
Labs ordered.

## 2012-01-30 NOTE — Telephone Encounter (Signed)
pt to have blood work after medication change, wants to come in tomorrow , can get order?

## 2012-01-31 ENCOUNTER — Other Ambulatory Visit (INDEPENDENT_AMBULATORY_CARE_PROVIDER_SITE_OTHER): Payer: BC Managed Care – PPO

## 2012-01-31 DIAGNOSIS — E785 Hyperlipidemia, unspecified: Secondary | ICD-10-CM

## 2012-01-31 DIAGNOSIS — I10 Essential (primary) hypertension: Secondary | ICD-10-CM

## 2012-01-31 LAB — BASIC METABOLIC PANEL
Calcium: 9.1 mg/dL (ref 8.4–10.5)
GFR: 71.12 mL/min (ref >60.00)
Glucose, Bld: 91 mg/dL (ref 70–99)
Sodium: 137 mEq/L (ref 135–145)

## 2012-01-31 LAB — LIPID PANEL
Cholesterol: 114 mg/dL (ref 0–200)
HDL: 35.8 mg/dL — ABNORMAL LOW (ref >39.00)
Triglycerides: 82 mg/dL (ref 0.0–149.0)

## 2012-01-31 LAB — HEPATIC FUNCTION PANEL
ALT: 22 U/L (ref 0–53)
AST: 26 U/L (ref 0–37)
Albumin: 4.1 g/dL (ref 3.5–5.2)
Total Bilirubin: 1.2 mg/dL (ref 0.3–1.2)
Total Protein: 6.9 g/dL (ref 6.0–8.3)

## 2012-05-14 ENCOUNTER — Other Ambulatory Visit: Payer: Self-pay | Admitting: Cardiology

## 2012-05-15 NOTE — Telephone Encounter (Signed)
..   Requested Prescriptions   Pending Prescriptions Disp Refills  . lisinopril (PRINIVIL,ZESTRIL) 10 MG tablet [Pharmacy Med Name: LISINOPRIL 10MG  TABLETS] 30 tablet 3    Sig: TAKE 1 TABLET BY MOUTH DAILY

## 2012-06-01 ENCOUNTER — Other Ambulatory Visit: Payer: Self-pay | Admitting: Internal Medicine

## 2012-06-01 NOTE — Telephone Encounter (Signed)
refill 

## 2012-06-13 ENCOUNTER — Other Ambulatory Visit: Payer: Self-pay | Admitting: Cardiology

## 2012-06-29 ENCOUNTER — Other Ambulatory Visit: Payer: Self-pay | Admitting: Cardiology

## 2012-07-07 ENCOUNTER — Encounter (HOSPITAL_COMMUNITY): Payer: Self-pay | Admitting: Vascular Surgery

## 2012-07-07 ENCOUNTER — Ambulatory Visit (HOSPITAL_COMMUNITY): Admit: 2012-07-07 | Payer: Self-pay | Admitting: Cardiology

## 2012-07-07 ENCOUNTER — Encounter (HOSPITAL_COMMUNITY)
Admission: EM | Disposition: A | Discharge status: Home / Self Care | Payer: Self-pay | Source: Home / Self Care | Attending: Cardiology

## 2012-07-07 ENCOUNTER — Inpatient Hospital Stay (HOSPITAL_COMMUNITY)
Admission: EM | Admit: 2012-07-07 | Discharge: 2012-07-09 | DRG: 853 | Disposition: A | Discharge status: Home / Self Care | Payer: BC Managed Care – PPO | Attending: Cardiology | Admitting: Cardiology

## 2012-07-07 DIAGNOSIS — Z951 Presence of aortocoronary bypass graft: Secondary | ICD-10-CM

## 2012-07-07 DIAGNOSIS — L408 Other psoriasis: Secondary | ICD-10-CM | POA: Diagnosis present

## 2012-07-07 DIAGNOSIS — I2510 Atherosclerotic heart disease of native coronary artery without angina pectoris: Secondary | ICD-10-CM | POA: Diagnosis present

## 2012-07-07 DIAGNOSIS — M519 Unspecified thoracic, thoracolumbar and lumbosacral intervertebral disc disorder: Secondary | ICD-10-CM | POA: Diagnosis present

## 2012-07-07 DIAGNOSIS — I10 Essential (primary) hypertension: Secondary | ICD-10-CM

## 2012-07-07 DIAGNOSIS — I2119 ST elevation (STEMI) myocardial infarction involving other coronary artery of inferior wall: Secondary | ICD-10-CM

## 2012-07-07 DIAGNOSIS — E039 Hypothyroidism, unspecified: Secondary | ICD-10-CM | POA: Diagnosis present

## 2012-07-07 DIAGNOSIS — E785 Hyperlipidemia, unspecified: Secondary | ICD-10-CM | POA: Diagnosis present

## 2012-07-07 DIAGNOSIS — Z87891 Personal history of nicotine dependence: Secondary | ICD-10-CM

## 2012-07-07 DIAGNOSIS — I4891 Unspecified atrial fibrillation: Secondary | ICD-10-CM | POA: Diagnosis present

## 2012-07-07 DIAGNOSIS — Z79899 Other long term (current) drug therapy: Secondary | ICD-10-CM

## 2012-07-07 HISTORY — PX: PERCUTANEOUS CORONARY STENT INTERVENTION (PCI-S): SHX5485

## 2012-07-07 HISTORY — PX: LEFT HEART CATH: SHX5478

## 2012-07-07 LAB — CBC WITH DIFFERENTIAL/PLATELET
Eosinophils Absolute: 0.2 10*3/uL (ref 0.0–0.7)
HCT: 41.7 % (ref 39.0–52.0)
Hemoglobin: 14.7 g/dL (ref 13.0–17.0)
Lymphs Abs: 3.3 10*3/uL (ref 0.7–4.0)
MCH: 31.3 pg (ref 26.0–34.0)
MCHC: 35.3 g/dL (ref 30.0–36.0)
MCV: 88.9 fL (ref 78.0–100.0)
Monocytes Absolute: 0.9 10*3/uL (ref 0.1–1.0)
Monocytes Relative: 8 % (ref 3–12)
Neutrophils Relative %: 59 % (ref 43–77)
RBC: 4.69 MIL/uL (ref 4.22–5.81)

## 2012-07-07 LAB — CK TOTAL AND CKMB (NOT AT ARMC)
CK, MB: 25.8 ng/mL (ref 0.3–4.0)
Total CK: 535 U/L — ABNORMAL HIGH (ref 7–232)

## 2012-07-07 LAB — COMPREHENSIVE METABOLIC PANEL
AST: 45 U/L — ABNORMAL HIGH (ref 0–37)
Albumin: 3.8 g/dL (ref 3.5–5.2)
Alkaline Phosphatase: 52 U/L (ref 39–117)
Alkaline Phosphatase: 49 U/L (ref 39–117)
BUN: 26 mg/dL — ABNORMAL HIGH (ref 6–23)
BUN: 25 mg/dL — ABNORMAL HIGH (ref 6–23)
Creatinine, Ser: 1.26 mg/dL (ref 0.50–1.35)
GFR calc Af Amer: 67 mL/min — ABNORMAL LOW (ref >90)
Glucose, Bld: 131 mg/dL — ABNORMAL HIGH (ref 70–99)
Potassium: 4.1 mEq/L (ref 3.5–5.1)
Potassium: 5.5 mEq/L — ABNORMAL HIGH (ref 3.5–5.1)
Total Bilirubin: 0.8 mg/dL (ref 0.3–1.2)
Total Protein: 7.1 g/dL (ref 6.0–8.3)
Total Protein: 6.6 g/dL (ref 6.0–8.3)

## 2012-07-07 LAB — HEMOGLOBIN A1C
Hgb A1c MFr Bld: 5.1 % (ref <5.7)
Mean Plasma Glucose: 100 mg/dL (ref <117)

## 2012-07-07 LAB — POCT I-STAT TROPONIN I: Troponin i, poc: 0.01 ng/mL (ref 0.00–0.08)

## 2012-07-07 LAB — CBC
MCH: 30.9 pg (ref 26.0–34.0)
MCV: 89 fL (ref 78.0–100.0)
Platelets: 143 10*3/uL — ABNORMAL LOW (ref 150–400)
RDW: 12.6 % (ref 11.5–15.5)
WBC: 9.1 10*3/uL (ref 4.0–10.5)

## 2012-07-07 LAB — POCT I-STAT, CHEM 8
Calcium, Ion: 1.15 mmol/L (ref 1.13–1.30)
Chloride: 106 mEq/L (ref 96–112)
HCT: 44 % (ref 39.0–52.0)
Sodium: 139 mEq/L (ref 135–145)
TCO2: 25 mmol/L (ref 0–100)

## 2012-07-07 LAB — PROTIME-INR
INR: 1.95 — ABNORMAL HIGH (ref 0.00–1.49)
Prothrombin Time: 21.5 seconds — ABNORMAL HIGH (ref 11.6–15.2)

## 2012-07-07 LAB — APTT: aPTT: >200 seconds (ref 24–37)

## 2012-07-07 LAB — TROPONIN I
Troponin I: <0.3 ng/mL (ref <0.30)
Troponin I: >20 ng/mL (ref <0.30)

## 2012-07-07 LAB — TSH: TSH: 2.853 u[IU]/mL (ref 0.350–4.500)

## 2012-07-07 SURGERY — LEFT HEART CATH
Anesthesia: LOCAL

## 2012-07-07 MED ORDER — PRASUGREL HCL 10 MG PO TABS
ORAL_TABLET | ORAL | Status: AC
  Filled 2012-07-07: qty 6

## 2012-07-07 MED ORDER — HEPARIN (PORCINE) IN NACL 2-0.9 UNIT/ML-% IJ SOLN
INTRAMUSCULAR | Status: AC
  Filled 2012-07-07: qty 1500

## 2012-07-07 MED ORDER — ACETAMINOPHEN 325 MG PO TABS: 650.0000 mg | ORAL_TABLET | ORAL | Status: DC | PRN

## 2012-07-07 MED ORDER — ASPIRIN 300 MG RE SUPP
300.0000 mg | RECTAL | Status: DC
  Filled 2012-07-07: qty 1

## 2012-07-07 MED ORDER — MIDAZOLAM HCL 2 MG/2ML IJ SOLN
INTRAMUSCULAR | Status: AC
  Filled 2012-07-07: qty 2

## 2012-07-07 MED ORDER — SODIUM CHLORIDE 0.9 % IV SOLN: INTRAVENOUS | Status: AC

## 2012-07-07 MED ORDER — ASPIRIN EC 81 MG PO TBEC
81.0000 mg | DELAYED_RELEASE_TABLET | Freq: Every day | ORAL | Status: DC
  Administered 2012-07-08 – 2012-07-09 (×2): 81 mg via ORAL
  Filled 2012-07-07 (×2): qty 1

## 2012-07-07 MED ORDER — HEPARIN SODIUM (PORCINE) 5000 UNIT/ML IJ SOLN
4000.0000 [IU] | Freq: Once | INTRAMUSCULAR | Status: AC
  Administered 2012-07-07: 4000 [IU] via INTRAVENOUS
  Filled 2012-07-07: qty 1

## 2012-07-07 MED ORDER — HEPARIN SODIUM (PORCINE) 5000 UNIT/ML IJ SOLN
5000.0000 [IU] | Freq: Three times a day (TID) | INTRAMUSCULAR | Status: DC
  Administered 2012-07-08 – 2012-07-09 (×4): 5000 [IU] via SUBCUTANEOUS
  Filled 2012-07-07 (×7): qty 1

## 2012-07-07 MED ORDER — SODIUM CHLORIDE 0.9 % IV SOLN
0.2500 mg/kg/h | INTRAVENOUS | Status: AC
  Filled 2012-07-07: qty 250

## 2012-07-07 MED ORDER — ATROPINE SULFATE 1 MG/ML IJ SOLN
INTRAMUSCULAR | Status: AC
  Filled 2012-07-07: qty 1

## 2012-07-07 MED ORDER — NITROGLYCERIN 1 MG/10 ML FOR IR/CATH LAB
INTRA_ARTERIAL | Status: AC
  Filled 2012-07-07: qty 10

## 2012-07-07 MED ORDER — ONDANSETRON HCL 4 MG/2ML IJ SOLN: 4.0000 mg | Freq: Four times a day (QID) | INTRAMUSCULAR | Status: DC | PRN

## 2012-07-07 MED ORDER — NITROGLYCERIN 0.4 MG SL SUBL: 0.4000 mg | SUBLINGUAL_TABLET | SUBLINGUAL | Status: DC | PRN

## 2012-07-07 MED ORDER — BIVALIRUDIN 250 MG IV SOLR
INTRAVENOUS | Status: AC
  Filled 2012-07-07: qty 250

## 2012-07-07 MED ORDER — ASPIRIN 81 MG PO CHEW: 324.0000 mg | CHEWABLE_TABLET | ORAL | Status: DC

## 2012-07-07 MED ORDER — FENTANYL CITRATE 0.05 MG/ML IJ SOLN
INTRAMUSCULAR | Status: AC
  Filled 2012-07-07: qty 2

## 2012-07-07 MED ORDER — LIDOCAINE HCL (PF) 1 % IJ SOLN
INTRAMUSCULAR | Status: AC
  Filled 2012-07-07: qty 30

## 2012-07-07 MED ORDER — ATORVASTATIN CALCIUM 80 MG PO TABS
80.0000 mg | ORAL_TABLET | Freq: Every day | ORAL | Status: DC
  Administered 2012-07-07: 80 mg via ORAL
  Filled 2012-07-07 (×3): qty 1

## 2012-07-07 MED ORDER — CLOPIDOGREL BISULFATE 75 MG PO TABS
75.0000 mg | ORAL_TABLET | Freq: Every day | ORAL | Status: DC
  Administered 2012-07-08 – 2012-07-09 (×2): 75 mg via ORAL
  Filled 2012-07-07 (×3): qty 1

## 2012-07-07 MED ORDER — LEVOTHYROXINE SODIUM 50 MCG PO TABS
50.0000 ug | ORAL_TABLET | Freq: Every day | ORAL | Status: DC
  Administered 2012-07-08 – 2012-07-09 (×2): 50 ug via ORAL
  Filled 2012-07-07 (×3): qty 1

## 2012-07-07 MED ORDER — METOPROLOL TARTRATE 25 MG PO TABS
25.0000 mg | ORAL_TABLET | Freq: Two times a day (BID) | ORAL | Status: DC
  Administered 2012-07-07 – 2012-07-09 (×4): 25 mg via ORAL
  Filled 2012-07-07 (×5): qty 1

## 2012-07-07 MED ORDER — NITROGLYCERIN 0.4 MG SL SUBL
0.4000 mg | SUBLINGUAL_TABLET | SUBLINGUAL | Status: DC | PRN
  Administered 2012-07-07: 0.4 mg via SUBLINGUAL
  Filled 2012-07-07: qty 25

## 2012-07-07 NOTE — Progress Notes (Signed)
Critical lab values: CKMB and troponin called from lab; attempted to contact [facility] NP Lyman Bishop X 2 without success; night RN aware

## 2012-07-07 NOTE — Progress Notes (Signed)
Sheath pulled with tip intact from right femoral artery per protocol; manual pressure held for 20 minutes; site soft with no active bleeding or hematoma; palpable right femoral, right DP/PT pulses; pt tolerated well with no complaints; VS per flowsheet; gauze and tape pressure dressing applied; pt education provided regarding bedrest and activity restrictions following sheath removal; will continue to closely monitor

## 2012-07-07 NOTE — H&P (Signed)
[facility]  Admit date: 07/07/2012 Primary Physician  Dr. Oliver Barre Primary Cardiologist  : Dr. Antoine Poche  CC: ACUTE inferior STEMI  HPI: 67 year old male with ordinary artery disease status post bypass in December of 2012 by Dr. Laneta Simmers, LIMA to LAD, SVG to ramus intermedius, SVG to obtuse marginal with prior ejection fraction of 55-65% in the setting of prior non-ST elevation myocardial infarction who was in his usual state of health until earlier this morning was doing yard work and began to feel moderate chest pressure/pain central chest with no radiation, different from his prior heart attack discomfort. Associated diaphoresis. He sat down originally, and his pain began to subside. He had taking a load of brush to the junkyard , came back and started clearing brush once again in his backyard when he began experiencing central chest discomfort with associated diaphoresis once again. He states that he knew something was not right. He called 911.  The discomfort lasted several minutes in duration and subsided shortly after stent placement. EMS was called and initial EKG demonstrated ST elevation in the inferior leads with reciprocal ST segment depression in the early precordial leads. Code STEMI was called. Dr. Peter Swaziland took him to cardiac catheterization lab.  At his most recent visit on 09/30/11, he denied any symptoms of chest discomfort, neck, arm. He has had no shortness of breath. No reported palpitations. He did have brief postoperative atrial fibrillation, amiodarone was used temporarily. His blood pressure was well-controlled. Lipitor is being utilized for cost (was on Crestor).    PMH:   Past Medical History  Diagnosis Date  . Coronary artery disease     s/p NSTEMI 12/12;  LHC 01/13/11: oLAD 50% and prox 20%, oRI 40% and prox 40%, pCFX 40%, pOM1 95%, EF 55-65%.  Intervention of the obtuse marginal was felt to be too complex   . S/P CABG (coronary artery bypass graft)      12/12 with Dr. Laneta Simmers: LIMA-LAD, SVG-RI, SVG-OM  . Atrial fibrillation     post op after CABG; tx with amiodarone  . HTN (hypertension)   . HLD (hyperlipidemia)   . Sciatica 03/19/2011  . Lumbar disc disease 03/19/2011  . Psoriasis 03/22/2011    PSH:   Past Surgical History  Procedure Laterality Date  . Tonsillectomy    . Coronary artery bypass graft  01/19/2011    Procedure: CORONARY ARTERY BYPASS GRAFTING (CABG);  Surgeon: Alleen Borne, MD;  Location: [facility] OR;  Service: Open Heart Surgery;  Laterality: N/A;   Allergies:  Dilaudid Prior to Admit Meds:   Prescriptions prior to admission  Medication Sig Dispense Refill  . atorvastatin (LIPITOR) 40 MG tablet TAKE 1 TABLET BY MOUTH DAILY  30 tablet  0  . levothyroxine (SYNTHROID, LEVOTHROID) 50 MCG tablet TAKE 1 TABLET BY MOUTH EVERY DAY  90 tablet  0  . lisinopril (PRINIVIL,ZESTRIL) 10 MG tablet TAKE 1 TABLET BY MOUTH DAILY  30 tablet  3  . metoprolol tartrate (LOPRESSOR) 25 MG tablet TAKE 1 TABLET BY MOUTH TWICE DAILY  60 tablet  10   Fam HX:    Family History  Problem Relation Age of Onset  . Cancer Mother     colon  . Heart disease Mother   . Hyperlipidemia Father   . Heart disease Father    Social HX:    History   Social History  . Marital Status: Married    Spouse Name: N/A    Number of Children: N/A  . Years  of Education: N/A   Occupational History  . Not on file.   Social History Main Topics  . Smoking status: Former Smoker    Quit date: 05/12/1968  . Smokeless tobacco: Never Used  . Alcohol Use: Yes     Comment: occasional  . Drug Use: No  . Sexually Active: Yes   Other Topics Concern  . Not on file   Social History Narrative  . 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 Nonsmoker, no alcohol  ROS: Denies any recent fevers, headaches, strokelike symptoms, bleeding, melena, orthopnea, PND. All 11 ROS were addressed and are negative except what is stated in the HPI  Physical  Exam: Blood pressure 105/68, pulse 54, temperature 97.5 F (36.4 C), temperature source Oral, resp. rate 11, SpO2 100.00%.    General: Well developed, well nourished, in no acute distress Head: Eyes PERRLA, No xanthomas.   Normal cephalic and atramatic  Lungs:   Clear bilaterally to auscultation and percussion. Normal respiratory effort. No wheezes, no rales. Heart:   HRRR S1 S2 Pulses are 2+ & equal. Chest wall scar noted. No carotid bruit. No JVD.  No abdominal bruits.  Abdomen: Bowel sounds are positive, abdomen soft and non-tender without masses noted. No hepatosplenomegaly. Msk:  Back normal, normal gait. Normal strength and tone for age. Extremities:   No clubbing, cyanosis or edema.  DP +1 Neuro: Alert and oriented X 3, non-focal, MAE x 4 GU: Deferred Rectal: Deferred Psych:  Good affect, responds appropriately    Labs:   Lab Results  Component Value Date   WBC 10.6* 07/07/2012   HGB 15.0 07/07/2012   HCT 44.0 07/07/2012   MCV 88.9 07/07/2012   PLT 169 07/07/2012    Recent Labs Lab 07/07/12 1356  NA 139  K 4.1  CL 106  BUN 27*  CREATININE 1.30  GLUCOSE 124*   No results found for this basename: PTT   Lab Results  Component Value Date   INR 1.31 01/19/2011   INR 1.04 01/19/2011   INR 1.00 01/13/2011   Lab Results  Component Value Date   CKTOTAL 110 01/14/2011   CKMB 6.2* 01/14/2011   TROPONINI 1.17* 01/14/2011     Lab Results  Component Value Date   CHOL 114 01/31/2012   CHOL 115 03/24/2011   CHOL 191 01/14/2011   Lab Results  Component Value Date   HDL 35.80* 01/31/2012   HDL 37.60* 03/24/2011   HDL 35* 01/14/2011   Lab Results  Component Value Date   LDLCALC 62 01/31/2012   LDLCALC 56 03/24/2011   LDLCALC 132* 01/14/2011   Lab Results  Component Value Date   TRIG 82.0 01/31/2012   TRIG 108.0 03/24/2011   TRIG 120 01/14/2011   Lab Results  Component Value Date   CHOLHDL 3 01/31/2012   CHOLHDL 3 03/24/2011   CHOLHDL 5.5 01/14/2011   No results  found for this basename: LDLDIRECT      Radiology:  No results found.   EKG:  As described above in history of present illness, inferior ST elevation with reciprocal ST segment depression in  precordial leads. Prior EKG from 09/30/11 shows no Q waves, no ST segment depression or elevation, sinus bradycardia rate 58 beats per minute. Personally viewed.  Echocardiogram from 01/14/11 shows normal ejection fraction of 60% with mild LVH.   ASSESSMENT/PLAN:   67 year old male with coronary artery disease status post bypass 12/12 here with acute inferior ST elevation myocardial infarction.  1. Acute ST elevation inferior myocardial infarction  - Emergency cardiac catheterization, he is currently in lab with Dr. Swaziland.  - Native obtuse marginal/circ was occluded. 3.5 x 24mm Promus DES was placed. Pain free post PCI.    - Beta blocker, aspirin, statin, dual antiplatelet therapy, nitrates as necessary.  2. Coronary artery disease-status post bypass with anatomy as above. Non dominant right. Bypass anatomy widely patent.  3. Hyperlipidemia-continue with atorvastatin high dose preferable.  4. Hypothyroidism-continue Synthroid.  CCU.  Donato Schultz, MD  07/07/2012  2:11 PM

## 2012-07-07 NOTE — ED Provider Notes (Signed)
History     CSN: [id number]  Arrival date & time 07/07/12  1339   First MD Initiated Contact with Patient 07/07/12 1340      Chief Complaint  Patient presents with  . Code STEMI    (Consider location/radiation/quality/duration/timing/severity/associated sxs/prior treatment) HPI 34 you m with hx of CABG in 2012 followed by Dr Antoine Poche p/w chest pressure while clearing brush in yard roughly 1 hour prior to presentation. Given 325 mg of ASA en route by EMS. Pt states pressure in 4/10. Denies pain, SOB, N/V.  Past Medical History  Diagnosis Date  . Coronary artery disease     s/p NSTEMI 12/12;  LHC 01/13/11: oLAD 50% and prox 20%, oRI 40% and prox 40%, pCFX 40%, pOM1 95%, EF 55-65%.  Intervention of the obtuse marginal was felt to be too complex   . S/P CABG (coronary artery bypass graft)     12/12 with Dr. Laneta Simmers: LIMA-LAD, SVG-RI, SVG-OM  . Atrial fibrillation     post op after CABG; tx with amiodarone  . HTN (hypertension)   . HLD (hyperlipidemia)   . Sciatica 03/19/2011  . Lumbar disc disease 03/19/2011  . Psoriasis 03/22/2011    Past Surgical History  Procedure Laterality Date  . Tonsillectomy    . Coronary artery bypass graft  01/19/2011    Procedure: CORONARY ARTERY BYPASS GRAFTING (CABG);  Surgeon: Alleen Borne, MD;  Location: [facility] OR;  Service: Open Heart Surgery;  Laterality: N/A;    Family History  Problem Relation Age of Onset  . Cancer Mother     colon  . Heart disease Mother   . Hyperlipidemia Father   . Heart disease Father     History  Substance Use Topics  . Smoking status: Former Smoker    Quit date: 05/12/1968  . Smokeless tobacco: Never Used  . Alcohol Use: Yes     Comment: occasional      Review of Systems  Respiratory: Positive for chest tightness. Negative for shortness of breath.   Cardiovascular: Negative for chest pain, palpitations and leg swelling.  Gastrointestinal: Negative for nausea, vomiting and abdominal pain.  Musculoskeletal:  Negative for back pain.  Skin: Negative for rash and wound.  Neurological: Negative for dizziness, weakness, light-headedness, numbness and headaches.  All other systems reviewed and are negative.    Allergies  Dilaudid  Home Medications   Current Outpatient Rx  Name  Route  Sig  Dispense  Refill  . atorvastatin (LIPITOR) 40 MG tablet      TAKE 1 TABLET BY MOUTH DAILY   30 tablet   0   . levothyroxine (SYNTHROID, LEVOTHROID) 50 MCG tablet      TAKE 1 TABLET BY MOUTH EVERY DAY   90 tablet   0     The patient needs to schedule an office visit for  ...   . lisinopril (PRINIVIL,ZESTRIL) 10 MG tablet      TAKE 1 TABLET BY MOUTH DAILY   30 tablet   3   . metoprolol tartrate (LOPRESSOR) 25 MG tablet      TAKE 1 TABLET BY MOUTH TWICE DAILY   60 tablet   10     BP 114/52  Pulse 54  Resp 15  SpO2 99%  Physical Exam  Nursing note and vitals reviewed. Constitutional: He is oriented to person, place, and time. He appears well-developed and well-nourished. No distress.  HENT:  Head: Normocephalic and atraumatic.  Mouth/Throat: Oropharynx is clear and moist.  Eyes: EOM  are normal. Pupils are equal, round, and reactive to light.  Neck: Normal range of motion. Neck supple.  Cardiovascular: Normal rate and regular rhythm.   Pulmonary/Chest: Effort normal and breath sounds normal. No respiratory distress. He has no wheezes. He has no rales. He exhibits no tenderness.  Abdominal: Soft. Bowel sounds are normal. He exhibits no distension and no mass. There is no tenderness. There is no rebound and no guarding.  Musculoskeletal: Normal range of motion. He exhibits no edema and no tenderness.  No calf swelling or tenderness  Neurological: He is alert and oriented to person, place, and time.  Moves all ext without deficit.   Skin: Skin is warm and dry. No rash noted. No erythema.  Psychiatric: He has a normal mood and affect. His behavior is normal.    ED Course  Procedures  (including critical care time)  Labs Reviewed  CBC WITH DIFFERENTIAL  COMPREHENSIVE METABOLIC PANEL  TROPONIN I   No results found.   No diagnosis found.   Date: 07/07/2012  Rate: 52  Rhythm: sinus bradycardia  QRS Axis: normal  Intervals: normal  ST/T Wave abnormalities: ST elevations inferiorly and ST depressions anteriorly  Conduction Disutrbances:none  Narrative Interpretation:   Old EKG Reviewed: changes noted    MDM  Discussed with Dr Swaziland who advises continuing with heparin and will take to cath lab when rest of cath team arrives.         Loren Racer, MD 07/08/12 [phone]

## 2012-07-07 NOTE — ED Notes (Signed)
Old Zoll pads placed on pt.

## 2012-07-07 NOTE — ED Notes (Signed)
Pt reports to the ED for code STEMI. Pt was clearing brush in his backyard when he began experiencing central chest pressure that he rates as a 4/10. Associated symptoms diaphoresis. Pt denies any dizziness, N/V, SOB, lightheadedness, ect. 12 lead en route showed inferior infarct with ST elevation. Pt has hx of CABG in 2012. Pt had 324 of ASA PTA. Pt A&O x4. EDP at bedside upon arrival.

## 2012-07-07 NOTE — CV Procedure (Signed)
  Cardiac Catheterization Procedure Note  Name: Jonathon [name] MRN: [id number] DOB: 1945/07/20  Procedure: Left Heart Cath, Selective Coronary Angiography, SVG angiography, LIMA angiography, LV angiography,  PTCA/Stent of the native LCX.  Indication: 67 year-old white male with history of coronary disease status post coronary bypass surgery in December 2012. He presents with an inferior STEMI with 1-2 mm of ST elevation inferiorly and reciprocal ST depression in leads 1 and aVL.   Diagnostic Procedure Details: The right groin was prepped, draped, and anesthetized with 1% lidocaine. Using the modified Seldinger technique, a 6 French sheath was introduced into the right femoral artery. Standard Judkins catheters were used for selective coronary angiography and left ventriculography. Catheter exchanges were performed over a wire.  The diagnostic procedure was well-tolerated without immediate complications.  PROCEDURAL FINDINGS Hemodynamics: AO 91/52 with a mean of 69 mmHg LV 95/14 mmHg  Coronary angiography: Coronary dominance: Left  Left mainstem: There is eccentric 30-40% stenosis at the origin of the left main.  Left anterior descending (LAD): There is diffuse disease in the proximal LAD to 90%. After the first septal perforator the LAD fills competitively by the IMA graft.  Left circumflex (LCx): The left circumflex is a dominant vessel. It is occluded proximally.  Right coronary artery (RCA): The right coronary is a small nondominant vessel that bifurcates immediately at the origin. The more inferior branch has a 50% stenosis at the origin. The more superior branch has scattered disease up to 30%.  The saphenous vein graft to the first obtuse marginal vessel is widely patent with good runoff.  The saphenous vein graft to the ramus intermediate branch is widely patent with good runoff.  The LIMA graft to the LAD is widely patent with good runoff.  Left ventriculography: Left  ventricular systolic function is normal, LVEF is estimated at 55-60%, there is very mild inferior basal hypokinesis, there is no significant mitral regurgitation   PCI Procedure Note:  Following the diagnostic procedure, the decision was made to proceed with PCI of the native circumflex. Effient 60 mg was given orally. Weight-based bivalirudin was given for anticoagulation. Once a therapeutic ACT was achieved, a 6 [place] XB LAD 4 guide catheter was inserted.  A pro-water coronary guidewire was used to cross the lesion.  The lesion was predilated with a 2.5 mm balloon.  The lesion was then stented with a 3.5 x 24 mm Promus premier stent.  The stent was postdilated with a 4.0 mm noncompliant balloon.  Following PCI, there was 0% residual stenosis and TIMI-3 flow. Final angiography confirmed an excellent result. Femoral hemostasis was achieved with manual compression.  The patient tolerated the PCI procedure well. There were no immediate procedural complications.  The patient was transferred to the post catheterization recovery area for further monitoring.  PCI Data: Vessel - native left circumflex/Segment - proximal Percent Stenosis (pre)  100% TIMI-flow 0 Stent 3.5 x 24 mm Promus premier Percent Stenosis (post) 0% TIMI-flow (post) 3  Final Conclusions:   1. Left dominant circulation. 2. Severe two-vessel obstructive coronary disease. New culprit occlusion of the proximal left circumflex. 3. Patent saphenous vein graft to the first OM. 4. Patent saphenous vein graft to the intermediate.  5. Patent LIMA graft to the LAD. 6. Good LV function. 7. Successful stenting of the proximal left circumflex with a DES.  Recommendations: Continue dual antiplatelet therapy for one year. Patient is a candidate for fast track discharge.  Theron Ar[facility] 07/07/2012, 2:49 PM

## 2012-07-08 DIAGNOSIS — I2119 ST elevation (STEMI) myocardial infarction involving other coronary artery of inferior wall: Principal | ICD-10-CM

## 2012-07-08 LAB — CBC
MCH: 31.1 pg (ref 26.0–34.0)
MCHC: 35.1 g/dL (ref 30.0–36.0)
Platelets: 137 10*3/uL — ABNORMAL LOW (ref 150–400)

## 2012-07-08 LAB — BASIC METABOLIC PANEL
Calcium: 8.8 mg/dL (ref 8.4–10.5)
GFR calc Af Amer: >90 mL/min (ref >90)
GFR calc non Af Amer: 86 mL/min — ABNORMAL LOW (ref >90)
Sodium: 138 mEq/L (ref 135–145)

## 2012-07-08 LAB — LIPID PANEL: Cholesterol: 126 mg/dL (ref 0–200)

## 2012-07-08 LAB — TROPONIN I: Troponin I: >20 ng/mL (ref <0.30)

## 2012-07-08 NOTE — Progress Notes (Signed)
Chaplain responded to code stemi page to the ED trauma A. Pt was sent to Cath lab after brief assessment by the doctors. Chaplain served as Print production planner between the Programme researcher, broadcasting/film/video and family. Chaplain provided ministry of presence until pt was admitted to [id number]. Family thanked chaplain for updating them on pt's procedure and his presence. Kelle Darting [phone]  07/07/12 1615  Clinical Encounter Type  Visited With Patient and family together  Visit Type Initial;Spiritual support;Code;Critical Care;ED;Trauma  Referral From Nurse  Spiritual Encounters  Spiritual Needs Emotional;Other (Comment) (Liaison)  Stress Factors  Patient Stress Factors Health changes  Family Stress Factors Major life changes

## 2012-07-08 NOTE — Progress Notes (Signed)
SUBJECTIVE:  No chest pain.  No SOB   PHYSICAL EXAM Filed Vitals:   07/08/12 0000 07/08/12 0400 07/08/12 0500 07/08/12 0830  BP: 105/65 89/55  91/54  Pulse: 57 55    Temp: 98.2 F (36.8 C)   98.3 F (36.8 C)  TempSrc: Oral   Oral  Resp:    16  Height:      Weight:   180 lb 12.4 oz (82 kg)   SpO2: 100% 100%  98%   General:  No distress Lungs:  Clear Heart:  RRR, no rub Abdomen:  Positive bowel sounds, no rebound no guarding Extremities:  Right groin without hematoma or echymosis.    LABS: Lab Results  Component Value Date   CKTOTAL 535* 07/07/2012   CKMB 25.8* 07/07/2012   TROPONINI >20.00* 07/08/2012   Results for orders placed during the hospital encounter of 07/07/12 (from the past 24 hour(s))  CBC WITH DIFFERENTIAL     Status: Abnormal   Collection Time    07/07/12  1:41 PM      Result Value Range   WBC 10.6 (*) 4.0 - 10.5 K/uL   RBC 4.69  4.22 - 5.81 MIL/uL   Hemoglobin 14.7  13.0 - 17.0 g/dL   HCT 96.0  45.4 - 09.8 %   MCV 88.9  78.0 - 100.0 fL   MCH 31.3  26.0 - 34.0 pg   MCHC 35.3  30.0 - 36.0 g/dL   RDW 11.9  14.7 - 82.9 %   Platelets 169  150 - 400 K/uL   Neutrophils Relative % 59  43 - 77 %   Neutro Abs 6.3  1.7 - 7.7 K/uL   Lymphocytes Relative 31  12 - 46 %   Lymphs Abs 3.3  0.7 - 4.0 K/uL   Monocytes Relative 8  3 - 12 %   Monocytes Absolute 0.9  0.1 - 1.0 K/uL   Eosinophils Relative 2  0 - 5 %   Eosinophils Absolute 0.2  0.0 - 0.7 K/uL   Basophils Relative 0  0 - 1 %   Basophils Absolute 0.0  0.0 - 0.1 K/uL  COMPREHENSIVE METABOLIC PANEL     Status: Abnormal   Collection Time    07/07/12  1:41 PM      Result Value Range   Sodium 137  135 - 145 mEq/L   Potassium 4.1  3.5 - 5.1 mEq/L   Chloride 101  96 - 112 mEq/L   CO2 24  19 - 32 mEq/L   Glucose, Bld 131 (*) 70 - 99 mg/dL   BUN 26 (*) 6 - 23 mg/dL   Creatinine, Ser 5.62  0.50 - 1.35 mg/dL   Calcium 9.7  8.4 - 13.0 mg/dL   Total Protein 7.1  6.0 - 8.3 g/dL   Albumin 4.1  3.5 - 5.2 g/dL     AST 30  0 - 37 U/L   ALT 25  0 - 53 U/L   Alkaline Phosphatase 52  39 - 117 U/L   Total Bilirubin 0.8  0.3 - 1.2 mg/dL   GFR calc non Af Amer 58 (*) >90 mL/min   GFR calc Af Amer 67 (*) >90 mL/min  TROPONIN I     Status: None   Collection Time    07/07/12  1:41 PM      Result Value Range   Troponin I <0.30  <0.30 ng/mL  POCT I-STAT TROPONIN I     Status: None  Collection Time    07/07/12  1:54 PM      Result Value Range   Troponin i, poc 0.01  0.00 - 0.08 ng/mL   Comment 3           POCT I-STAT, CHEM 8     Status: Abnormal   Collection Time    07/07/12  1:56 PM      Result Value Range   Sodium 139  135 - 145 mEq/L   Potassium 4.1  3.5 - 5.1 mEq/L   Chloride 106  96 - 112 mEq/L   BUN 27 (*) 6 - 23 mg/dL   Creatinine, Ser 1.61  0.50 - 1.35 mg/dL   Glucose, Bld 096 (*) 70 - 99 mg/dL   Calcium, Ion 0.45  4.09 - 1.30 mmol/L   TCO2 25  0 - 100 mmol/L   Hemoglobin 15.0  13.0 - 17.0 g/dL   HCT 81.1  91.4 - 78.2 %  CK TOTAL AND CKMB     Status: Abnormal   Collection Time    07/07/12  4:04 PM      Result Value Range   Total CK 535 (*) 7 - 232 U/L   CK, MB 25.8 (*) 0.3 - 4.0 ng/mL   Relative Index 4.8 (*) 0.0 - 2.5  PROTIME-INR     Status: Abnormal   Collection Time    07/07/12  4:04 PM      Result Value Range   Prothrombin Time 21.5 (*) 11.6 - 15.2 seconds   INR 1.95 (*) 0.00 - 1.49  APTT     Status: Abnormal   Collection Time    07/07/12  4:04 PM      Result Value Range   aPTT >200 (*) 24 - 37 seconds  COMPREHENSIVE METABOLIC PANEL     Status: Abnormal   Collection Time    07/07/12  4:04 PM      Result Value Range   Sodium 136  135 - 145 mEq/L   Potassium 5.5 (*) 3.5 - 5.1 mEq/L   Chloride 102  96 - 112 mEq/L   CO2 24  19 - 32 mEq/L   Glucose, Bld 110 (*) 70 - 99 mg/dL   BUN 25 (*) 6 - 23 mg/dL   Creatinine, Ser 9.56  0.50 - 1.35 mg/dL   Calcium 9.2  8.4 - 21.3 mg/dL   Total Protein 6.6  6.0 - 8.3 g/dL   Albumin 3.8  3.5 - 5.2 g/dL   AST 45 (*) 0 - 37 U/L    ALT 25  0 - 53 U/L   Alkaline Phosphatase 49  39 - 117 U/L   Total Bilirubin 0.8  0.3 - 1.2 mg/dL   GFR calc non Af Amer 67 (*) >90 mL/min   GFR calc Af Amer 78 (*) >90 mL/min  HEMOGLOBIN A1C     Status: None   Collection Time    07/07/12  4:04 PM      Result Value Range   Hemoglobin A1C 5.1  <5.7 %   Mean Plasma Glucose 100  <117 mg/dL  TROPONIN I     Status: Abnormal   Collection Time    07/07/12  4:04 PM      Result Value Range   Troponin I 3.65 (*) <0.30 ng/mL  TSH     Status: None   Collection Time    07/07/12  4:04 PM      Result Value Range   TSH 2.853  0.350 -  4.500 uIU/mL  PRO B NATRIURETIC PEPTIDE     Status: Abnormal   Collection Time    07/07/12  4:04 PM      Result Value Range   Pro B Natriuretic peptide (BNP) 189.7 (*) 0 - 125 pg/mL  CBC     Status: Abnormal   Collection Time    07/07/12  4:04 PM      Result Value Range   WBC 9.1  4.0 - 10.5 K/uL   RBC 4.46  4.22 - 5.81 MIL/uL   Hemoglobin 13.8  13.0 - 17.0 g/dL   HCT 09.8  11.9 - 14.7 %   MCV 89.0  78.0 - 100.0 fL   MCH 30.9  26.0 - 34.0 pg   MCHC 34.8  30.0 - 36.0 g/dL   RDW 82.9  56.2 - 13.0 %   Platelets 143 (*) 150 - 400 K/uL  MRSA PCR SCREENING     Status: None   Collection Time    07/07/12  4:40 PM      Result Value Range   MRSA by PCR NEGATIVE  NEGATIVE  TROPONIN I     Status: Abnormal   Collection Time    07/07/12  9:51 PM      Result Value Range   Troponin I >20.00 (*) <0.30 ng/mL  CBC     Status: Abnormal   Collection Time    07/08/12  2:42 AM      Result Value Range   WBC 7.3  4.0 - 10.5 K/uL   RBC 4.38  4.22 - 5.81 MIL/uL   Hemoglobin 13.6  13.0 - 17.0 g/dL   HCT 86.5 (*) 78.4 - 69.6 %   MCV 88.6  78.0 - 100.0 fL   MCH 31.1  26.0 - 34.0 pg   MCHC 35.1  30.0 - 36.0 g/dL   RDW 29.5  28.4 - 13.2 %   Platelets 137 (*) 150 - 400 K/uL  BASIC METABOLIC PANEL     Status: Abnormal   Collection Time    07/08/12  2:42 AM      Result Value Range   Sodium 138  135 - 145 mEq/L    Potassium 4.0  3.5 - 5.1 mEq/L   Chloride 105  96 - 112 mEq/L   CO2 25  19 - 32 mEq/L   Glucose, Bld 98  70 - 99 mg/dL   BUN 18  6 - 23 mg/dL   Creatinine, Ser 4.40  0.50 - 1.35 mg/dL   Calcium 8.8  8.4 - 10.2 mg/dL   GFR calc non Af Amer 86 (*) >90 mL/min   GFR calc Af Amer >90  >90 mL/min  LIPID PANEL     Status: None   Collection Time    07/08/12  2:42 AM      Result Value Range   Cholesterol 126  0 - 200 mg/dL   Triglycerides 66  <725 mg/dL   HDL 41  >36 mg/dL   Total CHOL/HDL Ratio 3.1     VLDL 13  0 - 40 mg/dL   LDL Cholesterol 72  0 - 99 mg/dL  TROPONIN I     Status: Abnormal   Collection Time    07/08/12  2:43 AM      Result Value Range   Troponin I >20.00 (*) <0.30 ng/mL    Intake/Output Summary (Last 24 hours) at 07/08/12 0906 Last data filed at 07/08/12 0800  Gross per 24 hour  Intake 1049.5 ml  Output   1200 ml  Net -150.5 ml    EKG:  NSR, rate 61, no acute ST T wave changes.   ASSESSMENT AND PLAN:  ACUTE NQWMI:  Status post PCI/stent of native circumflex.  OK to transfer to the floor.  Home in the AM.   HTN:  BP is running low.  Continue current therapy.   HYPERLIPIDEMIA:  Lipitor dose has been increased.   Fayrene Fearing [facility] 07/08/2012 9:06 AM

## 2012-07-09 ENCOUNTER — Encounter (HOSPITAL_COMMUNITY): Payer: Self-pay | Admitting: Physician Assistant

## 2012-07-09 MED ORDER — NITROGLYCERIN 0.4 MG SL SUBL: 0.4000 mg | SUBLINGUAL_TABLET | SUBLINGUAL | Status: DC | PRN

## 2012-07-09 MED ORDER — ATORVASTATIN CALCIUM 40 MG PO TABS: 80.0000 mg | ORAL_TABLET | Freq: Every day | ORAL | Status: DC

## 2012-07-09 MED ORDER — CLOPIDOGREL BISULFATE 75 MG PO TABS: 75.0000 mg | ORAL_TABLET | Freq: Every day | ORAL | Status: DC

## 2012-07-09 NOTE — Progress Notes (Signed)
Patient ID: Jonathon [name], male   DOB: 06-12-1945, 67 y.o.   MRN: [id number]   SUBJECTIVE:  Patient is feeling well. He has had no return of chest discomfort since he was treated in the cath lab. He is in good spirits and ready to go home.   Filed Vitals:   07/08/12 1142 07/08/12 1500 07/08/12 2121 07/09/12 0545  BP: 116/77 104/63 117/66 103/60  Pulse: 57 66 60 64  Temp: 98.7 F (37.1 C) 98.6 F (37 C) 98.4 F (36.9 C) 98.4 F (36.9 C)  TempSrc: Oral Oral Oral Oral  Resp: 18 20 18 18   Height: 5\' 10"  (1.778 m)     Weight: 180 lb 8.9 oz (81.9 kg)     SpO2: 99% 99% 100% 97%    Intake/Output Summary (Last 24 hours) at 07/09/12 0831 Last data filed at 07/08/12 1754  Gross per 24 hour  Intake    600 ml  Output      0 ml  Net    600 ml    LABS: Basic Metabolic Panel:  Recent Labs  78/29/56 1604 07/08/12 0242  NA 136 138  K 5.5* 4.0  CL 102 105  CO2 24 25  GLUCOSE 110* 98  BUN 25* 18  CREATININE 1.11 0.91  CALCIUM 9.2 8.8   Liver Function Tests:  Recent Labs  07/07/12 1341 07/07/12 1604  AST 30 45*  ALT 25 25  ALKPHOS 52 49  BILITOT 0.8 0.8  PROT 7.1 6.6  ALBUMIN 4.1 3.8   No results found for this basename: LIPASE, AMYLASE,  in the last 72 hours CBC:  Recent Labs  07/07/12 1341  07/07/12 1604 07/08/12 0242  WBC 10.6*  --  9.1 7.3  NEUTROABS 6.3  --   --   --   HGB 14.7  < > 13.8 13.6  HCT 41.7  < > 39.7 38.8*  MCV 88.9  --  89.0 88.6  PLT 169  --  143* 137*  < > = values in this interval not displayed. Cardiac Enzymes:  Recent Labs  07/07/12 1604 07/07/12 2151 07/08/12 0243 07/09/12 0411  CKTOTAL 535*  --   --   --   CKMB 25.8*  --   --   --   TROPONINI 3.65* >20.00* >20.00* 8.57*   BNP: No components found with this basename: POCBNP,  D-Dimer: No results found for this basename: DDIMER,  in the last 72 hours Hemoglobin A1C:  Recent Labs  07/07/12 1604  HGBA1C 5.1   Fasting Lipid Panel:  Recent Labs  07/08/12 0242  CHOL 126   HDL 41  LDLCALC 72  TRIG 66  CHOLHDL 3.1   Thyroid Function Tests:  Recent Labs  07/07/12 1604  TSH 2.853    RADIOLOGY: No results found.  PHYSICAL EXAM   Patient is oriented to person time and place. Affect is normal. There is no jugulovenous distention. Lungs are clear. Respiratory effort is nonlabored. Cardiac exam reveals S1 and S2. There no significant murmurs. The abdomen is soft. There is no peripheral edema.   TELEMETRY: I reviewed telemetry today Jul 09, 2012. There is sinus rhythm.   ASSESSMENT AND PLAN:    Acute MI, inferior wall, initial episode of care    He was treated successfully with PCI. He's doing well. He is ready to go home.    Hypertension     Blood pressure stable. No change in therapy.  [name] 07/09/2012 8:31 AM

## 2012-07-09 NOTE — Discharge Summary (Signed)
Discharge Summary   Patient ID: Jonathon [name], MRN: [id number], DOB/AGE: [birth year] 67 y.o.  Admit date: 07/07/2012 Discharge date: 07/09/2012   Primary Care Physician:  Oliver Barre   Primary Cardiologist:  Dr. Rollene Rotunda   Reason for Admission:  Inferior ST Elevation Myocardial Infarction  Primary Discharge Diagnoses:  1. Inferior STEMI-Treated with DES to the Native Circumflex 2. CAD 3. Hypertension 4. Hyperlipidemia  Secondary Discharge Diagnoses:   Past Medical History  Diagnosis Date  . Coronary artery disease     a. s/p NSTEMI 12/12 => CABG;  b. inferior STEMI 5/14: 3/3 grafts patent, native CFX 100% => Promus DES   . S/P CABG (coronary artery bypass graft)     12/12 with Dr. Laneta Simmers: LIMA-LAD, SVG-RI, SVG-OM  . Atrial fibrillation     post op after CABG; tx with amiodarone  . HTN (hypertension)   . HLD (hyperlipidemia)   . Sciatica 03/19/2011  . Lumbar disc disease 03/19/2011  . Psoriasis 03/22/2011     Allergies:    Allergies  Allergen Reactions  . Dilaudid (Hydromorphone Hcl) Hypertension    Back and forth with hyper and hypotension     Procedures Performed This Admission:   LHC 07/07/12: Left mainstem: There is eccentric 30-40% stenosis at the origin of the left main.  Left anterior descending (LAD): There is diffuse disease in the proximal LAD to 90%. After the first septal perforator the LAD fills competitively by the IMA graft.  Left circumflex (LCx): The left circumflex is a dominant vessel. It is occluded proximally (culprit)   **PCI: 3.5 x 24 mm Promus Premier DES to proximal CFX** Right coronary artery (RCA): The right coronary is a small nondominant vessel that bifurcates immediately at the origin. The more inferior branch has a 50% stenosis at the origin. The more superior branch has scattered disease up to 30%.  The saphenous vein graft to the first obtuse marginal vessel is widely patent with good runoff.  The saphenous vein graft to the ramus  intermediate branch is widely patent with good runoff.  The LIMA graft to the LAD is widely patent with good runoff.  Left ventriculography: Left ventricular systolic function is normal, LVEF is estimated at 55-60%, there is very mild inferior basal hypokinesis, there is no significant mitral regurgitation     Hospital Course:  Jonathon [name] is a 67 y.o. male with a history of CAD, status post CABG in the setting of non-STEMI in 01/2011, postoperative atrial fibrillation treated with amiodarone, preserved LV function, HTN, HL.  On the date of admission, he developed central chest discomfort associated with diaphoresis and called 911. ECG demonstrated inferior ST elevation and he was brought emergently to the cardiac catheterization lab.  LHC demonstrated patent grafts and a newly occluded native circumflex. He was treated with a Promus premier DES to the proximal circumflex. He tolerated procedure well and had no immediate complications. He remained chest pain-free.  Peak troponin was greater than 20. He remained in the hospital for 48 hours post MI. He was evaluated by Dr. Myrtis Ser this morning and felt to be ready for discharge to home.  He will need dual antiplatelet Rx for one year.   Discharge Vitals:   Blood pressure 118/82, pulse 72, temperature 98.4 F (36.9 C), temperature source Oral, resp. rate 18, height 5\' 10"  (1.778 m), weight 180 lb 8.9 oz (81.9 kg), SpO2 97.00%.  Labs:  Recent Labs  07/07/12 1341 07/07/12 1356 07/07/12 1604 07/08/12 0242  WBC 10.6*  --  9.1 7.3  HGB 14.7 15.0 13.8 13.6  HCT 41.7 44.0 39.7 38.8*  MCV 88.9  --  89.0 88.6  PLT 169  --  143* 137*    Recent Labs  07/07/12 1341 07/07/12 1356 07/07/12 1604 07/08/12 0242  NA 137 139 136 138  K 4.1 4.1 5.5* 4.0  CL 101 106 102 105  CO2 24  --  24 25  BUN 26* 27* 25* 18  CREATININE 1.26 1.30 1.11 0.91  CALCIUM 9.7  --  9.2 8.8  PROT 7.1  --  6.6  --   BILITOT 0.8  --  0.8  --   ALKPHOS 52  --  49  --     ALT 25  --  25  --   AST 30  --  45*  --     Recent Labs  07/07/12 1604 07/07/12 2151 07/08/12 0243 07/09/12 0411  CKTOTAL 535*  --   --   --   CKMB 25.8*  --   --   --   TROPONINI 3.65* >20.00* >20.00* 8.57*   Lab Results  Component Value Date   CHOL 126 07/08/2012   HDL 41 07/08/2012   LDLCALC 72 07/08/2012   TRIG 66 07/08/2012   No results found for this basename: DDIMER   Lab Results  Component Value Date   TSH 2.853 07/07/2012    Recent Labs  07/07/12 1604  INR 1.95*    Diagnostic Procedures and Studies:  No results found.   Disposition:   Pt is being discharged home today in good condition.  Follow-up Plans & Appointments      Follow-up Information   Follow up with Rollene Rotunda, MD In 2 weeks. (office will call to arrange follow up in 1-2 weeks)    Contact information:   [address] [place] [address] [phone]       Follow up with Oliver Barre, MD. (as planned)    Contact information:   [address] [place] [address] [phone]       Discharge Medications    Medication List    STOP taking these medications       lisinopril 10 MG tablet  Commonly known as:  PRINIVIL,ZESTRIL      TAKE these medications       aspirin EC 81 MG tablet  Take 81 mg by mouth at bedtime.     atorvastatin 40 MG tablet  Commonly known as:  LIPITOR  Take 2 tablets (80 mg total) by mouth at bedtime.     clopidogrel 75 MG tablet  Commonly known as:  PLAVIX  Take 1 tablet (75 mg total) by mouth daily with breakfast.     levothyroxine 50 MCG tablet  Commonly known as:  SYNTHROID, LEVOTHROID  Take 50 mcg by mouth daily before breakfast.     metoprolol tartrate 25 MG tablet  Commonly known as:  LOPRESSOR  Take 25 mg by mouth 2 (two) times daily.     nitroGLYCERIN 0.4 MG SL tablet  Commonly known as:  NITROSTAT  Place 1 tablet (0.4 mg total) under the tongue every 5 (five) minutes as needed  for chest pain (CP or SOB).         Outstanding Labs/Studies  1. None   Duration of Discharge Encounter: Greater than 30 minutes including physician and PA time.  Signed, Tereso Newcomer, PA-C  10:19 AM 07/09/2012  Patient seen and examined. I  agree with the assessment and plan as detailed above. See also my additional thoughts below.   Please see my progress note also. I made decision to discharge patient home.  [name], MD, [facility] 07/11/2012 11:04 AM

## 2012-07-10 LAB — POCT ACTIVATED CLOTTING TIME: Activated Clotting Time: 606 seconds

## 2012-07-10 MED FILL — Sodium Chloride IV Soln 0.9%: INTRAVENOUS | Qty: 50 | Status: AC

## 2012-07-27 ENCOUNTER — Encounter: Payer: Self-pay | Admitting: Physician Assistant

## 2012-07-27 ENCOUNTER — Ambulatory Visit (INDEPENDENT_AMBULATORY_CARE_PROVIDER_SITE_OTHER): Payer: BC Managed Care – PPO | Admitting: Physician Assistant

## 2012-07-27 VITALS — BP 116/78 | HR 58 | Ht 70.5 in | Wt 182.4 lb

## 2012-07-27 DIAGNOSIS — I2119 ST elevation (STEMI) myocardial infarction involving other coronary artery of inferior wall: Secondary | ICD-10-CM

## 2012-07-27 DIAGNOSIS — E785 Hyperlipidemia, unspecified: Secondary | ICD-10-CM

## 2012-07-27 DIAGNOSIS — I10 Essential (primary) hypertension: Secondary | ICD-10-CM

## 2012-07-27 DIAGNOSIS — I2510 Atherosclerotic heart disease of native coronary artery without angina pectoris: Secondary | ICD-10-CM

## 2012-07-27 NOTE — Patient Instructions (Addendum)
LABS IN 6 WEEKS FASTING LIPID AND LIVER PANEL  PLEASE FOLLOW UP WITH DR. HOCHREIN IN ABOUT 2 MONTHS

## 2012-07-27 NOTE — Progress Notes (Signed)
[address] [place], [place]  [address] Phone: [phone] Fax:  [phone]  Date:  07/27/2012   ID:  Jonathon [name], DOB [birth year], MRN [id number]  PCP:  Oliver Barre, MD  Cardiologist:  Dr. Rollene Rotunda     History of Present Illness: Jonathon [name] is a 67 y.o. male who returns for f/u after a recent admission to the hospital 5/24-5/26 for an inferior STEMI.  He has a hx of CAD, s/p CABG following a NSTEMI in 01/2011, post op AFib, HTN, HL.  He presented to the hospital with central chest discomfort. Emergent LHC demonstrated patent bypass grafts and a newly occluded native CFX. He was treated with a Promus premier DES to the proximal CFX. Dual antiplatelet therapy recommended for one year. Since discharge, he has felt well. He denies chest pain, shortest breath, syncope, orthopnea, PND or edema.   Labs (5/14): K 4, Cr 0.91, ALT 25, LDL 72, Hgb 13.6, TSH 2.853  Wt Readings from Last 3 Encounters:  07/27/12 182 lb 6.4 oz (82.736 kg)  07/08/12 180 lb 8.9 oz (81.9 kg)  07/08/12 180 lb 8.9 oz (81.9 kg)     Past Medical History  Diagnosis Date  . Coronary artery disease     a. s/p NSTEMI 12/12 => CABG;  b. inferior STEMI 5/14: 3/3 grafts patent, native CFX 100% => Promus DES   . S/P CABG (coronary artery bypass graft)     12/12 with Dr. Laneta Simmers: LIMA-LAD, SVG-RI, SVG-OM  . Atrial fibrillation     post op after CABG; tx with amiodarone  . HTN (hypertension)   . HLD (hyperlipidemia)   . Sciatica 03/19/2011  . Lumbar disc disease 03/19/2011  . Psoriasis 03/22/2011    Current Outpatient Prescriptions  Medication Sig Dispense Refill  . aspirin EC 81 MG tablet Take 81 mg by mouth at bedtime.      Marland Kitchen atorvastatin (LIPITOR) 40 MG tablet Take 2 tablets (80 mg total) by mouth at bedtime.  60 tablet  5  . clopidogrel (PLAVIX) 75 MG tablet Take 1 tablet (75 mg total) by mouth daily with breakfast.  30 tablet  5  . levothyroxine (SYNTHROID, LEVOTHROID) 50 MCG tablet Take  50 mcg by mouth daily before breakfast.      . metoprolol tartrate (LOPRESSOR) 25 MG tablet Take 25 mg by mouth 2 (two) times daily.      . nitroGLYCERIN (NITROSTAT) 0.4 MG SL tablet Place 1 tablet (0.4 mg total) under the tongue every 5 (five) minutes as needed for chest pain (CP or SOB).  25 tablet  12   No current facility-administered medications for this visit.    Allergies:    Allergies  Allergen Reactions  . Dilaudid (Hydromorphone Hcl) Hypertension    Back and forth with hyper and hypotension    Social History:  The patient  reports that he quit smoking about 44 years ago. He has never used smokeless tobacco. He reports that  drinks alcohol. He reports that he does not use illicit drugs.   ROS:  Please see the history of present illness.   All other systems reviewed and negative.   PHYSICAL EXAM: VS:  BP 116/78  Pulse 58  Ht 5' 10.5" (1.791 m)  Wt 182 lb 6.4 oz (82.736 kg)  BMI 25.79 kg/m2 Well nourished, well developed, in no acute distress HEENT: normal Neck: no JVD Cardiac:  normal S1, S2; RRR; no murmur Lungs:  clear to auscultation bilaterally, no wheezing, rhonchi or  rales Abd: soft, nontender, no hepatomegaly Ext: no edema; right groin without hematoma or bruit  Skin: warm and dry Neuro:  CNs 2-12 intact, no focal abnormalities noted  EKG:  Sinus bradycardia, HR 58, normal axis, inferior T-wave inversions, no change from prior tracing     ASSESSMENT AND PLAN:  1. CAD: He is doing well after a recent inferior STEMI treated with a DES to the native circumflex. Cardiac catheterization demonstrated preserved LV function. Continue current therapy with aspirin, Plavix, high-dose statin and beta blocker therapy. He is not interested in formal cardiac rehabilitation. He prefers to increase activity on his own. 2. Hypertension: Controlled. 3. Hyperlipidemia: Continue high-dose statin. Check lipids and LFTs in 6 weeks. 4. Disposition: Follow up with Dr. Antoine Poche in 2  months.  Signed, Tereso Newcomer, PA-C  07/27/2012 1:52 PM

## 2012-09-03 ENCOUNTER — Other Ambulatory Visit: Payer: BC Managed Care – PPO

## 2012-09-05 ENCOUNTER — Other Ambulatory Visit (INDEPENDENT_AMBULATORY_CARE_PROVIDER_SITE_OTHER): Payer: Self-pay

## 2012-09-05 DIAGNOSIS — E785 Hyperlipidemia, unspecified: Secondary | ICD-10-CM

## 2012-09-05 LAB — HEPATIC FUNCTION PANEL
AST: 23 U/L (ref 0–37)
Albumin: 4 g/dL (ref 3.5–5.2)
Alkaline Phosphatase: 48 U/L (ref 39–117)
Total Bilirubin: 1.1 mg/dL (ref 0.3–1.2)

## 2012-09-05 LAB — LIPID PANEL
HDL: 41.7 mg/dL (ref >39.00)
Total CHOL/HDL Ratio: 4
Triglycerides: 98 mg/dL (ref 0.0–149.0)

## 2012-09-07 ENCOUNTER — Other Ambulatory Visit: Payer: Self-pay | Admitting: Internal Medicine

## 2012-09-10 ENCOUNTER — Telehealth: Payer: Self-pay | Admitting: *Deleted

## 2012-09-10 NOTE — Telephone Encounter (Signed)
Message copied by Tarri Fuller on Mon Sep 10, 2012  9:06 AM ------      Message from: [place], [place] T      Created: Fri Sep 07, 2012  1:30 PM       LFTs ok      LDL a little higher - is he taking Lipitor 80 mg QD?      If taking Lipitor 80 every day for last 2 mos -> d/c Lipitor and start Crestor 40 mg QD and repeat L/L in 3 mos      If not taking Lipitor 80 every day, change to Lipitor 80 every day and repeat L/L in 3 mos.      Tereso Newcomer, PA-C        09/07/2012 1:30 PM ------

## 2012-09-10 NOTE — Telephone Encounter (Signed)
pt notified about lab results today. I asked if he had been taking lipitor every day for the last 2 months, pt stated to me yes, except for did not have any since last week due to insurance change (retirement) which will not go in effect until 09/14/12. I advised I will d/w Bing Neighbors. PAC about which direction he wants to go with the medication if go back on lipitor or start crestor. Advised I w/cb with after I d/w PA. Pt verbalized understanding to all instructions.

## 2012-09-12 ENCOUNTER — Other Ambulatory Visit: Payer: Self-pay | Admitting: *Deleted

## 2012-09-12 ENCOUNTER — Telehealth: Payer: Self-pay | Admitting: *Deleted

## 2012-09-12 DIAGNOSIS — E785 Hyperlipidemia, unspecified: Secondary | ICD-10-CM

## 2012-09-12 NOTE — Telephone Encounter (Signed)
S/w pt is aware lab appt is scheduled for 11/14/12 and to come fasting

## 2012-09-13 ENCOUNTER — Other Ambulatory Visit: Payer: Self-pay

## 2012-09-13 MED ORDER — METOPROLOL TARTRATE 25 MG PO TABS: 25.0000 mg | ORAL_TABLET | Freq: Two times a day (BID) | ORAL | Status: DC

## 2012-10-16 ENCOUNTER — Ambulatory Visit (INDEPENDENT_AMBULATORY_CARE_PROVIDER_SITE_OTHER): Payer: Medicare Other | Admitting: Cardiology

## 2012-10-16 ENCOUNTER — Encounter: Payer: Self-pay | Admitting: Cardiology

## 2012-10-16 VITALS — BP 122/72 | HR 61 | Ht 70.0 in | Wt 183.0 lb

## 2012-10-16 DIAGNOSIS — I4891 Unspecified atrial fibrillation: Secondary | ICD-10-CM

## 2012-10-16 DIAGNOSIS — I214 Non-ST elevation (NSTEMI) myocardial infarction: Secondary | ICD-10-CM

## 2012-10-16 DIAGNOSIS — I2510 Atherosclerotic heart disease of native coronary artery without angina pectoris: Secondary | ICD-10-CM

## 2012-10-16 NOTE — Patient Instructions (Addendum)
The current medical regimen is effective;  continue present plan and medications.  Follow up in May 2015 with Dr Antoine Poche.  You will receive a letter in the mail 2 months before you are due.  Please call us when you receive this letter to schedule your follow up appointment.

## 2012-10-16 NOTE — Progress Notes (Signed)
HPI The patient returns for followup after bypass surgery and now stenting.  He was hospitalized in May with central chest discomfort. Emergent LHC demonstrated patent bypass grafts and a newly occluded native CFX. He was treated with a Promus premier DES to the proximal CFX.  Since he was last seen in the clinic he's had no new symptoms. He is active doing yard work. The patient denies any new symptoms such as chest discomfort, neck or arm discomfort. There has been no new shortness of breath, PND or orthopnea. There have been no reported palpitations, presyncope or syncope.  Allergies  Allergen Reactions  . Dilaudid [Hydromorphone Hcl] Hypertension    Back and forth with hyper and hypotension    Current Outpatient Prescriptions  Medication Sig Dispense Refill  . aspirin EC 81 MG tablet Take 81 mg by mouth at bedtime.      Marland Kitchen atorvastatin (LIPITOR) 40 MG tablet Take 2 tablets (80 mg total) by mouth at bedtime.  60 tablet  5  . clopidogrel (PLAVIX) 75 MG tablet Take 1 tablet (75 mg total) by mouth daily with breakfast.  30 tablet  5  . levothyroxine (SYNTHROID, LEVOTHROID) 50 MCG tablet Take 50 mcg by mouth daily before breakfast.      . metoprolol tartrate (LOPRESSOR) 25 MG tablet Take 1 tablet (25 mg total) by mouth 2 (two) times daily.  60 tablet  9  . nitroGLYCERIN (NITROSTAT) 0.4 MG SL tablet Place 1 tablet (0.4 mg total) under the tongue every 5 (five) minutes as needed for chest pain (CP or SOB).  25 tablet  12   No current facility-administered medications for this visit.    Past Medical History  Diagnosis Date  . Coronary artery disease     a. s/p NSTEMI 12/12 => CABG;  b. inferior STEMI 5/14: 3/3 grafts patent, native CFX 100% => Promus DES   . S/P CABG (coronary artery bypass graft)     12/12 with Dr. Laneta Simmers: LIMA-LAD, SVG-RI, SVG-OM  . Atrial fibrillation     post op after CABG; tx with amiodarone  . HTN (hypertension)   . HLD (hyperlipidemia)   . Sciatica 03/19/2011  .  Lumbar disc disease 03/19/2011  . Psoriasis 03/22/2011    Past Surgical History  Procedure Laterality Date  . Tonsillectomy    . Coronary artery bypass graft  01/19/2011    Procedure: CORONARY ARTERY BYPASS GRAFTING (CABG);  Surgeon: Alleen Borne, MD;  Location: [facility] OR;  Service: Open Heart Surgery;  Laterality: N/A;    ROS:  As stated in the HPI and negative for all other systems.  PHYSICAL EXAM BP 122/72  Pulse 61  Ht 5\' 10"  (1.778 m)  Wt 183 lb (83.008 kg)  BMI 26.26 kg/m2  SpO2 98% GENERAL:  Well appearing NECK:  No jugular venous distention, waveform within normal limits, carotid upstroke brisk and symmetric, no bruits, no thyromegaly LYMPHATICS:  No cervical, inguinal adenopathy LUNGS:  Clear to auscultation bilaterally BACK:  No CVA tenderness CHEST:  Well healed sternotomy scar. HEART:  PMI not displaced or sustained,S1 and S2 within normal limits, no S3, no S4, no clicks, no rubs, no murmurs ABD:  Flat, positive bowel sounds normal in frequency in pitch, no bruits, no rebound, no guarding, no midline pulsatile mass, no hepatomegaly, no splenomegaly EXT:  2 plus pulses throughout, no edema, no cyanosis no clubbing   ASSESSMENT AND PLAN   CAD -  The patient has no new sypmtoms.  No further cardiovascular  testing is indicated.  We will continue with aggressive risk reduction and meds as listed.  I will consider stopping the Plavix I see him back in May.   Hypertension - The blood pressure is at target. No change in medications is indicated. We will continue with therapeutic lifestyle changes (TLC).   Hyperlipidemia -  He is back on his statin. He is due to have followup labs in several weeks. We will titrate his meds per guidelines.

## 2012-11-11 IMAGING — CR DG CHEST 2V
2 series · 2 of 2 positions shown · non-contrast
Comparison: Portable chest x-ray 01/20/2011, two-view chest x-ray
01/13/2011 [HOSPITAL].

CLINICAL DATA: Postop CABG 01/19/2011.  Routine follow-up.

CHEST - 2 VIEW 02/14/2011:

[w chest pa]
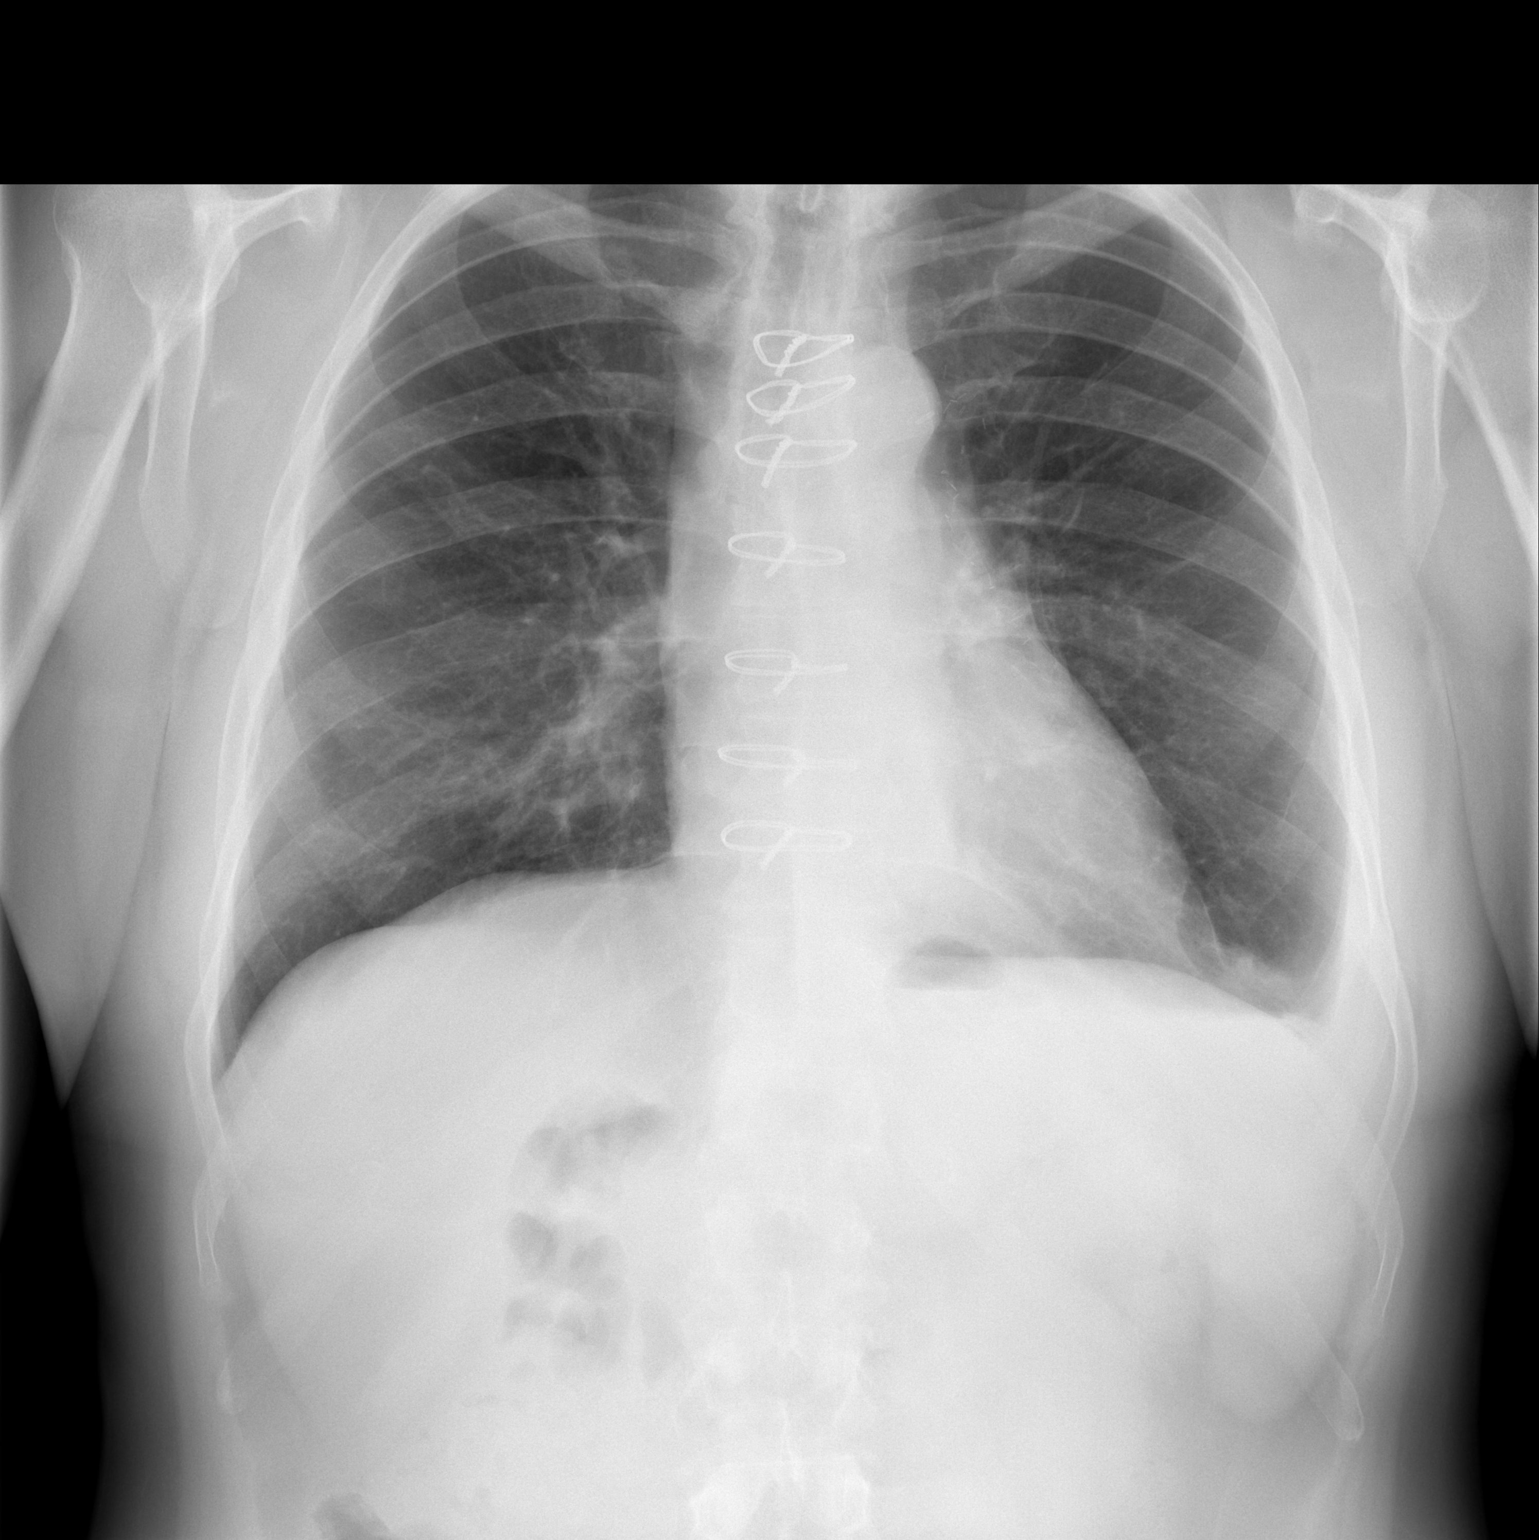

[w chest lat]
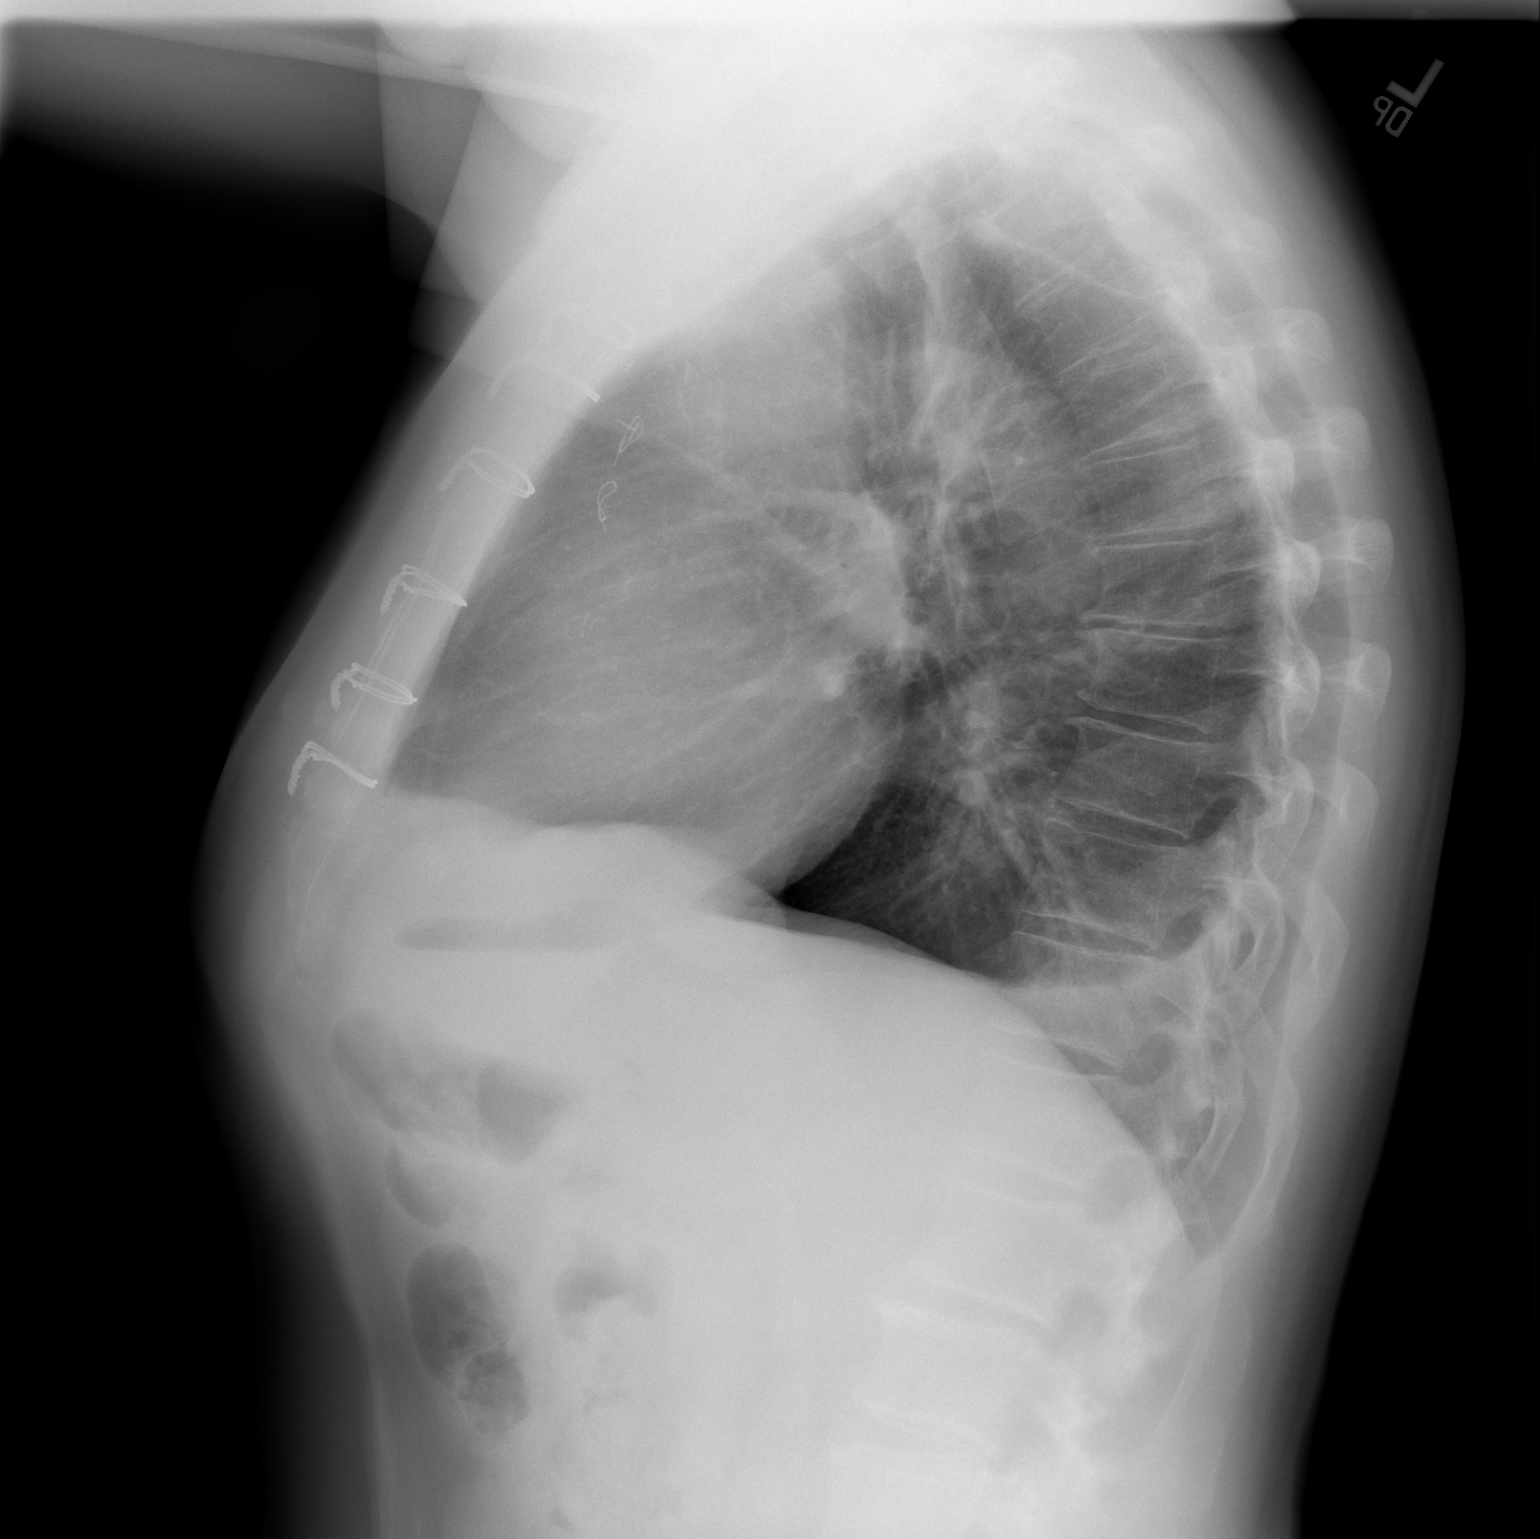

[2 of 2 positions shown; findings below may reference images not displayed]

FINDINGS: Prior sternotomy for CABG.  Cardiac silhouette normal in
size, unchanged.  Hilar and mediastinal contours unremarkable.
Residual small left pleural effusion and associated mild passive
atelectasis in the left lower lobe.  Lungs otherwise clear.
Pulmonary vascularity normal.  No right pleural effusion.
Degenerative changes involving the thoracic spine.  Mild pectus
carinatum sternal deformity.
IMPRESSION: Residual small left pleural effusion and associated mild passive
atelectasis in the left lower lobe.  No acute cardiopulmonary
disease otherwise.

## 2012-11-14 ENCOUNTER — Telehealth: Payer: Self-pay | Admitting: *Deleted

## 2012-11-14 ENCOUNTER — Other Ambulatory Visit (INDEPENDENT_AMBULATORY_CARE_PROVIDER_SITE_OTHER): Payer: Medicare Other

## 2012-11-14 DIAGNOSIS — E785 Hyperlipidemia, unspecified: Secondary | ICD-10-CM

## 2012-11-14 LAB — LIPID PANEL
LDL Cholesterol: 54 mg/dL (ref 0–99)
VLDL: 14.6 mg/dL (ref 0.0–40.0)

## 2012-11-14 LAB — HEPATIC FUNCTION PANEL
ALT: 26 U/L (ref 0–53)
AST: 29 U/L (ref 0–37)
Albumin: 4 g/dL (ref 3.5–5.2)
Alkaline Phosphatase: 44 U/L (ref 39–117)
Total Bilirubin: 0.9 mg/dL (ref 0.3–1.2)

## 2012-11-14 NOTE — Telephone Encounter (Signed)
wife notified about lab results with verbal understanding; I asked if pt keeps track of lipid # but she said she was not sure, I advised he could cb and we will be glad to go over the #'s with him.

## 2012-11-19 ENCOUNTER — Telehealth: Payer: Self-pay | Admitting: Cardiology

## 2012-11-19 MED ORDER — ATORVASTATIN CALCIUM 80 MG PO TABS: 80.0000 mg | ORAL_TABLET | Freq: Every day | ORAL | Status: DC

## 2012-11-19 NOTE — Telephone Encounter (Signed)
Pt called and informed rx sent into pharmacy for 80 mg a day

## 2012-11-19 NOTE — Telephone Encounter (Signed)
New Problem:  Pt states he wants to change one of his meds. Pt states he is taking the generic of  lipitor, 2 tabs at 40 mg.. Pt states he insurance will not cover the 2 tabs... Pt is asking if he can get a RX for the generic of lipitor for 1 tab at 80 mg.

## 2012-12-20 ENCOUNTER — Other Ambulatory Visit: Payer: Self-pay

## 2013-01-09 ENCOUNTER — Other Ambulatory Visit (HOSPITAL_COMMUNITY): Payer: Self-pay | Admitting: Physician Assistant

## 2013-06-13 ENCOUNTER — Other Ambulatory Visit (INDEPENDENT_AMBULATORY_CARE_PROVIDER_SITE_OTHER): Payer: Commercial Managed Care - HMO

## 2013-06-13 ENCOUNTER — Encounter: Payer: Self-pay | Admitting: Internal Medicine

## 2013-06-13 ENCOUNTER — Ambulatory Visit (INDEPENDENT_AMBULATORY_CARE_PROVIDER_SITE_OTHER): Payer: Commercial Managed Care - HMO | Admitting: Internal Medicine

## 2013-06-13 VITALS — BP 110/72 | HR 74 | Temp 98.5°F | Ht 69.5 in | Wt 179.4 lb

## 2013-06-13 DIAGNOSIS — I2510 Atherosclerotic heart disease of native coronary artery without angina pectoris: Secondary | ICD-10-CM

## 2013-06-13 DIAGNOSIS — L409 Psoriasis, unspecified: Secondary | ICD-10-CM

## 2013-06-13 DIAGNOSIS — Z0000 Encounter for general adult medical examination without abnormal findings: Secondary | ICD-10-CM

## 2013-06-13 DIAGNOSIS — Z23 Encounter for immunization: Secondary | ICD-10-CM

## 2013-06-13 DIAGNOSIS — R7309 Other abnormal glucose: Secondary | ICD-10-CM

## 2013-06-13 DIAGNOSIS — R7302 Impaired glucose tolerance (oral): Secondary | ICD-10-CM

## 2013-06-13 DIAGNOSIS — E039 Hypothyroidism, unspecified: Secondary | ICD-10-CM

## 2013-06-13 DIAGNOSIS — L408 Other psoriasis: Secondary | ICD-10-CM

## 2013-06-13 LAB — CBC WITH DIFFERENTIAL/PLATELET
Basophils Absolute: 0 10*3/uL (ref 0.0–0.1)
Basophils Relative: 0.1 % (ref 0.0–3.0)
EOS PCT: 1.7 % (ref 0.0–5.0)
Eosinophils Absolute: 0.1 10*3/uL (ref 0.0–0.7)
HCT: 43.1 % (ref 39.0–52.0)
Hemoglobin: 14.5 g/dL (ref 13.0–17.0)
LYMPHS PCT: 18.8 % (ref 12.0–46.0)
Lymphs Abs: 1.4 10*3/uL (ref 0.7–4.0)
MCHC: 33.6 g/dL (ref 30.0–36.0)
MCV: 91.8 fl (ref 78.0–100.0)
MONO ABS: 0.6 10*3/uL (ref 0.1–1.0)
Monocytes Relative: 8.1 % (ref 3.0–12.0)
NEUTROS PCT: 71.3 % (ref 43.0–77.0)
Neutro Abs: 5.3 10*3/uL (ref 1.4–7.7)
PLATELETS: 169 10*3/uL (ref 150.0–400.0)
RBC: 4.7 Mil/uL (ref 4.22–5.81)
RDW: 13.5 % (ref 11.5–14.6)
WBC: 7.5 10*3/uL (ref 4.5–10.5)

## 2013-06-13 LAB — HEPATIC FUNCTION PANEL
ALT: 28 U/L (ref 0–53)
AST: 31 U/L (ref 0–37)
Albumin: 4.4 g/dL (ref 3.5–5.2)
Alkaline Phosphatase: 51 U/L (ref 39–117)
BILIRUBIN TOTAL: 1.6 mg/dL — AB (ref 0.3–1.2)
Bilirubin, Direct: 0.3 mg/dL (ref 0.0–0.3)
Total Protein: 6.8 g/dL (ref 6.0–8.3)

## 2013-06-13 LAB — TSH: TSH: 5.98 u[IU]/mL — ABNORMAL HIGH (ref 0.35–5.50)

## 2013-06-13 LAB — BASIC METABOLIC PANEL
BUN: 24 mg/dL — AB (ref 6–23)
CHLORIDE: 102 meq/L (ref 96–112)
CO2: 30 meq/L (ref 19–32)
Calcium: 9.2 mg/dL (ref 8.4–10.5)
Creatinine, Ser: 1.1 mg/dL (ref 0.4–1.5)
GFR: 72.34 mL/min (ref >60.00)
Glucose, Bld: 72 mg/dL (ref 70–99)
Potassium: 3.9 mEq/L (ref 3.5–5.1)
Sodium: 138 mEq/L (ref 135–145)

## 2013-06-13 LAB — LIPID PANEL
CHOLESTEROL: 111 mg/dL (ref 0–200)
HDL: 39.2 mg/dL (ref >39.00)
LDL CALC: 61 mg/dL (ref 0–99)
Total CHOL/HDL Ratio: 3
Triglycerides: 55 mg/dL (ref 0.0–149.0)
VLDL: 11 mg/dL (ref 0.0–40.0)

## 2013-06-13 LAB — PSA: PSA: 1.82 ng/mL (ref 0.10–4.00)

## 2013-06-13 LAB — HEMOGLOBIN A1C: Hgb A1c MFr Bld: 5 % (ref 4.6–6.5)

## 2013-06-13 MED ORDER — LEVOTHYROXINE SODIUM 50 MCG PO TABS: 50.0000 ug | ORAL_TABLET | Freq: Every day | ORAL | Status: DC

## 2013-06-13 NOTE — Assessment & Plan Note (Signed)
For f/u card soon

## 2013-06-13 NOTE — Addendum Note (Signed)
Addended by: Sharon Seller B on: 06/13/2013 04:15 PM   Modules accepted: Orders

## 2013-06-13 NOTE — Assessment & Plan Note (Signed)
To refer to derm for f/u opinion on tx

## 2013-06-13 NOTE — Assessment & Plan Note (Signed)
asympt, for a1c 

## 2013-06-13 NOTE — Patient Instructions (Addendum)
You had the Prevnar pneumonia shot today  Please continue all other medications as before, and refills have been done if requested. Please have the pharmacy call with any other refills you may need.  Please continue your efforts at being more active, low cholesterol diet, and weight control.  You are otherwise up to date with prevention measures today.  Please keep your appointments with your specialists as you have planned  You will be contacted regarding the referral for: Cardiology, and dermatology  Please call to have [place] schedule your colonoscopy if you are able to come off the plavix  Please go to the LAB in the Basement (turn left off the elevator) for the tests to be done today  You will be contacted by phone if any changes need to be made immediately.  Otherwise, you will receive a letter about your results with an explanation, but please check with MyChart first.  Please remember to sign up for MyChart if you have not done so, as this will be important to you in the future with finding out test results, communicating by private email, and scheduling acute appointments online when needed.  Please return in 1 year for your yearly visit, or sooner if needed, with Lab testing done 3-5 days before

## 2013-06-13 NOTE — Addendum Note (Signed)
Addended by: Biagio Borg on: 06/13/2013 04:23 PM   Modules accepted: Orders

## 2013-06-13 NOTE — Assessment & Plan Note (Signed)

## 2013-06-13 NOTE — Progress Notes (Signed)
Pre visit review using our clinic review tool, if applicable. No additional management support is needed unless otherwise documented below in the visit note. 

## 2013-06-13 NOTE — Assessment & Plan Note (Signed)
Out of med for 1 yr, ok to re-start, warned of any increase in anginal symptoms

## 2013-06-13 NOTE — Progress Notes (Signed)
Subjective:    Patient ID: Jonathon [name], male    DOB: 08/23/1945, 69 y.o.   MRN: [id number]  HPI  Here for wellness and f/u;  Overall doing ok;  Pt denies CP, worsening SOB, DOE, wheezing, orthopnea, PND, worsening LE edema, palpitations, dizziness or syncope.  Pt denies neurological change such as new headache, facial or extremity weakness.  Pt denies polydipsia, polyuria, or low sugar symptoms. Pt states overall good compliance with treatment and medications, good tolerability, and has been trying to follow lower cholesterol diet.  Pt denies worsening depressive symptoms, suicidal ideation or panic. No fever, night sweats, wt loss, loss of appetite, or other constitutional symptoms.  Pt states good ability with ADL's, has low fall risk, home safety reviewed and adequate, no other significant changes in hearing or vision, and only occasionally active with exercise.  Has had some left and right "weakness" but now more a right knee stiffness only in the am.  Pt continues to have recurring right  LBP without change in severity, bowel or bladder change, fever, wt loss,  worsening LE pain/numbness/weakness, gait change or falls. Still on plavix, plans to see card in may or soone after, if can come off the plavix would then have colonosocpy Past Medical History  Diagnosis Date  . Coronary artery disease     a. s/p NSTEMI 12/12 => CABG;  b. inferior STEMI 5/14: 3/3 grafts patent, native CFX 100% => Promus DES   . S/P CABG (coronary artery bypass graft)     12/12 with Dr. Cyndia Bent: LIMA-LAD, SVG-RI, SVG-OM  . Atrial fibrillation     post op after CABG; tx with amiodarone  . HTN (hypertension)   . HLD (hyperlipidemia)   . Sciatica 03/19/2011  . Lumbar disc disease 03/19/2011  . Psoriasis 03/22/2011   Past Surgical History  Procedure Laterality Date  . Tonsillectomy    . Coronary artery bypass graft  01/19/2011    Procedure: CORONARY ARTERY BYPASS GRAFTING (CABG);  Surgeon: Gaye Pollack, MD;  Location: [facility];  Service: Open Heart Surgery;  Laterality: N/A;    reports that he quit smoking about 45 years ago. He has never used smokeless tobacco. He reports that he drinks alcohol. He reports that he does not use illicit drugs. family history includes Cancer in his mother; Heart disease in his father and mother; Hyperlipidemia in his father. Allergies  Allergen Reactions  . Dilaudid [Hydromorphone Hcl] Hypertension    Back and forth with hyper and hypotension   Current Outpatient Prescriptions on File Prior to Visit  Medication Sig Dispense Refill  . aspirin EC 81 MG tablet Take 81 mg by mouth at bedtime.      Marland Kitchen atorvastatin (LIPITOR) 80 MG tablet Take 1 tablet (80 mg total) by mouth at bedtime.  31 tablet  11  . clopidogrel (PLAVIX) 75 MG tablet TAKE 1 TABLET BY MOUTH DAILY WITH BREAKFAST  30 tablet  5  . metoprolol tartrate (LOPRESSOR) 25 MG tablet Take 1 tablet (25 mg total) by mouth 2 (two) times daily.  60 tablet  9  . nitroGLYCERIN (NITROSTAT) 0.4 MG SL tablet Place 1 tablet (0.4 mg total) under the tongue every 5 (five) minutes as needed for chest pain (CP or SOB).  25 tablet  12   No current facility-administered medications on file prior to visit.   Review of Systems Constitutional: Negative for increased diaphoresis, other activity, appetite or other siginficant weight change  HENT: Negative for worsening hearing loss, ear  pain, facial swelling, mouth sores and neck stiffness.   Eyes: Negative for other worsening pain, redness or visual disturbance.  Respiratory: Negative for shortness of breath and wheezing.   Cardiovascular: Negative for chest pain and palpitations.  Gastrointestinal: Negative for diarrhea, blood in stool, abdominal distention or other pain Genitourinary: Negative for hematuria, flank pain or change in urine volume.  Musculoskeletal: Negative for myalgias or other joint complaints.  Skin: Negative for color change and wound.  Neurological: Negative for syncope and  numbness. other than noted Hematological: Negative for adenopathy. or other swelling Psychiatric/Behavioral: Negative for hallucinations, self-injury, decreased concentration or other worsening agitation.      Objective:   Physical Exam BP 110/72  Pulse 74  Temp(Src) 98.5 F (36.9 C) (Oral)  Ht 5' 9.5" (1.765 m)  Wt 179 lb 6 oz (81.364 kg)  BMI 26.12 kg/m2  SpO2 97% VS noted,  Constitutional: Pt is oriented to person, place, and time. Appears well-developed and well-nourished.  Head: Normocephalic and atraumatic.  Right Ear: External ear normal.  Left Ear: External ear normal.  Nose: Nose normal.  Mouth/Throat: Oropharynx is clear and moist.  Eyes: Conjunctivae and EOM are normal. Pupils are equal, round, and reactive to light.  Neck: Normal range of motion. Neck supple. No JVD present. No tracheal deviation present.  Cardiovascular: Normal rate, regular rhythm, normal heart sounds and intact distal pulses.   Pulmonary/Chest: Effort normal and breath sounds without rales or wheezing  Abdominal: Soft. Bowel sounds are normal. NT. No HSM  Musculoskeletal: Normal range of motion. Exhibits no edema.  Lymphadenopathy:  Has no cervical adenopathy.  Neurological: Pt is alert and oriented to person, place, and time. Pt has normal reflexes. No cranial nerve deficit. Motor grossly intact Skin: Skin is warm and dry. No rash noted. except chronic severe NT facial erythema Psychiatric:  Has normal mood and affect. Behavior is normal.     Assessment & Plan:

## 2013-06-14 LAB — URINALYSIS, ROUTINE W REFLEX MICROSCOPIC
BILIRUBIN URINE: NEGATIVE
HGB URINE DIPSTICK: NEGATIVE
KETONES UR: NEGATIVE
Leukocytes, UA: NEGATIVE
NITRITE: NEGATIVE
PH: 6 (ref 5.0–8.0)
RBC / HPF: NONE SEEN (ref 0–?)
Specific Gravity, Urine: 1.02 (ref 1.000–1.030)
TOTAL PROTEIN, URINE-UPE24: NEGATIVE
Urine Glucose: NEGATIVE
Urobilinogen, UA: 0.2 (ref 0.0–1.0)
WBC, UA: NONE SEEN (ref 0–?)

## 2013-06-28 ENCOUNTER — Ambulatory Visit (INDEPENDENT_AMBULATORY_CARE_PROVIDER_SITE_OTHER): Payer: Commercial Managed Care - HMO | Admitting: Cardiology

## 2013-06-28 ENCOUNTER — Encounter: Payer: Self-pay | Admitting: Cardiology

## 2013-06-28 VITALS — BP 142/80 | HR 60 | Wt 178.8 lb

## 2013-06-28 DIAGNOSIS — I2119 ST elevation (STEMI) myocardial infarction involving other coronary artery of inferior wall: Secondary | ICD-10-CM

## 2013-06-28 DIAGNOSIS — Z79899 Other long term (current) drug therapy: Secondary | ICD-10-CM

## 2013-06-28 DIAGNOSIS — I214 Non-ST elevation (NSTEMI) myocardial infarction: Secondary | ICD-10-CM

## 2013-06-28 DIAGNOSIS — I2510 Atherosclerotic heart disease of native coronary artery without angina pectoris: Secondary | ICD-10-CM

## 2013-06-28 DIAGNOSIS — I10 Essential (primary) hypertension: Secondary | ICD-10-CM

## 2013-06-28 DIAGNOSIS — E7800 Pure hypercholesterolemia, unspecified: Secondary | ICD-10-CM

## 2013-06-28 MED ORDER — ROSUVASTATIN CALCIUM 40 MG PO TABS
40.0000 mg | ORAL_TABLET | Freq: Every day | ORAL | Status: DC
Start: 2013-06-28 — End: 2014-06-27

## 2013-06-28 NOTE — Progress Notes (Signed)
HPI The patient returns for followup after bypass surgery and subsequent circ stenting.  He had an occluded native CFX treated with a Promus premier DES in May of 2014.  Since he was last seen in the clinic he's had no new symptoms. He is active doing yard work. The patient denies any new symptoms such as chest discomfort, neck or arm discomfort. There has been no new shortness of breath, PND or orthopnea. There have been no reported palpitations, presyncope or syncope.  He remains quite active.  He takes care of several yards.   Allergies  Allergen Reactions  . Dilaudid [Hydromorphone Hcl] Hypertension    Back and forth with hyper and hypotension    Current Outpatient Prescriptions  Medication Sig Dispense Refill  . aspirin EC 81 MG tablet Take 81 mg by mouth at bedtime.      . clopidogrel (PLAVIX) 75 MG tablet TAKE 1 TABLET BY MOUTH DAILY WITH BREAKFAST  30 tablet  5  . levothyroxine (SYNTHROID, LEVOTHROID) 50 MCG tablet Take 1 tablet (50 mcg total) by mouth daily before breakfast.  90 tablet  3  . metoprolol tartrate (LOPRESSOR) 25 MG tablet Take 1 tablet (25 mg total) by mouth 2 (two) times daily.  60 tablet  9  . nitroGLYCERIN (NITROSTAT) 0.4 MG SL tablet Place 1 tablet (0.4 mg total) under the tongue every 5 (five) minutes as needed for chest pain (CP or SOB).  25 tablet  12  . rosuvastatin (CRESTOR) 40 MG tablet Take 1 tablet (40 mg total) by mouth daily.  30 tablet  11   No current facility-administered medications for this visit.    Past Medical History  Diagnosis Date  . Coronary artery disease     a. s/p NSTEMI 12/12 => CABG;  b. inferior STEMI 5/14: 3/3 grafts patent, native CFX 100% => Promus DES   . S/P CABG (coronary artery bypass graft)     12/12 with Dr. Cyndia Bent: LIMA-LAD, SVG-RI, SVG-OM  . Atrial fibrillation     post op after CABG; tx with amiodarone  . HTN (hypertension)   . HLD (hyperlipidemia)   . Sciatica 03/19/2011  . Lumbar disc disease 03/19/2011  . Psoriasis  03/22/2011    Past Surgical History  Procedure Laterality Date  . Tonsillectomy    . Coronary artery bypass graft  01/19/2011    Procedure: CORONARY ARTERY BYPASS GRAFTING (CABG);  Surgeon: Gaye Pollack, MD;  Location: [facility];  Service: Open Heart Surgery;  Laterality: N/A;    ROS:  As stated in the HPI and negative for all other systems.  PHYSICAL EXAM BP 142/80  Pulse 60  Wt 178 lb 12.8 oz (81.103 kg) GENERAL:  Well appearing NECK:  No jugular venous distention, waveform within normal limits, carotid upstroke brisk and symmetric, no bruits, no thyromegaly LYMPHATICS:  No cervical, inguinal adenopathy LUNGS:  Clear to auscultation bilaterally BACK:  No CVA tenderness CHEST:  Well healed sternotomy scar. HEART:  PMI not displaced or sustained,S1 and S2 within normal limits, no S3, no S4, no clicks, no rubs, no murmurs ABD:  Flat, positive bowel sounds normal in frequency in pitch, no bruits, no rebound, no guarding, no midline pulsatile mass, no hepatomegaly, no splenomegaly EXT:  2 plus pulses throughout, no edema, no cyanosis no clubbing  EKG:    Sinus rhythm, rate 60, axis within normal limits, intervals within normal limits, no acute ST-T wave changes.  06/28/2013  ASSESSMENT AND PLAN   CAD -  The  patient has no new sypmtoms.  No further cardiovascular testing is indicated.  We will continue with aggressive risk reduction and meds as listed.  I will consider DAPT after careful consideration.     Hypertension - The blood pressure is at target. No change in medications is indicated. We will continue with therapeutic lifestyle changes (TLC).   Hyperlipidemia -  He is having muscle aches.  He would like to go back to Crestor and we will start 40 mg daily.  He can get a lipid and liver profile in 10 weeks.

## 2013-06-28 NOTE — Patient Instructions (Signed)
Please stop your Lipitor. Start Crestor 40 mg a day. Continue all other medications as listed.  Please return in 8 weeks for a fasting lipid and liver panel.  Follow up in 1 year with Dr Percival Spanish.  You will receive a letter in the mail 2 months before you are due.  Please call us when you receive this letter to schedule your follow up appointment.

## 2013-07-15 ENCOUNTER — Other Ambulatory Visit: Payer: Self-pay

## 2013-07-15 MED ORDER — CLOPIDOGREL BISULFATE 75 MG PO TABS: ORAL_TABLET | ORAL | Status: DC

## 2013-07-15 MED ORDER — METOPROLOL TARTRATE 25 MG PO TABS: 25.0000 mg | ORAL_TABLET | Freq: Two times a day (BID) | ORAL | Status: DC

## 2013-08-19 ENCOUNTER — Other Ambulatory Visit: Payer: Self-pay | Admitting: Cardiology

## 2013-08-19 ENCOUNTER — Other Ambulatory Visit: Payer: Commercial Managed Care - HMO

## 2013-08-19 ENCOUNTER — Telehealth: Payer: Self-pay | Admitting: Cardiology

## 2013-08-19 ENCOUNTER — Other Ambulatory Visit: Payer: Self-pay | Admitting: *Deleted

## 2013-08-19 DIAGNOSIS — E7800 Pure hypercholesterolemia, unspecified: Secondary | ICD-10-CM

## 2013-08-19 DIAGNOSIS — Z79899 Other long term (current) drug therapy: Secondary | ICD-10-CM

## 2013-08-19 LAB — HEPATIC FUNCTION PANEL
ALT: 20 U/L (ref 0–53)
AST: 23 U/L (ref 0–37)
Albumin: 4.2 g/dL (ref 3.5–5.2)
Alkaline Phosphatase: 44 U/L (ref 39–117)
BILIRUBIN DIRECT: 0.2 mg/dL (ref 0.0–0.3)
BILIRUBIN INDIRECT: 0.5 mg/dL (ref 0.2–1.2)
Total Bilirubin: 0.7 mg/dL (ref 0.2–1.2)
Total Protein: 6.4 g/dL (ref 6.0–8.3)

## 2013-08-19 LAB — LIPID PANEL
Cholesterol: 110 mg/dL (ref 0–200)
HDL: 42 mg/dL (ref >39)
LDL CALC: 53 mg/dL (ref 0–99)
Total CHOL/HDL Ratio: 2.6 Ratio
Triglycerides: 73 mg/dL (ref <150)
VLDL: 15 mg/dL (ref 0–40)

## 2013-08-19 NOTE — Telephone Encounter (Signed)
Lab order for Lipid & Liver Profile released and faxed to [facility].

## 2013-08-19 NOTE — Telephone Encounter (Signed)
Needs lab order---Patient is there now.

## 2013-08-29 ENCOUNTER — Ambulatory Visit (INDEPENDENT_AMBULATORY_CARE_PROVIDER_SITE_OTHER): Payer: Commercial Managed Care - HMO | Admitting: Internal Medicine

## 2013-08-29 ENCOUNTER — Encounter: Payer: Self-pay | Admitting: Internal Medicine

## 2013-08-29 VITALS — BP 118/68 | HR 87 | Temp 98.3°F | Resp 16 | Wt 178.0 lb

## 2013-08-29 DIAGNOSIS — L738 Other specified follicular disorders: Secondary | ICD-10-CM

## 2013-08-29 DIAGNOSIS — L678 Other hair color and hair shaft abnormalities: Secondary | ICD-10-CM

## 2013-08-29 DIAGNOSIS — L03115 Cellulitis of right lower limb: Secondary | ICD-10-CM

## 2013-08-29 DIAGNOSIS — L739 Follicular disorder, unspecified: Secondary | ICD-10-CM

## 2013-08-29 DIAGNOSIS — L02419 Cutaneous abscess of limb, unspecified: Secondary | ICD-10-CM

## 2013-08-29 DIAGNOSIS — L03119 Cellulitis of unspecified part of limb: Secondary | ICD-10-CM

## 2013-08-29 MED ORDER — MUPIROCIN 2 % EX OINT: TOPICAL_OINTMENT | CUTANEOUS | Status: DC

## 2013-08-29 NOTE — Progress Notes (Signed)
Pre visit review using our clinic review tool, if applicable. No additional management support is needed unless otherwise documented below in the visit note. 

## 2013-08-29 NOTE — Progress Notes (Signed)
   Subjective:    Patient ID: Jonathon [name], male    DOB: March 29, 1945, 68 y.o.   MRN: [id number]  HPI Pt presents today with an open sore on his R leg that he believes started out as a bug bite approximately 1 week ago. The site is located on the lateral to posterior aspect of his R thigh. The site does not itch nor is it painful. The site has gotten redder and larger over the past few weeks. He has not tried any remedies. He has been covering it with bandaids daily. He denies fever, fatigue, chills or sweats. Denies GI symptoms or neurologic symptoms    Review of Systems  Constitutional: Negative for fever, chills and fatigue.  Gastrointestinal: Negative for nausea and abdominal pain.  Neurological: Negative for headaches.      Objective:   Physical Exam  Erythematous ulcer on lateral aspect of R thigh with epithelization  Two 1x1 erythematous papules on anterior aspect of R thigh with small pustular heads        Assessment & Plan:

## 2013-08-29 NOTE — Patient Instructions (Signed)
Dip gauze in  sterile saline and applied to the wound twice a day. Apply antibiotic ointment & cover the wound with Telfa , non stick dressing  . The saline can be purchased at the drugstore or you can make your own .Boil cup of salt in a gallon of water. Store mixture  in a clean container.Report Warning  signs as discussed (red streaks, pus, fever, increasing pain).

## 2013-08-29 NOTE — Progress Notes (Signed)
   Subjective:    Patient ID: Jonathon [name], male    DOB: [birth year], 68 y.o.   MRN: [id number]  HPI   He noted a small papule incidentally while bathing over the right posterior thigh approximately one week ago. Subsequently it has progressed to form a small ulcer with circumferential erythema. There's been no frank purulent drainage. He also denies fever, chills, or sweats.  There was no definite vector identified.  He has no history of MRSA. PMH of psoriasis    Review of Systems  He feels well without malaise, headache, respiratory symptoms,abdominal pain or changes in bowels.  He has noted small red papules over the anterior thigh as well which have not progressed.     Objective:   Physical Exam  There is a 30 x 24 mm lesion of the right posterior thigh which has an erythematous border. Centrally there is shallow ulcer formation. There is no associated purulence.  There are 2 smaller faintly erythematous papules over the right anterior thigh without pustule formation.  He appears healthy and well-nourished.  Neck suple  There is no conjunctivitis or scleral icterus  He has no lymphadenopathy about the head, neck, or axilla  Chest is clear with no increased work of breathing  He has an S4 with no murmurs or gallops.  No organomegaly or masses present. There is no tenderness to palpation of the abdomen  Pedal pulses are excellent.        Assessment & Plan:  #1 cellulitis and ulcer formation right posterior thigh  #2 two areas suggestive of folliculitis without pustule formation or ulceration right anterior thigh See orders & AVS

## 2013-12-09 ENCOUNTER — Telehealth: Payer: Self-pay | Admitting: Cardiology

## 2013-12-09 NOTE — Telephone Encounter (Signed)
10.26.15 Received from [facility] for [place] Dr Mcarthur Rossetti to be completed and signed by Dr Percival Spanish.  Sent to [facility] @ [place] on 12/09/13 for letter/packet for ROI signed and payment.  lp

## 2013-12-12 NOTE — Telephone Encounter (Signed)
10.29.15  Received [employer]-Questionaire from [facility] @ [facility].  Given to Dominica Severin RN for Dr Michelle Piper to review and sign.  12/12/13 lp

## 2013-12-12 NOTE — Telephone Encounter (Signed)
Waiting for fax and I will give it to Dr. Percival Spanish in the AM

## 2013-12-24 ENCOUNTER — Telehealth: Payer: Self-pay | Admitting: Cardiology

## 2013-12-24 NOTE — Telephone Encounter (Signed)
[employer] Cornary Artery Disease Questionaire Form faxed.  lp

## 2014-01-13 ENCOUNTER — Other Ambulatory Visit: Payer: Self-pay

## 2014-01-13 MED ORDER — CLOPIDOGREL BISULFATE 75 MG PO TABS: ORAL_TABLET | ORAL | Status: DC

## 2014-01-22 ENCOUNTER — Encounter (HOSPITAL_COMMUNITY): Payer: Self-pay | Admitting: Cardiology

## 2014-01-23 ENCOUNTER — Encounter (HOSPITAL_COMMUNITY): Payer: Self-pay | Admitting: Cardiology

## 2014-05-16 ENCOUNTER — Telehealth: Payer: Self-pay | Admitting: Cardiology

## 2014-05-16 ENCOUNTER — Other Ambulatory Visit: Payer: Self-pay

## 2014-05-16 MED ORDER — METOPROLOL TARTRATE 25 MG PO TABS: 25.0000 mg | ORAL_TABLET | Freq: Two times a day (BID) | ORAL | Status: DC

## 2014-05-16 NOTE — Telephone Encounter (Signed)
Patient states [facility] faxed refill for his Metoprolol Tartrate 25 mg 1 BID last week and still has not heard anything.  Patient would like a 60 day instead of 30 day supply if possible.  [facility] on the [address] of [address].

## 2014-06-08 ENCOUNTER — Other Ambulatory Visit: Payer: Self-pay | Admitting: Internal Medicine

## 2014-06-16 ENCOUNTER — Telehealth: Payer: Self-pay | Admitting: Internal Medicine

## 2014-06-16 DIAGNOSIS — I2583 Coronary atherosclerosis due to lipid rich plaque: Principal | ICD-10-CM

## 2014-06-16 DIAGNOSIS — I2510 Atherosclerotic heart disease of native coronary artery without angina pectoris: Secondary | ICD-10-CM

## 2014-06-16 NOTE — Telephone Encounter (Signed)
Patient needs referral to see cardiologist.

## 2014-06-16 NOTE — Telephone Encounter (Signed)
Referral done

## 2014-06-26 ENCOUNTER — Other Ambulatory Visit (INDEPENDENT_AMBULATORY_CARE_PROVIDER_SITE_OTHER): Payer: Commercial Managed Care - HMO

## 2014-06-26 ENCOUNTER — Encounter: Payer: Self-pay | Admitting: Internal Medicine

## 2014-06-26 ENCOUNTER — Ambulatory Visit (INDEPENDENT_AMBULATORY_CARE_PROVIDER_SITE_OTHER): Payer: Commercial Managed Care - HMO | Admitting: Internal Medicine

## 2014-06-26 VITALS — BP 114/72 | HR 64 | Temp 97.8°F | Wt 180.0 lb

## 2014-06-26 DIAGNOSIS — Z0000 Encounter for general adult medical examination without abnormal findings: Secondary | ICD-10-CM

## 2014-06-26 DIAGNOSIS — R7302 Impaired glucose tolerance (oral): Secondary | ICD-10-CM

## 2014-06-26 DIAGNOSIS — Z23 Encounter for immunization: Secondary | ICD-10-CM

## 2014-06-26 DIAGNOSIS — L739 Follicular disorder, unspecified: Secondary | ICD-10-CM

## 2014-06-26 DIAGNOSIS — L03115 Cellulitis of right lower limb: Secondary | ICD-10-CM

## 2014-06-26 LAB — LIPID PANEL
Cholesterol: 119 mg/dL (ref 0–200)
HDL: 45.9 mg/dL (ref >39.00)
LDL Cholesterol: 60 mg/dL (ref 0–99)
NonHDL: 73.1
TRIGLYCERIDES: 65 mg/dL (ref 0.0–149.0)
Total CHOL/HDL Ratio: 3
VLDL: 13 mg/dL (ref 0.0–40.0)

## 2014-06-26 LAB — BASIC METABOLIC PANEL
BUN: 19 mg/dL (ref 6–23)
CHLORIDE: 104 meq/L (ref 96–112)
CO2: 29 meq/L (ref 19–32)
Calcium: 9.6 mg/dL (ref 8.4–10.5)
Creatinine, Ser: 1.1 mg/dL (ref 0.40–1.50)
GFR: 70.6 mL/min (ref >60.00)
Glucose, Bld: 108 mg/dL — ABNORMAL HIGH (ref 70–99)
POTASSIUM: 4.7 meq/L (ref 3.5–5.1)
Sodium: 139 mEq/L (ref 135–145)

## 2014-06-26 LAB — CBC WITH DIFFERENTIAL/PLATELET
Basophils Absolute: 0 10*3/uL (ref 0.0–0.1)
Basophils Relative: 0.4 % (ref 0.0–3.0)
Eosinophils Absolute: 0.1 10*3/uL (ref 0.0–0.7)
Eosinophils Relative: 1.7 % (ref 0.0–5.0)
HCT: 43.6 % (ref 39.0–52.0)
HEMOGLOBIN: 15 g/dL (ref 13.0–17.0)
Lymphocytes Relative: 20.5 % (ref 12.0–46.0)
Lymphs Abs: 1.3 10*3/uL (ref 0.7–4.0)
MCHC: 34.4 g/dL (ref 30.0–36.0)
MCV: 88.1 fl (ref 78.0–100.0)
MONO ABS: 0.6 10*3/uL (ref 0.1–1.0)
Monocytes Relative: 10.3 % (ref 3.0–12.0)
Neutro Abs: 4.2 10*3/uL (ref 1.4–7.7)
Neutrophils Relative %: 67.1 % (ref 43.0–77.0)
PLATELETS: 162 10*3/uL (ref 150.0–400.0)
RBC: 4.95 Mil/uL (ref 4.22–5.81)
RDW: 13.4 % (ref 11.5–15.5)
WBC: 6.2 10*3/uL (ref 4.0–10.5)

## 2014-06-26 LAB — HEPATIC FUNCTION PANEL
ALBUMIN: 4.2 g/dL (ref 3.5–5.2)
ALT: 19 U/L (ref 0–53)
AST: 27 U/L (ref 0–37)
Alkaline Phosphatase: 54 U/L (ref 39–117)
BILIRUBIN DIRECT: 0.2 mg/dL (ref 0.0–0.3)
BILIRUBIN TOTAL: 0.8 mg/dL (ref 0.2–1.2)
Total Protein: 7.1 g/dL (ref 6.0–8.3)

## 2014-06-26 LAB — URINALYSIS, ROUTINE W REFLEX MICROSCOPIC
Bilirubin Urine: NEGATIVE
Leukocytes, UA: NEGATIVE
Nitrite: NEGATIVE
PH: 5.5 (ref 5.0–8.0)
Total Protein, Urine: NEGATIVE
URINE GLUCOSE: 100 — AB
Urobilinogen, UA: 0.2 (ref 0.0–1.0)

## 2014-06-26 LAB — PSA: PSA: 2.15 ng/mL (ref 0.10–4.00)

## 2014-06-26 LAB — TSH: TSH: 2.11 u[IU]/mL (ref 0.35–4.50)

## 2014-06-26 MED ORDER — MUPIROCIN 2 % EX OINT: TOPICAL_OINTMENT | CUTANEOUS | Status: DC

## 2014-06-26 NOTE — Progress Notes (Signed)
Pre visit review using our clinic review tool, if applicable. No additional management support is needed unless otherwise documented below in the visit note. 

## 2014-06-26 NOTE — Assessment & Plan Note (Signed)

## 2014-06-26 NOTE — Patient Instructions (Signed)
Please take all new medication as prescribed - the cream  Please continue all other medications as before, and refills have been done if requested.  Please have the pharmacy call with any other refills you may need.  Please continue your efforts at being more active, low cholesterol diet, and weight control.  You are otherwise up to date with prevention measures today.  Please keep your appointments with your specialists as you may have planned  Please go to the LAB in the Basement (turn left off the elevator) for the tests to be done today  You will be contacted by phone if any changes need to be made immediately.  Otherwise, you will receive a letter about your results with an explanation, but please check with MyChart first.  Please remember to sign up for MyChart if you have not done so, as this will be important to you in the future with finding out test results, communicating by private email, and scheduling acute appointments online when needed.  Please return in 1 year for your yearly visit, or sooner if needed, with Lab testing done 3-5 days before

## 2014-06-26 NOTE — Progress Notes (Signed)
Subjective:    Patient ID: Jonathon [name], male    DOB: [birth year], 69 y.o.   MRN: [id number]  HPI  Here for wellness and f/u;  Overall doing ok;  Pt denies Chest pain, worsening SOB, DOE, wheezing, orthopnea, PND, worsening LE edema, palpitations, dizziness or syncope.  Pt denies neurological change such as new headache, facial or extremity weakness.  Pt denies polydipsia, polyuria, or low sugar symptoms. Pt states overall good compliance with treatment and medications, good tolerability, and has been trying to follow appropriate diet.  Pt denies worsening depressive symptoms, suicidal ideation or panic. No fever, night sweats, wt loss, loss of appetite, or other constitutional symptoms.  Pt states good ability with ADL's, has low fall risk, home safety reviewed and adequate, no other significant changes in hearing or vision, and only occasionally active with exercise. Sees card on regular basis.  No pain overall. No new complaints.  Except does have a red area to right post mid thigh with some tenderness, has seen Dr Linna Darner in past, improved with mupirocin topical Has hx of psoriasis but seems different Past Medical History  Diagnosis Date  . Coronary artery disease     a. s/p NSTEMI 12/12 => CABG;  b. inferior STEMI 5/14: 3/3 grafts patent, native CFX 100% => Promus DES   . S/P CABG (coronary artery bypass graft)     12/12 with Dr. Cyndia Bent: LIMA-LAD, SVG-RI, SVG-OM  . Atrial fibrillation     post op after CABG; tx with amiodarone  . HTN (hypertension)   . HLD (hyperlipidemia)   . Sciatica 03/19/2011  . Lumbar disc disease 03/19/2011  . Psoriasis 03/22/2011   Past Surgical History  Procedure Laterality Date  . Tonsillectomy    . Coronary artery bypass graft  01/19/2011    Procedure: CORONARY ARTERY BYPASS GRAFTING (CABG);  Surgeon: Gaye Pollack, MD;  Location: [place];  Service: Open Heart Surgery;  Laterality: N/A;  . Left heart catheterization with coronary angiogram N/A 01/13/2011   Procedure: LEFT HEART CATHETERIZATION WITH CORONARY ANGIOGRAM;  Surgeon: Peter M Martinique, MD;  Location: [facility] CATH LAB;  Service: Cardiovascular;  Laterality: N/A;  . Left heart cath N/A 07/07/2012    Procedure: LEFT HEART CATH;  Surgeon: Peter M Martinique, MD;  Location: [facility] CATH LAB;  Service: Cardiovascular;  Laterality: N/A;  . Percutaneous coronary stent intervention (pci-s)  07/07/2012    Procedure: PERCUTANEOUS CORONARY STENT INTERVENTION (PCI-S);  Surgeon: Peter M Martinique, MD;  Location: [facility] CATH LAB;  Service: Cardiovascular;;    reports that he quit smoking about 46 years ago. He has never used smokeless tobacco. He reports that he drinks alcohol. He reports that he does not use illicit drugs. family history includes Cancer in his mother; Heart disease in his father and mother; Hyperlipidemia in his father. Allergies  Allergen Reactions  . Dilaudid [Hydromorphone Hcl] Hypertension    Back and forth with hyper and hypotension   Current Outpatient Prescriptions on File Prior to Visit  Medication Sig Dispense Refill  . aspirin EC 81 MG tablet Take 81 mg by mouth at bedtime.    . clopidogrel (PLAVIX) 75 MG tablet TAKE 1 TABLET BY MOUTH DAILY WITH BREAKFAST 30 tablet 5  . levothyroxine (SYNTHROID, LEVOTHROID) 50 MCG tablet TAKE 1 TABLET BY MOUTH DAILY BEFORE BREAKFAST 90 tablet 0  . metoprolol tartrate (LOPRESSOR) 25 MG tablet Take 1 tablet (25 mg total) by mouth 2 (two) times daily. 60 tablet 2  . mupirocin ointment (BACTROBAN) 2 %  Applied twice a day to the affected area;NOT into eyes. 22 g 0  . nitroGLYCERIN (NITROSTAT) 0.4 MG SL tablet Place 1 tablet (0.4 mg total) under the tongue every 5 (five) minutes as needed for chest pain (CP or SOB). 25 tablet 12  . rosuvastatin (CRESTOR) 40 MG tablet Take 1 tablet (40 mg total) by mouth daily. 30 tablet 11   No current facility-administered medications on file prior to visit.   Review of Systems Constitutional: Negative for increased diaphoresis,  other activity, appetite or siginficant weight change other than noted HENT: Negative for worsening hearing loss, ear pain, facial swelling, mouth sores and neck stiffness.   Eyes: Negative for other worsening pain, redness or visual disturbance.  Respiratory: Negative for shortness of breath and wheezing  Cardiovascular: Negative for chest pain and palpitations.  Gastrointestinal: Negative for diarrhea, blood in stool, abdominal distention or other pain Genitourinary: Negative for hematuria, flank pain or change in urine volume.  Musculoskeletal: Negative for myalgias or other joint complaints.  Skin: Negative for color change and wound or drainage.  Neurological: Negative for syncope and numbness. other than noted Hematological: Negative for adenopathy. or other swelling Psychiatric/Behavioral: Negative for hallucinations, SI, self-injury, decreased concentration or other worsening agitation.      Objective:   Physical Exam BP 114/72 mmHg  Pulse 64  Temp(Src) 97.8 F (36.6 C) (Oral)  Wt 180 lb (81.647 kg)  SpO2 97% VS noted,  Constitutional: Pt is oriented to person, place, and time. Appears well-developed and well-nourished, in no significant distress Head: Normocephalic and atraumatic.  Right Ear: External ear normal.  Left Ear: External ear normal.  Nose: Nose normal.  Mouth/Throat: Oropharynx is clear and moist.  Eyes: Conjunctivae and EOM are normal. Pupils are equal, round, and reactive to light.  Neck: Normal range of motion. Neck supple. No JVD present. No tracheal deviation present or significant neck LA or mass Cardiovascular: Normal rate, regular rhythm, normal heart sounds and intact distal pulses.   Pulmonary/Chest: Effort normal and breath sounds without rales or wheezing  Abdominal: Soft. Bowel sounds are normal. NT. No HSM  Musculoskeletal: Normal range of motion. Exhibits no edema.  Lymphadenopathy:  Has no cervical adenopathy.  Neurological: Pt is alert and  oriented to person, place, and time. Pt has normal reflexes. No cranial nerve deficit. Motor grossly intact Skin: Skin is warm and dry. No rash noted. except for 2 cm oval area right post mid thigh erythema minor tender nonvesicular nonraised non weepy, no red streaks or drainage Psychiatric:  Has normal mood and affect. Behavior is normal.     Assessment & Plan:

## 2014-06-27 ENCOUNTER — Telehealth: Payer: Self-pay | Admitting: Cardiology

## 2014-06-27 MED ORDER — ROSUVASTATIN CALCIUM 40 MG PO TABS: 40.0000 mg | ORAL_TABLET | Freq: Every day | ORAL | Status: DC

## 2014-06-27 NOTE — Telephone Encounter (Signed)
Rx(s) sent to pharmacy electronically. Patient notified. 

## 2014-06-27 NOTE — Telephone Encounter (Signed)
°  1. Which medications need to be refilled? Crestor   2. Which pharmacy is medication to be sent to?[facility] at [address] and [address]  3. Do they need a 30 day or 90 day supply? Did not specify   4. Would they like a call back once the medication has been sent to the pharmacy? yes

## 2014-07-01 ENCOUNTER — Other Ambulatory Visit: Payer: Self-pay | Admitting: Cardiology

## 2014-08-08 ENCOUNTER — Other Ambulatory Visit: Payer: Self-pay | Admitting: Cardiology

## 2014-08-25 ENCOUNTER — Ambulatory Visit (INDEPENDENT_AMBULATORY_CARE_PROVIDER_SITE_OTHER): Payer: Commercial Managed Care - HMO | Admitting: Cardiology

## 2014-08-25 ENCOUNTER — Encounter: Payer: Self-pay | Admitting: Cardiology

## 2014-08-25 VITALS — BP 132/76 | HR 60 | Ht 69.0 in | Wt 181.0 lb

## 2014-08-25 DIAGNOSIS — I2510 Atherosclerotic heart disease of native coronary artery without angina pectoris: Secondary | ICD-10-CM | POA: Diagnosis not present

## 2014-08-25 DIAGNOSIS — I10 Essential (primary) hypertension: Secondary | ICD-10-CM

## 2014-08-25 MED ORDER — NITROGLYCERIN 0.4 MG SL SUBL: 0.4000 mg | SUBLINGUAL_TABLET | SUBLINGUAL | Status: DC | PRN

## 2014-08-25 NOTE — Patient Instructions (Addendum)
Your physician has recommended you make the following change in your medication: STOP clopidogrel (plavix)  Your physician wants you to follow-up in: 1 year with Dr. Percival Spanish. You will receive a reminder letter in the mail two months in advance. If you don't receive a letter, please call our office to schedule the follow-up appointment.  Your nitroglycerin has been refilled.

## 2014-08-25 NOTE — Progress Notes (Signed)
HPI The patient returns for followup after bypass surgery and subsequent circ stenting.  He had an occluded native CFX treated with a Promus premier DES in May of 2014.  Since he was last seen in the clinic he's had no new symptoms. 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 The patient denies any new symptoms such as chest discomfort, neck or arm discomfort. There has been no new shortness of breath, PND or orthopnea. There have been no reported palpitations, presyncope or syncope.  He remains quite active.  He still takes care of several yards.   Allergies  Allergen Reactions  . Dilaudid [Hydromorphone Hcl] Hypertension    Back and forth with hyper and hypotension    Current Outpatient Prescriptions  Medication Sig Dispense Refill  . aspirin EC 81 MG tablet Take 81 mg by mouth at bedtime.    . clopidogrel (PLAVIX) 75 MG tablet TAKE 1 TABLET BY MOUTH EVERY DAY WITH BREAKFAST 30 tablet 1  . levothyroxine (SYNTHROID, LEVOTHROID) 50 MCG tablet TAKE 1 TABLET BY MOUTH DAILY BEFORE BREAKFAST 90 tablet 0  . metoprolol tartrate (LOPRESSOR) 25 MG tablet TAKE 1 TABLET BY MOUTH TWICE DAILY( APPOINTMENT NEEDED) 60 tablet 3  . mupirocin ointment (BACTROBAN) 2 % Applied twice a day to the affected area;NOT into eyes. 22 g 0  . nitroGLYCERIN (NITROSTAT) 0.4 MG SL tablet Place 1 tablet (0.4 mg total) under the tongue every 5 (five) minutes as needed for chest pain (CP or SOB). 25 tablet 12  . rosuvastatin (CRESTOR) 40 MG tablet Take 1 tablet (40 mg total) by mouth daily. 90 tablet 0   No current facility-administered medications for this visit.    Past Medical History  Diagnosis Date  . Coronary artery disease     a. s/p NSTEMI 12/12 => CABG;  b. inferior STEMI 5/14: 3/3 grafts patent, native CFX 100% => Promus DES   . S/P CABG (coronary artery bypass graft)     12/12 with Dr. Cyndia Bent: LIMA-LAD, SVG-RI, SVG-OM  . Atrial fibrillation     post op after CABG; tx with amiodarone  . HTN  (hypertension)   . HLD (hyperlipidemia)   . Sciatica 03/19/2011  . Lumbar disc disease 03/19/2011  . Psoriasis 03/22/2011    Past Surgical History  Procedure Laterality Date  . Tonsillectomy    . Coronary artery bypass graft  01/19/2011    Procedure: CORONARY ARTERY BYPASS GRAFTING (CABG);  Surgeon: Gaye Pollack, MD;  Location: [facility];  Service: Open Heart Surgery;  Laterality: N/A;  . Left heart catheterization with coronary angiogram N/A 01/13/2011    Procedure: LEFT HEART CATHETERIZATION WITH CORONARY ANGIOGRAM;  Surgeon: Peter M Martinique, MD;  Location: [facility] CATH LAB;  Service: Cardiovascular;  Laterality: N/A;  . Left heart cath N/A 07/07/2012    Procedure: LEFT HEART CATH;  Surgeon: Peter M Martinique, MD;  Location: [facility] LAB;  Service: Cardiovascular;  Laterality: N/A;  . Percutaneous coronary stent intervention (pci-s)  07/07/2012    Procedure: PERCUTANEOUS CORONARY STENT INTERVENTION (PCI-S);  Surgeon: Peter M Martinique, MD;  Location: [facility] C[facility] LAB;  Service: Cardiovascular;;    ROS:  As stated in the HPI and negative for all other systems.  PHYSICAL EXAM BP 132/76 mmHg  Pulse 60  Ht 5\' 9"  (1.753 m)  Wt 181 lb (82.101 kg)  BMI 26.72 kg/m2 GENERAL:  Well appearing NECK:  No jugular venous distention, waveform within normal limits, carotid upstroke brisk and symmetric, no bruits,  no thyromegaly LYMPHATICS:  No cervical, inguinal adenopathy LUNGS:  Clear to auscultation bilaterally BACK:  No CVA tenderness CHEST:  Well healed sternotomy scar. HEART:  PMI not displaced or sustained,S1 and S2 within normal limits, no S3, no S4, no clicks, no rubs, no murmurs ABD:  Flat, positive bowel sounds normal in frequency in pitch, no bruits, no rebound, no guarding, no midline pulsatile mass, no hepatomegaly, no splenomegaly EXT:  2 plus pulses throughout, no edema, no cyanosis no clubbing  EKG:    Sinus rhythm, rate 60, axis within normal limits, intervals within normal limits, no acute ST-T wave  changes.  08/25/2014  ASSESSMENT AND PLAN   CAD -  The patient has no new sypmtoms.  No further cardiovascular testing is indicated.  We will continue with aggressive risk reduction.  He can stop his Plavix.     Hypertension - The blood pressure is at target. No change in medications is indicated. We will continue with therapeutic lifestyle changes (TLC).   Hyperlipidemia -  I reviewed the lipid profile.  Hisl HDL was 60 with an HDL of 45.9 in May.  He tolerates the Crestor and he will continue this med at this dose.

## 2014-09-07 ENCOUNTER — Other Ambulatory Visit: Payer: Self-pay | Admitting: Internal Medicine

## 2014-09-08 ENCOUNTER — Other Ambulatory Visit: Payer: Self-pay

## 2014-09-08 MED ORDER — LEVOTHYROXINE SODIUM 50 MCG PO TABS: ORAL_TABLET | ORAL | Status: DC

## 2014-09-28 ENCOUNTER — Other Ambulatory Visit: Payer: Self-pay | Admitting: Cardiology

## 2014-12-01 ENCOUNTER — Other Ambulatory Visit: Payer: Self-pay

## 2014-12-01 MED ORDER — METOPROLOL TARTRATE 25 MG PO TABS: 25.0000 mg | ORAL_TABLET | Freq: Two times a day (BID) | ORAL | Status: DC

## 2015-02-15 HISTORY — PX: COLONOSCOPY: SHX174

## 2015-03-07 ENCOUNTER — Other Ambulatory Visit: Payer: Self-pay | Admitting: Internal Medicine

## 2015-04-05 ENCOUNTER — Other Ambulatory Visit: Payer: Self-pay | Admitting: Cardiology

## 2015-04-06 NOTE — Telephone Encounter (Signed)
REFILL 

## 2015-06-30 ENCOUNTER — Ambulatory Visit (INDEPENDENT_AMBULATORY_CARE_PROVIDER_SITE_OTHER): Payer: Commercial Managed Care - HMO | Admitting: Internal Medicine

## 2015-06-30 ENCOUNTER — Other Ambulatory Visit (INDEPENDENT_AMBULATORY_CARE_PROVIDER_SITE_OTHER): Payer: Commercial Managed Care - HMO

## 2015-06-30 ENCOUNTER — Other Ambulatory Visit: Payer: Self-pay | Admitting: Internal Medicine

## 2015-06-30 ENCOUNTER — Encounter: Payer: Self-pay | Admitting: Internal Medicine

## 2015-06-30 VITALS — BP 136/72 | HR 62 | Temp 98.2°F | Resp 20 | Wt 186.0 lb

## 2015-06-30 DIAGNOSIS — R6889 Other general symptoms and signs: Secondary | ICD-10-CM

## 2015-06-30 DIAGNOSIS — Z0001 Encounter for general adult medical examination with abnormal findings: Secondary | ICD-10-CM

## 2015-06-30 DIAGNOSIS — E039 Hypothyroidism, unspecified: Secondary | ICD-10-CM

## 2015-06-30 DIAGNOSIS — R81 Glycosuria: Secondary | ICD-10-CM | POA: Diagnosis not present

## 2015-06-30 DIAGNOSIS — R7302 Impaired glucose tolerance (oral): Secondary | ICD-10-CM | POA: Diagnosis not present

## 2015-06-30 DIAGNOSIS — Z1159 Encounter for screening for other viral diseases: Secondary | ICD-10-CM | POA: Diagnosis not present

## 2015-06-30 DIAGNOSIS — R972 Elevated prostate specific antigen [PSA]: Secondary | ICD-10-CM

## 2015-06-30 DIAGNOSIS — I10 Essential (primary) hypertension: Secondary | ICD-10-CM | POA: Diagnosis not present

## 2015-06-30 DIAGNOSIS — Z1211 Encounter for screening for malignant neoplasm of colon: Secondary | ICD-10-CM

## 2015-06-30 DIAGNOSIS — N3000 Acute cystitis without hematuria: Secondary | ICD-10-CM

## 2015-06-30 DIAGNOSIS — I2510 Atherosclerotic heart disease of native coronary artery without angina pectoris: Secondary | ICD-10-CM

## 2015-06-30 LAB — HEPATIC FUNCTION PANEL
ALK PHOS: 52 U/L (ref 39–117)
ALT: 20 U/L (ref 0–53)
AST: 27 U/L (ref 0–37)
Albumin: 4.4 g/dL (ref 3.5–5.2)
BILIRUBIN DIRECT: 0.2 mg/dL (ref 0.0–0.3)
Total Bilirubin: 1 mg/dL (ref 0.2–1.2)
Total Protein: 7.3 g/dL (ref 6.0–8.3)

## 2015-06-30 LAB — CBC WITH DIFFERENTIAL/PLATELET
Basophils Absolute: 0.1 10*3/uL (ref 0.0–0.1)
Basophils Relative: 0.9 % (ref 0.0–3.0)
EOS PCT: 2.3 % (ref 0.0–5.0)
Eosinophils Absolute: 0.2 10*3/uL (ref 0.0–0.7)
HEMATOCRIT: 45.3 % (ref 39.0–52.0)
Hemoglobin: 15.4 g/dL (ref 13.0–17.0)
LYMPHS ABS: 1.5 10*3/uL (ref 0.7–4.0)
LYMPHS PCT: 19.7 % (ref 12.0–46.0)
MCHC: 33.9 g/dL (ref 30.0–36.0)
MCV: 89.8 fl (ref 78.0–100.0)
MONOS PCT: 7.7 % (ref 3.0–12.0)
Monocytes Absolute: 0.6 10*3/uL (ref 0.1–1.0)
NEUTROS ABS: 5.4 10*3/uL (ref 1.4–7.7)
NEUTROS PCT: 69.4 % (ref 43.0–77.0)
PLATELETS: 166 10*3/uL (ref 150.0–400.0)
RBC: 5.04 Mil/uL (ref 4.22–5.81)
RDW: 13.5 % (ref 11.5–15.5)
WBC: 7.7 10*3/uL (ref 4.0–10.5)

## 2015-06-30 LAB — URINALYSIS, ROUTINE W REFLEX MICROSCOPIC
BILIRUBIN URINE: NEGATIVE
Nitrite: NEGATIVE
SPECIFIC GRAVITY, URINE: 1.025 (ref 1.000–1.030)
URINE GLUCOSE: NEGATIVE
Urobilinogen, UA: 0.2 (ref 0.0–1.0)
pH: 5.5 (ref 5.0–8.0)

## 2015-06-30 LAB — BASIC METABOLIC PANEL
BUN: 18 mg/dL (ref 6–23)
CHLORIDE: 105 meq/L (ref 96–112)
CO2: 30 mEq/L (ref 19–32)
Calcium: 9.6 mg/dL (ref 8.4–10.5)
Creatinine, Ser: 1.11 mg/dL (ref 0.40–1.50)
GFR: 69.66 mL/min (ref >60.00)
Glucose, Bld: 99 mg/dL (ref 70–99)
POTASSIUM: 4.5 meq/L (ref 3.5–5.1)
SODIUM: 142 meq/L (ref 135–145)

## 2015-06-30 LAB — PSA: PSA: 3.66 ng/mL (ref 0.10–4.00)

## 2015-06-30 LAB — LIPID PANEL
CHOL/HDL RATIO: 3
CHOLESTEROL: 117 mg/dL (ref 0–200)
HDL: 34.6 mg/dL — ABNORMAL LOW (ref >39.00)
LDL Cholesterol: 65 mg/dL (ref 0–99)
NonHDL: 82.71
TRIGLYCERIDES: 88 mg/dL (ref 0.0–149.0)
VLDL: 17.6 mg/dL (ref 0.0–40.0)

## 2015-06-30 LAB — HEMOGLOBIN A1C: HEMOGLOBIN A1C: 5.7 % (ref 4.6–6.5)

## 2015-06-30 LAB — TSH: TSH: 5.12 u[IU]/mL — ABNORMAL HIGH (ref 0.35–4.50)

## 2015-06-30 MED ORDER — CEPHALEXIN 500 MG PO CAPS: 500.0000 mg | ORAL_CAPSULE | Freq: Four times a day (QID) | ORAL | Status: DC

## 2015-06-30 NOTE — Patient Instructions (Addendum)

## 2015-06-30 NOTE — Assessment & Plan Note (Signed)
With very mild hyperglycemia only in past intemittent, for a1c, cont diet, wt control,  to f/u any worsening symptoms or concerns

## 2015-06-30 NOTE — Assessment & Plan Note (Signed)
stable overall by history and exam, recent data reviewed with pt, and pt to continue medical treatment as before,  to f/u any worsening symptoms or concerns Lab Results  Component Value Date   TSH 2.11 06/26/2014    for f/u today

## 2015-06-30 NOTE — Assessment & Plan Note (Signed)

## 2015-06-30 NOTE — Progress Notes (Signed)
Pre visit review using our clinic review tool, if applicable. No additional management support is needed unless otherwise documented below in the visit note. 

## 2015-06-30 NOTE — Assessment & Plan Note (Signed)
stable overall by history and exam, recent data reviewed with pt, and pt to continue medical treatment as before,  to f/u any worsening symptoms or concerns BP Readings from Last 3 Encounters:  06/30/15 136/72  08/25/14 132/76  06/26/14 114/72

## 2015-06-30 NOTE — Progress Notes (Signed)
Subjective:    Patient ID: Jonathon [name], male    DOB: 1945-02-22, 70 y.o.   MRN: [id number]  HPI  Here for wellness and f/u;  Overall doing ok;  Pt denies Chest pain, worsening SOB, DOE, wheezing, orthopnea, PND, worsening LE edema, palpitations, dizziness or syncope.  Pt denies neurological change such as new headache, facial or extremity weakness.  Pt denies polydipsia, polyuria, or low sugar symptoms. Pt states overall good compliance with treatment and medications, good tolerability, and has been trying to follow appropriate diet.  Pt denies worsening depressive symptoms, suicidal ideation or panic. No fever, night sweats, wt loss, loss of appetite, or other constitutional symptoms.  Pt states good ability with ADL's, has low fall risk, home safety reviewed and adequate, no other significant changes in hearing or vision, and only occasionally active with exercise. Interestingly Several brothers (3)  - ALL have glucosuria without DM for over 40 yrs.   Pt denies polydipsia, polyuria, or low sugar symptoms such as weakness or confusion improved with po intake.  Pt states overall good compliance with meds, trying to follow lower cholesterol diet, wt overall stable but little exercise however. Denies hyper or hypo thyroid symptoms such as voice, skin or hair change. Past Medical History  Diagnosis Date  . Coronary artery disease     a. s/p NSTEMI 12/12 => CABG;  b. inferior STEMI 5/14: 3/3 grafts patent, native CFX 100% => Promus DES   . S/P CABG (coronary artery bypass graft)     12/12 with Dr. Cyndia Bent: LIMA-LAD, SVG-RI, SVG-OM  . Atrial fibrillation ([place])     post op after CABG; tx with amiodarone  . HTN (hypertension)   . HLD (hyperlipidemia)   . Sciatica 03/19/2011  . Lumbar disc disease 03/19/2011  . Psoriasis 03/22/2011   Past Surgical History  Procedure Laterality Date  . Tonsillectomy    . Coronary artery bypass graft  01/19/2011    Procedure: CORONARY ARTERY BYPASS GRAFTING (CABG);   Surgeon: Gaye Pollack, MD;  Location: [place];  Service: Open Heart Surgery;  Laterality: N/A;  . Left heart catheterization with coronary angiogram N/A 01/13/2011    Procedure: LEFT HEART CATHETERIZATION WITH CORONARY ANGIOGRAM;  Surgeon: Peter M Martinique, MD;  Location: [facility] CATH LAB;  Service: Cardiovascular;  Laterality: N/A;  . Left heart cath N/A 07/07/2012    Procedure: LEFT HEART CATH;  Surgeon: Peter M Martinique, MD;  Location: [facility] C[facility] LAB;  Service: Cardiovascular;  Laterality: N/A;  . Percutaneous coronary stent intervention (pci-s)  07/07/2012    Procedure: PERCUTANEOUS CORONARY STENT INTERVENTION (PCI-S);  Surgeon: Peter M Martinique, MD;  Location: [facility] CATH LAB;  Service: Cardiovascular;;    reports that he quit smoking about 47 years ago. He has never used smokeless tobacco. He reports that he drinks alcohol. He reports that he does not use illicit drugs. family history includes Cancer in his mother; Heart disease in his father and mother; Hyperlipidemia in his father. Allergies  Allergen Reactions  . Dilaudid [Hydromorphone Hcl] Hypertension    Back and forth with hyper and hypotension   Current Outpatient Prescriptions on File Prior to Visit  Medication Sig Dispense Refill  . aspirin EC 81 MG tablet Take 81 mg by mouth at bedtime.    . CRESTOR 40 MG tablet TAKE 1 TABLET(40 MG) BY MOUTH DAILY 90 tablet 3  . levothyroxine (SYNTHROID, LEVOTHROID) 50 MCG tablet TAKE 1 TABLET BY MOUTH DAILY BEFORE BREAKFAST 90 tablet 1  . metoprolol tartrate (  LOPRESSOR) 25 MG tablet TAKE 1 TABLET(25 MG) BY MOUTH TWICE DAILY 60 tablet 6  . nitroGLYCERIN (NITROSTAT) 0.4 MG SL tablet Place 1 tablet (0.4 mg total) under the tongue every 5 (five) minutes as needed for chest pain. 25 tablet 3   No current facility-administered medications on file prior to visit.     Review of Systems Constitutional: Negative for increased diaphoresis, or other activity, appetite or siginficant weight change other than noted HENT:  Negative for worsening hearing loss, ear pain, facial swelling, mouth sores and neck stiffness.   Eyes: Negative for other worsening pain, redness or visual disturbance.  Respiratory: Negative for choking or stridor Cardiovascular: Negative for other chest pain and palpitations.  Gastrointestinal: Negative for worsening diarrhea, blood in stool, or abdominal distention Genitourinary: Negative for hematuria, flank pain or change in urine volume.  Musculoskeletal: Negative for myalgias or other joint complaints.  Skin: Negative for other color change and wound or drainage.  Neurological: Negative for syncope and numbness. other than noted Hematological: Negative for adenopathy. or other swelling Psychiatric/Behavioral: Negative for hallucinations, SI, self-injury, decreased concentration or other worsening agitation.      Objective:   Physical Exam BP 136/72 mmHg  Pulse 62  Temp(Src) 98.2 F (36.8 C) (Oral)  Resp 20  Wt 186 lb (84.369 kg)  SpO2 97% VS noted,  Constitutional: Pt is oriented to person, place, and time. Appears well-developed and well-nourished, in no significant distress Head: Normocephalic and atraumatic  Eyes: Conjunctivae and EOM are normal. Pupils are equal, round, and reactive to light Right Ear: External ear normal.  Left Ear: External ear normal Nose: Nose normal.  Mouth/Throat: Oropharynx is clear and moist  Neck: Normal range of motion. Neck supple. No JVD present. No tracheal deviation present or significant neck LA or mass Cardiovascular: Normal rate, regular rhythm, normal heart sounds and intact distal pulses.   Pulmonary/Chest: Effort normal and breath sounds without rales or wheezing  Abdominal: Soft. Bowel sounds are normal. NT. No HSM  Musculoskeletal: Normal range of motion. Exhibits no edema Lymphadenopathy: Has no cervical adenopathy.  Neurological: Pt is alert and oriented to person, place, and time. Pt has normal reflexes. No cranial nerve  deficit. Motor grossly intact Skin: Skin is warm and dry. No rash noted or new ulcers Psychiatric:  Has normal mood and affect. Behavior is normal.     Assessment & Plan:

## 2015-06-30 NOTE — Assessment & Plan Note (Signed)
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

## 2015-07-01 LAB — HEPATITIS C ANTIBODY: HCV Ab: NEGATIVE

## 2015-07-02 ENCOUNTER — Encounter: Payer: Self-pay | Admitting: Gastroenterology

## 2015-07-15 ENCOUNTER — Encounter: Payer: Self-pay | Admitting: Internal Medicine

## 2015-07-28 ENCOUNTER — Ambulatory Visit (AMBULATORY_SURGERY_CENTER): Payer: Self-pay

## 2015-07-28 ENCOUNTER — Encounter: Payer: Self-pay | Admitting: Gastroenterology

## 2015-07-28 DIAGNOSIS — Z800 Family history of malignant neoplasm of digestive organs: Secondary | ICD-10-CM

## 2015-07-28 NOTE — Progress Notes (Signed)
Per pt, no allergies to soy or egg products.Pt not taking any weight loss meds or using  O2 at home. 

## 2015-07-31 DIAGNOSIS — R972 Elevated prostate specific antigen [PSA]: Secondary | ICD-10-CM | POA: Diagnosis not present

## 2015-08-10 ENCOUNTER — Encounter: Payer: Self-pay | Admitting: Gastroenterology

## 2015-08-10 ENCOUNTER — Ambulatory Visit (AMBULATORY_SURGERY_CENTER): Payer: Commercial Managed Care - HMO | Admitting: Gastroenterology

## 2015-08-10 VITALS — BP 102/66 | HR 60 | Temp 99.0°F | Resp 14 | Ht 69.0 in | Wt 187.0 lb

## 2015-08-10 DIAGNOSIS — Z1211 Encounter for screening for malignant neoplasm of colon: Secondary | ICD-10-CM

## 2015-08-10 DIAGNOSIS — I10 Essential (primary) hypertension: Secondary | ICD-10-CM | POA: Diagnosis not present

## 2015-08-10 DIAGNOSIS — Z951 Presence of aortocoronary bypass graft: Secondary | ICD-10-CM | POA: Diagnosis not present

## 2015-08-10 DIAGNOSIS — Z800 Family history of malignant neoplasm of digestive organs: Secondary | ICD-10-CM | POA: Diagnosis not present

## 2015-08-10 DIAGNOSIS — I4891 Unspecified atrial fibrillation: Secondary | ICD-10-CM | POA: Diagnosis not present

## 2015-08-10 DIAGNOSIS — D125 Benign neoplasm of sigmoid colon: Secondary | ICD-10-CM | POA: Diagnosis not present

## 2015-08-10 DIAGNOSIS — E039 Hypothyroidism, unspecified: Secondary | ICD-10-CM | POA: Diagnosis not present

## 2015-08-10 DIAGNOSIS — D649 Anemia, unspecified: Secondary | ICD-10-CM | POA: Diagnosis not present

## 2015-08-10 MED ORDER — SODIUM CHLORIDE 0.9 % IV SOLN: 500.0000 mL | INTRAVENOUS | Status: DC

## 2015-08-10 NOTE — Progress Notes (Signed)
Called to room to assist during endoscopic procedure.  Patient ID and intended procedure confirmed with present staff. Received instructions for my participation in the procedure from the performing physician.  

## 2015-08-10 NOTE — Patient Instructions (Signed)
YOU HAD AN ENDOSCOPIC PROCEDURE TODAY AT THE [facility]:   Refer to the procedure report that was given to you for any specific questions about what was found during the examination.  If the procedure report does not answer your questions, please call your gastroenterologist to clarify.  If you requested that your care partner not be given the details of your procedure findings, then the procedure report has been included in a sealed envelope for you to review at your convenience later.  YOU SHOULD EXPECT: Some feelings of bloating in the abdomen. Passage of more gas than usual.  Walking can help get rid of the air that was put into your GI tract during the procedure and reduce the bloating. If you had a lower endoscopy (such as a colonoscopy or flexible sigmoidoscopy) you may notice spotting of blood in your stool or on the toilet paper. If you underwent a bowel prep for your procedure, you may not have a normal bowel movement for a few days.  Please Note:  You might notice some irritation and congestion in your nose or some drainage.  This is from the oxygen used during your procedure.  There is no need for concern and it should clear up in a day or so.  SYMPTOMS TO REPORT IMMEDIATELY:   Following lower endoscopy (colonoscopy or flexible sigmoidoscopy):  Excessive amounts of blood in the stool  Significant tenderness or worsening of abdominal pains  Swelling of the abdomen that is new, acute  Fever of 100F or higher   Following upper endoscopy (EGD)  Vomiting of blood or coffee ground material  New chest pain or pain under the shoulder blades  Painful or persistently difficult swallowing  New shortness of breath  Fever of 100F or higher  Black, tarry-looking stools  For urgent or emergent issues, a gastroenterologist can be reached at any hour by calling [phone].   DIET: Your first meal following the procedure should be a small meal and then it is ok to progress to  your normal diet. Heavy or fried foods are harder to digest and may make you feel nauseous or bloated.  Likewise, meals heavy in dairy and vegetables can increase bloating.  Drink plenty of fluids but you should avoid alcoholic beverages for 24 hours.  ACTIVITY:  You should plan to take it easy for the rest of today and you should NOT DRIVE or use heavy machinery until tomorrow (because of the sedation medicines used during the test).    FOLLOW UP: Our staff will call the number listed on your records the next business day following your procedure to check on you and address any questions or concerns that you may have regarding the information given to you following your procedure. If we do not reach you, we will leave a message.  However, if you are feeling well and you are not experiencing any problems, there is no need to return our call.  We will assume that you have returned to your regular daily activities without incident.  If any biopsies were taken you will be contacted by phone or by letter within the next 1-3 weeks.  Please call us at [phone] if you have not heard about the biopsies in 3 weeks.    SIGNATURES/CONFIDENTIALITY: You and/or your care partner have signed paperwork which will be entered into your electronic medical record.  These signatures attest to the fact that that the information above on your After Visit Summary has been reviewed   and is understood.  Full responsibility of the confidentiality of this discharge information lies with you and/or your care-partner.YOU HAD AN ENDOSCOPIC PROCEDURE TODAY AT [facility]:   Refer to the procedure report that was given to you for any specific questions about what was found during the examination.  If the procedure report does not answer your questions, please call your gastroenterologist to clarify.  If you requested that your care partner not be given the details of your procedure findings, then the procedure  report has been included in a sealed envelope for you to review at your convenience later.  YOU SHOULD EXPECT: Some feelings of bloating in the abdomen. Passage of more gas than usual.  Walking can help get rid of the air that was put into your GI tract during the procedure and reduce the bloating. If you had a lower endoscopy (such as a colonoscopy or flexible sigmoidoscopy) you may notice spotting of blood in your stool or on the toilet paper. If you underwent a bowel prep for your procedure, you may not have a normal bowel movement for a few days.  Please Note:  You might notice some irritation and congestion in your nose or some drainage.  This is from the oxygen used during your procedure.  There is no need for concern and it should clear up in a day or so.  SYMPTOMS TO REPORT IMMEDIATELY:   Following lower endoscopy (colonoscopy or flexible sigmoidoscopy):  Excessive amounts of blood in the stool  Significant tenderness or worsening of abdominal pains  Swelling of the abdomen that is new, acute  Fever of 100F or higher   Following upper endoscopy (EGD)  Vomiting of blood or coffee ground material  New chest pain or pain under the shoulder blades  Painful or persistently difficult swallowing  New shortness of breath  Fever of 100F or higher  Black, tarry-looking stools  For urgent or emergent issues, a gastroenterologist can be reached at any hour by calling [phone].   DIET: Your first meal following the procedure should be a small meal and then it is ok to progress to your normal diet. Heavy or fried foods are harder to digest and may make you feel nauseous or bloated.  Likewise, meals heavy in dairy and vegetables can increase bloating.  Drink plenty of fluids but you should avoid alcoholic beverages for 24 hours.  ACTIVITY:  You should plan to take it easy for the rest of today and you should NOT DRIVE or use heavy machinery until tomorrow (because of the sedation  medicines used during the test).    FOLLOW UP: Our staff will call the number listed on your records the next business day following your procedure to check on you and address any questions or concerns that you may have regarding the information given to you following your procedure. If we do not reach you, we will leave a message.  However, if you are feeling well and you are not experiencing any problems, there is no need to return our call.  We will assume that you have returned to your regular daily activities without incident.  If any biopsies were taken you will be contacted by phone or by letter within the next 1-3 weeks.  Please call us at [phone] if you have not heard about the biopsies in 3 weeks.    SIGNATURES/CONFIDENTIALITY: You and/or your care partner have signed paperwork which will be entered into your electronic medical record.  These signatures attest to the fact that that the information above on your After Visit Summary has been reviewed and is understood.  Full responsibility of the confidentiality of this discharge information lies with you and/or your care-partner.YOU HAD AN ENDOSCOPIC PROCEDURE TODAY AT [facility]:   Refer to the procedure report that was given to you for any specific questions about what was found during the examination.  If the procedure report does not answer your questions, please call your gastroenterologist to clarify.  If you requested that your care partner not be given the details of your procedure findings, then the procedure report has been included in a sealed envelope for you to review at your convenience later.  YOU SHOULD EXPECT: Some feelings of bloating in the abdomen. Passage of more gas than usual.  Walking can help get rid of the air that was put into your GI tract during the procedure and reduce the bloating. If you had a lower endoscopy (such as a colonoscopy or flexible sigmoidoscopy) you may notice spotting of blood  in your stool or on the toilet paper. If you underwent a bowel prep for your procedure, you may not have a normal bowel movement for a few days.  Please Note:  You might notice some irritation and congestion in your nose or some drainage.  This is from the oxygen used during your procedure.  There is no need for concern and it should clear up in a day or so.  SYMPTOMS TO REPORT IMMEDIATELY:   Following lower endoscopy (colonoscopy or flexible sigmoidoscopy):  Excessive amounts of blood in the stool  Significant tenderness or worsening of abdominal pains  Swelling of the abdomen that is new, acute  Fever of 100F or higher   Following upper endoscopy (EGD)  Vomiting of blood or coffee ground material  New chest pain or pain under the shoulder blades  Painful or persistently difficult swallowing  New shortness of breath  Fever of 100F or higher  Black, tarry-looking stools  For urgent or emergent issues, a gastroenterologist can be reached at any hour by calling [phone].   DIET: Your first meal following the procedure should be a small meal and then it is ok to progress to your normal diet. Heavy or fried foods are harder to digest and may make you feel nauseous or bloated.  Likewise, meals heavy in dairy and vegetables can increase bloating.  Drink plenty of fluids but you should avoid alcoholic beverages for 24 hours.  ACTIVITY:  You should plan to take it easy for the rest of today and you should NOT DRIVE or use heavy machinery until tomorrow (because of the sedation medicines used during the test).    FOLLOW UP: Our staff will call the number listed on your records the next business day following your procedure to check on you and address any questions or concerns that you may have regarding the information given to you following your procedure. If we do not reach you, we will leave a message.  However, if you are feeling well and you are not experiencing any problems, there  is no need to return our call.  We will assume that you have returned to your regular daily activities without incident.  If any biopsies were taken you will be contacted by phone or by letter within the next 1-3 weeks.  Please call us at [phone] if you have not heard about the biopsies in 3 weeks.  SIGNATURES/CONFIDENTIALITY: You and/or your care partner have signed paperwork which will be entered into your electronic medical record.  These signatures attest to the fact that that the information above on your After Visit Summary has been reviewed and is understood.  Full responsibility of the confidentiality of this discharge information lies with you and/or your care-partner.YOU HAD AN ENDOSCOPIC PROCEDURE TODAY AT [facility]:   Refer to the procedure report that was given to you for any specific questions about what was found during the examination.  If the procedure report does not answer your questions, please call your gastroenterologist to clarify.  If you requested that your care partner not be given the details of your procedure findings, then the procedure report has been included in a sealed envelope for you to review at your convenience later.  YOU SHOULD EXPECT: Some feelings of bloating in the abdomen. Passage of more gas than usual.  Walking can help get rid of the air that was put into your GI tract during the procedure and reduce the bloating. If you had a lower endoscopy (such as a colonoscopy or flexible sigmoidoscopy) you may notice spotting of blood in your stool or on the toilet paper. If you underwent a bowel prep for your procedure, you may not have a normal bowel movement for a few days.  Please Note:  You might notice some irritation and congestion in your nose or some drainage.  This is from the oxygen used during your procedure.  There is no need for concern and it should clear up in a day or so.  SYMPTOMS TO REPORT IMMEDIATELY:   Following lower  endoscopy (colonoscopy or flexible sigmoidoscopy):  Excessive amounts of blood in the stool  Significant tenderness or worsening of abdominal pains  Swelling of the abdomen that is new, acute  Fever of 100F or higher   For urgent or emergent issues, a gastroenterologist can be reached at any hour by calling [phone].   DIET: Your first meal following the procedure should be a small meal and then it is ok to progress to your normal diet. Heavy or fried foods are harder to digest and may make you feel nauseous or bloated.  Likewise, meals heavy in dairy and vegetables can increase bloating.  Drink plenty of fluids but you should avoid alcoholic beverages for 24 hours.  ACTIVITY:  You should plan to take it easy for the rest of today and you should NOT DRIVE or use heavy machinery until tomorrow (because of the sedation medicines used during the test).    FOLLOW UP: Our staff will call the number listed on your records the next business day following your procedure to check on you and address any questions or concerns that you may have regarding the information given to you following your procedure. If we do not reach you, we will leave a message.  However, if you are feeling well and you are not experiencing any problems, there is no need to return our call.  We will assume that you have returned to your regular daily activities without incident.  If any biopsies were taken you will be contacted by phone or by letter within the next 1-3 weeks.  Please call us at [phone] if you have not heard about the biopsies in 3 weeks.    SIGNATURES/CONFIDENTIALITY: You and/or your care partner have signed paperwork which will be entered into your electronic medical record.  These signatures attest to the fact that  that the information above on your After Visit Summary has been reviewed and is understood.  Full responsibility of the confidentiality of this discharge information lies with you  and/or your care-partner.  Polyp information given.  Hemorrhoid information given.  No aspirin, naproxen, advil, ibuprofen or any non-steroidal anti inflammatory meds for 5 days.

## 2015-08-10 NOTE — Op Note (Signed)
[place] Patient Name: Jonathon [name] Procedure Date: 08/10/2015 11:04 AM MRN: [id number] Endoscopist: Mallie Mussel L. Loletha Carrow , MD Age: 70 Referring MD:  Date of Birth: 1945/03/31 Gender: Male Account #: [id number] Procedure:                Colonoscopy Indications:              Screening in patient at increased risk: Colorectal                            cancer in mother 28 or older, This is the patient's                            first colonoscopy Medicines:                Monitored Anesthesia Care Procedure:                Pre-Anesthesia Assessment:                           - Prior to the procedure, a History and Physical                            was performed, and patient medications and                            allergies were reviewed. The patient's tolerance of                            previous anesthesia was also reviewed. The risks                            and benefits of the procedure and the sedation                            options and risks were discussed with the patient.                            All questions were answered, and informed consent                            was obtained. Prior Anticoagulants: The patient has                            taken aspirin, last dose was 1 day prior to                            procedure. ASA Grade Assessment: III - A patient                            with severe systemic disease. After reviewing the                            risks and benefits, the patient was deemed in  satisfactory condition to undergo the procedure.                           After obtaining informed consent, the colonoscope                            was passed under direct vision. Throughout the                            procedure, the patient's blood pressure, pulse, and                            oxygen saturations were monitored continuously. The                            Model CF-HQ190L [id number]) scope  was introduced                            through the anus and advanced to the the cecum,                            identified by appendiceal orifice and ileocecal                            valve. The colonoscopy was performed without                            difficulty. The patient tolerated the procedure                            well. The quality of the bowel preparation was                            excellent. The ileocecal valve, appendiceal                            orifice, and rectum were photographed. The bowel                            preparation used was Miralax. Scope In: 11:12:34 AM Scope Out: 11:34:34 AM Scope Withdrawal Time: 0 hours 19 minutes 7 seconds  Total Procedure Duration: 0 hours 22 minutes 0 seconds  Findings:                 The perianal and digital rectal examinations were                            normal.                           A 6 mm polyp was found in the mid sigmoid colon.                            The polyp was semi-pedunculated. The polyp was  removed with a hot snare. Resection and retrieval                            were complete.                           Internal hemorrhoids were found during                            retroflexion. The hemorrhoids were small and Grade                            I (internal hemorrhoids that do not prolapse).                           The exam was otherwise without abnormality. Complications:            No immediate complications. Estimated Blood Loss:     Estimated blood loss: none. Impression:               - One 6 mm polyp in the mid sigmoid colon, removed                            with a hot snare. Resected and retrieved.                           - Internal hemorrhoids.                           - The examination was otherwise normal. Recommendation:           - Patient has a contact number available for                            emergencies. The signs and symptoms of  potential                            delayed complications were discussed with the                            patient. Return to normal activities tomorrow.                            Written discharge instructions were provided to the                            patient.                           - Resume previous diet.                           - Continue present medications.                           - No aspirin, ibuprofen, naproxen, or other  non-steroidal anti-inflammatory drugs for 5 days                            after polyp removal.                           - Await pathology results.                           - Repeat colonoscopy is recommended for                            surveillance. The colonoscopy date will be                            determined after pathology results from today's                            exam become available for review. Henry L. Loletha Carrow, MD 08/10/2015 11:38:13 AM This report has been signed electronically.

## 2015-08-11 ENCOUNTER — Telehealth: Payer: Self-pay | Admitting: *Deleted

## 2015-08-11 NOTE — Telephone Encounter (Signed)
  Follow up Call-  Call back number 08/10/2015  Post procedure Call Back phone  # [phone]  Permission to leave phone message Yes     Patient questions:  Do you have a fever, pain , or abdominal swelling? No. Pain Score  0 *  Have you tolerated food without any problems? Yes.    Have you been able to return to your normal activities? Yes.    Do you have any questions about your discharge instructions: Diet   No. Medications  No. Follow up visit  No.  Do you have questions or concerns about your Care? No.  Actions: * If pain score is 4 or above: No action needed, pain <4.

## 2015-08-13 ENCOUNTER — Encounter: Payer: Self-pay | Admitting: Gastroenterology

## 2015-08-19 ENCOUNTER — Encounter: Payer: Self-pay | Admitting: Internal Medicine

## 2015-08-19 DIAGNOSIS — I2510 Atherosclerotic heart disease of native coronary artery without angina pectoris: Secondary | ICD-10-CM

## 2015-08-29 ENCOUNTER — Other Ambulatory Visit: Payer: Self-pay | Admitting: Internal Medicine

## 2015-09-08 NOTE — Progress Notes (Signed)
HPI The patient returns for followup after bypass surgery and subsequent circ stenting.  He had an occluded native CFX treated with a Promus premier DES in May of 2014.  Since he was last seen in the clinic he's done very well.  He takes care of some grandchildren and he takes care of several yards.  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 The patient denies any new symptoms such as chest discomfort, neck or arm discomfort. There has been no new shortness of breath, PND or orthopnea. There have been no reported palpitations, presyncope or syncope.  He remains quite active.   Allergies  Allergen Reactions  . Dilaudid [Hydromorphone Hcl] Hypertension    Back and forth with hyper and hypotension    Current Outpatient Prescriptions  Medication Sig Dispense Refill  . aspirin EC 81 MG tablet Take 81 mg by mouth at bedtime.    . CRESTOR 40 MG tablet TAKE 1 TABLET(40 MG) BY MOUTH DAILY 90 tablet 3  . levothyroxine (SYNTHROID, LEVOTHROID) 50 MCG tablet TAKE 1 TABLET BY MOUTH DAILY BEFORE BREAKFAST 90 tablet 1  . metoprolol tartrate (LOPRESSOR) 25 MG tablet TAKE 1 TABLET(25 MG) BY MOUTH TWICE DAILY 60 tablet 6  . nitroGLYCERIN (NITROSTAT) 0.4 MG SL tablet Place 1 tablet (0.4 mg total) under the tongue every 5 (five) minutes as needed for chest pain. 25 tablet 3   No current facility-administered medications for this visit.     Past Medical History:  Diagnosis Date  . Atrial fibrillation ([place])    post op after CABG; tx with amiodarone  . Coronary artery disease    a. s/p NSTEMI 12/12 => CABG;  b. inferior STEMI 5/14: 3/3 grafts patent, native CFX 100% => Promus DES   . HLD (hyperlipidemia)   . HTN (hypertension)   . Lumbar disc disease 03/19/2011  . Psoriasis 03/22/2011  . S/P CABG (coronary artery bypass graft)    12/12 with Dr. Cyndia Bent: LIMA-LAD, SVG-RI, SVG-OM  . Sciatica 03/19/2011    Past Surgical History:  Procedure Laterality Date  . CORONARY ARTERY BYPASS GRAFT   01/19/2011   Procedure: CORONARY ARTERY BYPASS GRAFTING (CABG);  Surgeon: Gaye Pollack, MD;  Location: [place];  Service: Open Heart Surgery;  Laterality: N/A;  . LEFT HEART CATH N/A 07/07/2012   Procedure: LEFT HEART CATH;  Surgeon: Peter M Martinique, MD;  Location[facility];  Service: Cardiovascular;  Laterality: N/A;  . LEFT HEART CATHETERIZATION WITH CORONARY ANGIOGRAM N/A 01/13/2011   Procedure: LEFT HEART CATHETERIZATION WITH CORONARY ANGIOGRAM;  Surgeon: Peter M Martinique, MD;  Location: [facility] CATH LAB;  Service: Cardiovascular;  Laterality: N/A;  . PERCUTANEOUS CORONARY STENT INTERVENTION (PCI-S)  07/07/2012   Procedure: PERCUTANEOUS CORONARY STENT INTERVENTION (PCI-S);  Surgeon: Peter M Martinique, MD;  Location: [facility] CATH LAB;  Service: Cardiovascular;;  . TONSILLECTOMY      ROS:  As stated in the HPI and negative for all other systems.  PHYSICAL EXAM BP 132/74   Pulse 66   Ht 5\' 10"  (1.778 m)   Wt 186 lb (84.4 kg)   BMI 26.69 kg/m  GENERAL:  Well appearing NECK:  No jugular venous distention, waveform within normal limits, carotid upstroke brisk and symmetric, no bruits, no thyromegaly LYMPHATICS:  No cervical, inguinal adenopathy LUNGS:  Clear to auscultation bilaterally BACK:  No CVA tenderness CHEST:  Well healed sternotomy scar. HEART:  PMI not displaced or sustained,S1 and S2 within normal limits, no S3, no S4,  no clicks, no rubs, no murmurs ABD:  Flat, positive bowel sounds normal in frequency in pitch, no bruits, no rebound, no guarding, no midline pulsatile mass, no hepatomegaly, no splenomegaly EXT:  2 plus pulses throughout, no edema, no cyanosis no clubbing  EKG:    Sinus rhythm, rate 66, axis within normal limits, intervals within normal limits, no acute ST-T wave changes.  09/10/2015   Lab Results  Component Value Date   CHOL 117 06/30/2015   TRIG 88.0 06/30/2015   HDL 34.60 (L) 06/30/2015   LDLCALC 65 06/30/2015    ASSESSMENT AND PLAN   CAD -  The patient has no  new sypmtoms.  No further cardiovascular testing is indicated.  We will continue with aggressive risk reduction.     Hypertension - The blood pressure is at target. No change in medications is indicated. We will continue with therapeutic lifestyle changes (TLC).   Hyperlipidemia -  I reviewed the lipid profile.  Hisl HDL was 65.  He tolerates the Crestor and he will continue this med at this dose.

## 2015-09-10 ENCOUNTER — Encounter: Payer: Self-pay | Admitting: Cardiology

## 2015-09-10 ENCOUNTER — Ambulatory Visit (INDEPENDENT_AMBULATORY_CARE_PROVIDER_SITE_OTHER): Payer: Commercial Managed Care - HMO | Admitting: Cardiology

## 2015-09-10 VITALS — BP 132/74 | HR 66 | Ht 70.0 in | Wt 186.0 lb

## 2015-09-10 DIAGNOSIS — I10 Essential (primary) hypertension: Secondary | ICD-10-CM

## 2015-09-10 DIAGNOSIS — I2510 Atherosclerotic heart disease of native coronary artery without angina pectoris: Secondary | ICD-10-CM

## 2015-09-10 NOTE — Patient Instructions (Signed)
Medication Instructions:  Continue current medcations  Labwork: NONE  Testing/Procedures: NONE  Follow-Up: Your physician wants you to follow-up in: 1 Year. You will receive a reminder letter in the mail two months in advance. If you don't receive a letter, please call our office to schedule the follow-up appointment.   Any Other Special Instructions Will Be Listed Below (If Applicable).   If you need a refill on your cardiac medications before your next appointment, please call your pharmacy.

## 2015-09-17 ENCOUNTER — Other Ambulatory Visit: Payer: Self-pay | Admitting: Cardiology

## 2015-09-18 NOTE — Telephone Encounter (Signed)
Rx(s) sent to pharmacy electronically.  

## 2015-11-02 ENCOUNTER — Other Ambulatory Visit: Payer: Self-pay | Admitting: *Deleted

## 2015-11-02 MED ORDER — METOPROLOL TARTRATE 25 MG PO TABS: 25.0000 mg | ORAL_TABLET | Freq: Two times a day (BID) | ORAL | 6 refills | Status: DC

## 2015-11-02 NOTE — Telephone Encounter (Signed)
Rx has been sent to the pharmacy electronically. ° °

## 2016-01-26 ENCOUNTER — Encounter: Payer: Self-pay | Admitting: Internal Medicine

## 2016-01-26 DIAGNOSIS — N400 Enlarged prostate without lower urinary tract symptoms: Secondary | ICD-10-CM

## 2016-02-05 DIAGNOSIS — R972 Elevated prostate specific antigen [PSA]: Secondary | ICD-10-CM | POA: Diagnosis not present

## 2016-02-21 ENCOUNTER — Other Ambulatory Visit: Payer: Self-pay | Admitting: Internal Medicine

## 2016-03-28 DIAGNOSIS — R972 Elevated prostate specific antigen [PSA]: Secondary | ICD-10-CM | POA: Diagnosis not present

## 2016-05-29 ENCOUNTER — Other Ambulatory Visit: Payer: Self-pay | Admitting: Cardiology

## 2016-05-30 ENCOUNTER — Other Ambulatory Visit: Payer: Self-pay | Admitting: Cardiology

## 2016-06-06 ENCOUNTER — Other Ambulatory Visit: Payer: Self-pay | Admitting: Cardiology

## 2016-06-09 ENCOUNTER — Other Ambulatory Visit: Payer: Self-pay | Admitting: *Deleted

## 2016-06-09 MED ORDER — METOPROLOL TARTRATE 25 MG PO TABS: ORAL_TABLET | ORAL | 0 refills | Status: DC

## 2016-06-22 ENCOUNTER — Encounter: Payer: Self-pay | Admitting: Internal Medicine

## 2016-06-24 ENCOUNTER — Other Ambulatory Visit (INDEPENDENT_AMBULATORY_CARE_PROVIDER_SITE_OTHER): Payer: Commercial Managed Care - HMO

## 2016-06-24 DIAGNOSIS — Z0001 Encounter for general adult medical examination with abnormal findings: Secondary | ICD-10-CM

## 2016-06-24 DIAGNOSIS — R7302 Impaired glucose tolerance (oral): Secondary | ICD-10-CM | POA: Diagnosis not present

## 2016-06-24 LAB — URINALYSIS, ROUTINE W REFLEX MICROSCOPIC
Bilirubin Urine: NEGATIVE
Leukocytes, UA: NEGATIVE
Nitrite: NEGATIVE
TOTAL PROTEIN, URINE-UPE24: NEGATIVE
UROBILINOGEN UA: 0.2 (ref 0.0–1.0)
Urine Glucose: 250 — AB
WBC UA: NONE SEEN (ref 0–?)
pH: 5.5 (ref 5.0–8.0)

## 2016-06-24 LAB — LIPID PANEL
CHOL/HDL RATIO: 3
Cholesterol: 98 mg/dL (ref 0–200)
HDL: 34.4 mg/dL — AB (ref >39.00)
LDL Cholesterol: 47 mg/dL (ref 0–99)
NONHDL: 63.48
Triglycerides: 82 mg/dL (ref 0.0–149.0)
VLDL: 16.4 mg/dL (ref 0.0–40.0)

## 2016-06-24 LAB — BASIC METABOLIC PANEL
BUN: 16 mg/dL (ref 6–23)
CHLORIDE: 106 meq/L (ref 96–112)
CO2: 28 meq/L (ref 19–32)
Calcium: 9 mg/dL (ref 8.4–10.5)
Creatinine, Ser: 1.19 mg/dL (ref 0.40–1.50)
GFR: 64.1 mL/min (ref >60.00)
Glucose, Bld: 107 mg/dL — ABNORMAL HIGH (ref 70–99)
POTASSIUM: 4.6 meq/L (ref 3.5–5.1)
SODIUM: 141 meq/L (ref 135–145)

## 2016-06-24 LAB — CBC WITH DIFFERENTIAL/PLATELET
BASOS ABS: 0 10*3/uL (ref 0.0–0.1)
BASOS PCT: 0.5 % (ref 0.0–3.0)
EOS ABS: 0.2 10*3/uL (ref 0.0–0.7)
Eosinophils Relative: 2.2 % (ref 0.0–5.0)
HEMATOCRIT: 45.2 % (ref 39.0–52.0)
Hemoglobin: 15.5 g/dL (ref 13.0–17.0)
LYMPHS ABS: 2 10*3/uL (ref 0.7–4.0)
Lymphocytes Relative: 26.1 % (ref 12.0–46.0)
MCHC: 34.3 g/dL (ref 30.0–36.0)
MCV: 90 fl (ref 78.0–100.0)
Monocytes Absolute: 0.7 10*3/uL (ref 0.1–1.0)
Monocytes Relative: 8.7 % (ref 3.0–12.0)
NEUTROS ABS: 4.9 10*3/uL (ref 1.4–7.7)
NEUTROS PCT: 62.5 % (ref 43.0–77.0)
PLATELETS: 176 10*3/uL (ref 150.0–400.0)
RBC: 5.02 Mil/uL (ref 4.22–5.81)
RDW: 13.1 % (ref 11.5–15.5)
WBC: 7.8 10*3/uL (ref 4.0–10.5)

## 2016-06-24 LAB — HEPATIC FUNCTION PANEL
ALK PHOS: 45 U/L (ref 39–117)
ALT: 21 U/L (ref 0–53)
AST: 31 U/L (ref 0–37)
Albumin: 4 g/dL (ref 3.5–5.2)
BILIRUBIN DIRECT: 0.1 mg/dL (ref 0.0–0.3)
BILIRUBIN TOTAL: 0.6 mg/dL (ref 0.2–1.2)
TOTAL PROTEIN: 6.7 g/dL (ref 6.0–8.3)

## 2016-06-24 LAB — HEMOGLOBIN A1C: HEMOGLOBIN A1C: 5.8 % (ref 4.6–6.5)

## 2016-06-24 LAB — TSH: TSH: 3.32 u[IU]/mL (ref 0.35–4.50)

## 2016-06-24 LAB — PSA: PSA: 2.01 ng/mL (ref 0.10–4.00)

## 2016-06-30 ENCOUNTER — Other Ambulatory Visit: Payer: Self-pay | Admitting: Internal Medicine

## 2016-06-30 ENCOUNTER — Ambulatory Visit (INDEPENDENT_AMBULATORY_CARE_PROVIDER_SITE_OTHER): Payer: Medicare HMO | Admitting: Internal Medicine

## 2016-06-30 ENCOUNTER — Encounter: Payer: Self-pay | Admitting: Internal Medicine

## 2016-06-30 VITALS — BP 114/76 | HR 60 | Ht 70.0 in | Wt 193.0 lb

## 2016-06-30 DIAGNOSIS — E785 Hyperlipidemia, unspecified: Secondary | ICD-10-CM

## 2016-06-30 DIAGNOSIS — Z0000 Encounter for general adult medical examination without abnormal findings: Secondary | ICD-10-CM

## 2016-06-30 DIAGNOSIS — R7302 Impaired glucose tolerance (oral): Secondary | ICD-10-CM | POA: Diagnosis not present

## 2016-06-30 DIAGNOSIS — I10 Essential (primary) hypertension: Secondary | ICD-10-CM

## 2016-06-30 DIAGNOSIS — I2510 Atherosclerotic heart disease of native coronary artery without angina pectoris: Secondary | ICD-10-CM

## 2016-06-30 MED ORDER — LEVOTHYROXINE SODIUM 50 MCG PO TABS: ORAL_TABLET | ORAL | 3 refills | Status: DC

## 2016-06-30 MED ORDER — ROSUVASTATIN CALCIUM 40 MG PO TABS: ORAL_TABLET | ORAL | 3 refills | Status: DC

## 2016-06-30 MED ORDER — NITROGLYCERIN 0.4 MG SL SUBL: 0.4000 mg | SUBLINGUAL_TABLET | SUBLINGUAL | 3 refills | Status: DC | PRN

## 2016-06-30 MED ORDER — METOPROLOL TARTRATE 25 MG PO TABS: ORAL_TABLET | ORAL | 3 refills | Status: DC

## 2016-06-30 NOTE — Progress Notes (Signed)
Subjective:    Patient ID: Jonathon [name], male    DOB: Jul 12, 1945, 71 y.o.   MRN: [id number]  HPI  Here for wellness and f/u;  Overall doing ok;  Pt denies Chest pain, worsening SOB, DOE, wheezing, orthopnea, PND, worsening LE edema, palpitations, dizziness or syncope.  Pt denies neurological change such as new headache, facial or extremity weakness.  Pt denies polydipsia, polyuria, or low sugar symptoms. Pt states overall good compliance with treatment and medications, good tolerability, and has been trying to follow appropriate diet.  Pt denies worsening depressive symptoms, suicidal ideation or panic. No fever, night sweats, wt loss, loss of appetite, or other constitutional symptoms.  Pt states good ability with ADL's, has low fall risk, home safety reviewed and adequate, no other significant changes in hearing or vision, and only occasionally active with exercise. Plans to do more regular aerobic exercise such as walking but has been thinking just staying busy recently may have been enough.  No further hx  Has not used NTG in years.Needs card referral for insurance purpose for july Wt Readings from Last 3 Encounters:  06/30/16 193 lb (87.5 kg)  09/10/15 186 lb (84.4 kg)  08/10/15 187 lb (84.8 kg)   Past Medical History:  Diagnosis Date  . Atrial fibrillation ([place])    post op after CABG; tx with amiodarone  . Coronary artery disease    a. s/p NSTEMI 12/12 => CABG;  b. inferior STEMI 5/14: 3/3 grafts patent, native CFX 100% => Promus DES   . HLD (hyperlipidemia)   . HTN (hypertension)   . Lumbar disc disease 03/19/2011  . Psoriasis 03/22/2011  . S/P CABG (coronary artery bypass graft)    12/12 with Dr. Cyndia Bent: LIMA-LAD, SVG-RI, SVG-OM  . Sciatica 03/19/2011   Past Surgical History:  Procedure Laterality Date  . CORONARY ARTERY BYPASS GRAFT  01/19/2011   Procedure: CORONARY ARTERY BYPASS GRAFTING (CABG);  Surgeon: Gaye Pollack, MD;  Location: [facility];  Service: Open Heart Surgery;   Laterality: N/A;  . LEFT HEART CATH N/A 07/07/2012   Procedure: LEFT HEART CATH;  Surgeon: Peter M Martinique, MD;  Location: [facility] CATH LAB;  Service: Cardiovascular;  Laterality: N/A;  . LEFT HEART CATHETERIZATION WITH CORONARY ANGIOGRAM N/A 01/13/2011   Procedure: LEFT HEART CATHETERIZATION WITH CORONARY ANGIOGRAM;  Surgeon: Peter M Martinique, MD;  Location: [facility] CATH LAB;  Service: Cardiovascular;  Laterality: N/A;  . PERCUTANEOUS CORONARY STENT INTERVENTION (PCI-S)  07/07/2012   Procedure: PERCUTANEOUS CORONARY STENT INTERVENTION (PCI-S);  Surgeon: Peter M Martinique, MD;  Location: [facility] CATH LAB;  Service: Cardiovascular;;  . TONSILLECTOMY      reports that he quit smoking about 48 years ago. His smoking use included Cigarettes. He has never used smokeless tobacco. He reports that he does not drink alcohol or use drugs. family history includes Cancer in his mother; Colon cancer in his mother; Heart disease in his father and mother; Hyperlipidemia in his father. Allergies  Allergen Reactions  . Dilaudid [Hydromorphone Hcl] Hypertension    Back and forth with hyper and hypotension   Current Outpatient Prescriptions on File Prior to Visit  Medication Sig Dispense Refill  . aspirin EC 81 MG tablet Take 81 mg by mouth at bedtime.     No current facility-administered medications on file prior to visit.    Review of Systems Constitutional: Negative for other unusual diaphoresis, sweats, appetite or weight changes HENT: Negative for other worsening hearing loss, ear pain, facial swelling, mouth sores or  neck stiffness.   Eyes: Negative for other worsening pain, redness or other visual disturbance.  Respiratory: Negative for other stridor or swelling Cardiovascular: Negative for other palpitations or other chest pain  Gastrointestinal: Negative for worsening diarrhea or loose stools, blood in stool, distention or other pain Genitourinary: Negative for hematuria, flank pain or other change in urine volume.    Musculoskeletal: Negative for myalgias or other joint swelling.  Skin: Negative for other color change, or other wound or worsening drainage.  Neurological: Negative for other syncope or numbness. Hematological: Negative for other adenopathy or swelling Psychiatric/Behavioral: Negative for hallucinations, other worsening agitation, SI, self-injury, or new decreased concentration All other system neg per pt    Objective:   Physical Exam BP 114/76   Pulse 60   Ht 5\' 10"  (1.778 m)   Wt 193 lb (87.5 kg)   SpO2 100%   BMI 27.69 kg/m  VS noted, not ill appearing, mild overwt Constitutional: Pt is oriented to person, place, and time. Appears well-developed and well-nourished, in no significant distress and comfortable Head: Normocephalic and atraumatic  Eyes: Conjunctivae and EOM are normal. Pupils are equal, round, and reactive to light Right Ear: External ear normal without discharge Left Ear: External ear normal without discharge Nose: Nose without discharge or deformity Mouth/Throat: Oropharynx is without other ulcerations and moist  Neck: Normal range of motion. Neck supple. No JVD present. No tracheal deviation present or significant neck LA or mass Cardiovascular: Normal rate, regular rhythm, normal heart sounds and intact distal pulses.   Pulmonary/Chest: WOB normal and breath sounds without rales or wheezing  Abdominal: Soft. Bowel sounds are normal. NT. No HSM  Musculoskeletal: Normal range of motion. Exhibits no edema Lymphadenopathy: Has no other cervical adenopathy.  Neurological: Pt is alert and oriented to person, place, and time. Pt has normal reflexes. No cranial nerve deficit. Motor grossly intact, Gait intact Skin: Skin is warm and dry. No rash noted or new ulcerations Psychiatric:  Has normal mood and affect. Behavior is normal without agitation No other exam findings    Assessment & Plan:

## 2016-06-30 NOTE — Assessment & Plan Note (Signed)
stable overall by history and exam, recent data reviewed with pt, and pt to continue medical treatment as before,  to f/u any worsening symptoms or concerns Lab Results  Component Value Date   LDLCALC 47 06/24/2016

## 2016-06-30 NOTE — Assessment & Plan Note (Signed)
Has some evidence for glucose in urine, but a1c normal, cont to monitor and work on wt loss of 5-10 lbs, exercise daily 30 min walking, and DM diet

## 2016-06-30 NOTE — Patient Instructions (Signed)
Please continue all other medications as before, and refills have been done if requested.  Please have the pharmacy call with any other refills you may need.  Please continue your efforts at being more active, low cholesterol diet, and weight control.  You are otherwise up to date with prevention measures today.  Please keep your appointments with your specialists as you may have planned  You will be contacted regarding the referral for: Cardiology for July  Please return in 1 year for your yearly visit, or sooner if needed, with Lab testing done 3-5 days before

## 2016-06-30 NOTE — Assessment & Plan Note (Signed)
stable overall by history and exam, recent data reviewed with pt, and pt to continue medical treatment as before,  to f/u any worsening symptoms or concerns BP Readings from Last 3 Encounters:  06/30/16 114/76  09/10/15 132/74  08/10/15 102/66

## 2016-06-30 NOTE — Assessment & Plan Note (Signed)
Stable, for card referral as requested

## 2016-06-30 NOTE — Assessment & Plan Note (Signed)

## 2016-08-14 NOTE — Progress Notes (Signed)
HPI The patient returns for followup after bypass surgery and subsequent circ stenting.  He had an occluded native CFX treated with a Promus premier DES in May of 2014.  Since he was last seen in the clinic he has done well.  He works in his woodshop.  He exercises by walking.  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  The patient denies any new symptoms such as chest discomfort, neck or arm discomfort. There has been no new shortness of breath, PND or orthopnea. There have been no reported palpitations, presyncope or syncope.  Allergies  Allergen Reactions  . Dilaudid [Hydromorphone Hcl] Hypertension    Back and forth with hyper and hypotension    Current Outpatient Prescriptions  Medication Sig Dispense Refill  . aspirin EC 81 MG tablet Take 81 mg by mouth at bedtime.    Marland Kitchen levothyroxine (SYNTHROID, LEVOTHROID) 50 MCG tablet TAKE 1 TABLET BY MOUTH DAILY BEFORE BREAKFAST 90 tablet 3  . metoprolol tartrate (LOPRESSOR) 25 MG tablet TAKE 1 TABLET(25 MG) BY MOUTH TWICE DAILY. 180 tablet 3  . nitroGLYCERIN (NITROSTAT) 0.4 MG SL tablet Place 1 tablet (0.4 mg total) under the tongue every 5 (five) minutes as needed for chest pain. 25 tablet 3  . rosuvastatin (CRESTOR) 40 MG tablet TAKE 1 TABLET(40 MG) BY MOUTH DAILY 90 tablet 3   No current facility-administered medications for this visit.     Past Medical History:  Diagnosis Date  . Atrial fibrillation ([place])    post op after CABG; tx with amiodarone  . Coronary artery disease    a. s/p NSTEMI 12/12 => CABG;  b. inferior STEMI 5/14: 3/3 grafts patent, native CFX 100% => Promus DES   . HLD (hyperlipidemia)   . HTN (hypertension)   . Lumbar disc disease 03/19/2011  . Psoriasis 03/22/2011  . S/P CABG (coronary artery bypass graft)    12/12 with Dr. Cyndia Bent: LIMA-LAD, SVG-RI, SVG-OM  . Sciatica 03/19/2011    Past Surgical History:  Procedure Laterality Date  . CORONARY ARTERY BYPASS GRAFT  01/19/2011   Procedure: CORONARY ARTERY  BYPASS GRAFTING (CABG);  Surgeon: Gaye Pollack, MD;  Location: [facility];  Service: Open Heart Surgery;  Laterality: N/A;  . LEFT HEART CATH N/A 07/07/2012   Procedure: LEFT HEART CATH;  Surgeon: Peter M Martinique, MD;  Location: [facility] CATH LAB;  Service: Cardiovascular;  Laterality: N/A;  . LEFT HEART CATHETERIZATION WITH CORONARY ANGIOGRAM N/A 01/13/2011   Procedure: LEFT HEART CATHETERIZATION WITH CORONARY ANGIOGRAM;  Surgeon: Peter M Martinique, MD;  Location: [facility] CATH LAB;  Service: Cardiovascular;  Laterality: N/A;  . PERCUTANEOUS CORONARY STENT INTERVENTION (PCI-S)  07/07/2012   Procedure: PERCUTANEOUS CORONARY STENT INTERVENTION (PCI-S);  Surgeon: Peter M Martinique, MD;  Location: [facility] CATH LAB;  Service: Cardiovascular;;  . TONSILLECTOMY      ROS:  As stated in the HPI and negative for all other systems.  PHYSICAL EXAM BP 136/78 (BP Location: Left Arm, Patient Position: Sitting, Cuff Size: Normal)   Pulse (!) 55   Ht 5\' 10"  (1.778 m)   Wt 193 lb (87.5 kg)   BMI 27.69 kg/m   GENERAL:  Well appearing NECK:  No jugular venous distention, waveform within normal limits, carotid upstroke brisk and symmetric, no bruits, no thyromegaly LUNGS:  Clear to auscultation bilaterally CHEST:  Well healed sternotomy scar. HEART:  PMI not displaced or sustained,S1 and S2 within normal limits, no S3, no S4, no clicks, no rubs, no murmurs  ABD:  Flat, positive bowel sounds normal in frequency in pitch, no bruits, no rebound, no guarding, no midline pulsatile mass, no hepatomegaly, no splenomegaly EXT:  2 plus pulses throughout, no edema, no cyanosis no clubbing   EKG:    Sinus rhythm, rate 55, axis within normal limits, intervals within normal limits, no acute ST-T wave changes.  08/15/2016   Lab Results  Component Value Date   CHOL 98 06/24/2016   TRIG 82.0 06/24/2016   HDL 34.40 (L) 06/24/2016   LDLCALC 47 06/24/2016    ASSESSMENT AND PLAN   CAD -  The patient has no new sypmtoms.   I will bring the  patient back for a POET (Plain Old Exercise Test). This will allow me to screen for obstructive coronary disease, risk stratify and very importantly provide a prescription for exercise.  Hypertension - The blood pressure is at target. No change in medications is indicated. We will continue with therapeutic lifestyle changes (TLC).  Hyperlipidemia -  I reviewed the lipid profile.  Hisl HDL was 47.  He will continue the meds as listed.

## 2016-08-15 ENCOUNTER — Encounter: Payer: Self-pay | Admitting: Cardiology

## 2016-08-15 ENCOUNTER — Ambulatory Visit (INDEPENDENT_AMBULATORY_CARE_PROVIDER_SITE_OTHER): Payer: Medicare HMO | Admitting: Cardiology

## 2016-08-15 VITALS — BP 136/78 | HR 55 | Ht 70.0 in | Wt 193.0 lb

## 2016-08-15 DIAGNOSIS — I2510 Atherosclerotic heart disease of native coronary artery without angina pectoris: Secondary | ICD-10-CM | POA: Diagnosis not present

## 2016-08-15 NOTE — Patient Instructions (Addendum)
Your physician has requested that you have an exercise tolerance test. For further information please visit [email or web address]. Please also follow instruction sheet, as given.  You will receive your test results via mail, phone call, or by My chart.  Your physician wants you to follow-up in: 1 year or sooner if year. You will receive a reminder letter in the mail two months in advance. If you don't receive a letter, please call our office to schedule the follow-up appointment.  If you need a refill on your cardiac medications before your next appointment, please call your pharmacy.

## 2016-08-25 ENCOUNTER — Other Ambulatory Visit: Payer: Self-pay | Admitting: Internal Medicine

## 2016-08-30 ENCOUNTER — Telehealth (HOSPITAL_COMMUNITY): Payer: Self-pay

## 2016-08-30 NOTE — Telephone Encounter (Signed)
Encounter complete. 

## 2016-09-01 ENCOUNTER — Ambulatory Visit (HOSPITAL_COMMUNITY)
Admission: RE | Admit: 2016-09-01 | Discharge: 2016-09-01 | Disposition: A | Discharge status: Home / Self Care | Payer: Medicare HMO | Source: Ambulatory Visit | Attending: Cardiology | Admitting: Cardiology

## 2016-09-01 DIAGNOSIS — I2510 Atherosclerotic heart disease of native coronary artery without angina pectoris: Secondary | ICD-10-CM | POA: Diagnosis not present

## 2016-09-01 LAB — EXERCISE TOLERANCE TEST
CHL CUP MPHR: 150 {beats}/min
CHL RATE OF PERCEIVED EXERTION: 18
CSEPHR: 106 %
Estimated workload: 11.3 METS
Exercise duration (min): 9 min
Exercise duration (sec): 45 s
Peak HR: 160 {beats}/min
Rest HR: 85 {beats}/min

## 2016-09-21 DIAGNOSIS — R972 Elevated prostate specific antigen [PSA]: Secondary | ICD-10-CM | POA: Diagnosis not present

## 2016-09-28 DIAGNOSIS — R972 Elevated prostate specific antigen [PSA]: Secondary | ICD-10-CM | POA: Diagnosis not present

## 2016-09-28 DIAGNOSIS — N400 Enlarged prostate without lower urinary tract symptoms: Secondary | ICD-10-CM | POA: Diagnosis not present

## 2017-01-10 ENCOUNTER — Ambulatory Visit: Payer: Medicare HMO | Admitting: Internal Medicine

## 2017-01-10 ENCOUNTER — Encounter: Payer: Self-pay | Admitting: Internal Medicine

## 2017-01-10 DIAGNOSIS — B029 Zoster without complications: Secondary | ICD-10-CM

## 2017-01-10 MED ORDER — VALACYCLOVIR HCL 1 G PO TABS: 1000.0000 mg | ORAL_TABLET | Freq: Three times a day (TID) | ORAL | 0 refills | Status: DC

## 2017-01-10 NOTE — Assessment & Plan Note (Signed)
Rx for valtrex 1 g TID for 1 week. Does not require pain medication today. He is advised that it is safe to get flu vaccine today but likely will not be as effective and he elects to wait 2 weeks.

## 2017-01-10 NOTE — Patient Instructions (Signed)
We have sent in the valtrex for the rash.Take 1 pill three times per day for 1 week.   Come back for the flu shot in 1-2 weeks.

## 2017-01-10 NOTE — Progress Notes (Signed)
   Subjective:    Patient ID: Jonathon [name], male    DOB: 1945-02-20, 71 y.o.   MRN: [id number]  HPI The patient is a 71 YO man coming in for concern about shingles. Having a rash on the right side with circular blisters. No pain in the rash but some pain in the right shoulder. He had an episode of shingles about 8-9 years ago which was very similar but the left side. Started about 5-6 days ago and is stable since then. Taking aleve for pain at night time which is helping well. Denies fevers or chills.   Review of Systems  Constitutional: Negative.   Respiratory: Negative for cough, chest tightness and shortness of breath.   Cardiovascular: Negative for chest pain, palpitations and leg swelling.  Gastrointestinal: Negative for abdominal distention, abdominal pain, constipation, diarrhea, nausea and vomiting.  Musculoskeletal: Positive for arthralgias.  Skin: Positive for rash.      Objective:   Physical Exam  Constitutional: He is oriented to person, place, and time. He appears well-developed and well-nourished.  HENT:  Head: Normocephalic and atraumatic.  Eyes: EOM are normal.  Neck: Normal range of motion.  Cardiovascular: Normal rate and regular rhythm.  Pulmonary/Chest: Effort normal and breath sounds normal. No respiratory distress. He has no wheezes. He has no rales.  Abdominal: Soft. Bowel sounds are normal. He exhibits no distension. There is no tenderness. There is no rebound.  Musculoskeletal: He exhibits no edema.  Neurological: He is alert and oriented to person, place, and time. Coordination normal.  Skin: Skin is warm and dry. Rash noted.  Circular blistering rash right chest wall without tenderness, mild tenderness in the right shoulder.  Psychiatric: He has a normal mood and affect.   Vitals:   01/10/17 0829  BP: 140/90  Pulse: 60  Temp: 97.8 F (36.6 C)  TempSrc: Oral  SpO2: 99%  Weight: 194 lb (88 kg)  Height: 5\' 10"  (1.778 m)      Assessment & Plan:

## 2017-06-27 ENCOUNTER — Encounter: Payer: Self-pay | Admitting: Internal Medicine

## 2017-06-28 ENCOUNTER — Other Ambulatory Visit (INDEPENDENT_AMBULATORY_CARE_PROVIDER_SITE_OTHER): Payer: Medicare HMO

## 2017-06-28 DIAGNOSIS — Z0000 Encounter for general adult medical examination without abnormal findings: Secondary | ICD-10-CM

## 2017-06-28 DIAGNOSIS — R7302 Impaired glucose tolerance (oral): Secondary | ICD-10-CM

## 2017-06-28 LAB — CBC WITH DIFFERENTIAL/PLATELET
BASOS ABS: 0 10*3/uL (ref 0.0–0.1)
BASOS PCT: 0.6 % (ref 0.0–3.0)
Eosinophils Absolute: 0.2 10*3/uL (ref 0.0–0.7)
Eosinophils Relative: 2.3 % (ref 0.0–5.0)
HEMATOCRIT: 45.2 % (ref 39.0–52.0)
Hemoglobin: 15.5 g/dL (ref 13.0–17.0)
LYMPHS PCT: 31.2 % (ref 12.0–46.0)
Lymphs Abs: 2.1 10*3/uL (ref 0.7–4.0)
MCHC: 34.3 g/dL (ref 30.0–36.0)
MCV: 91.7 fl (ref 78.0–100.0)
MONOS PCT: 8.6 % (ref 3.0–12.0)
Monocytes Absolute: 0.6 10*3/uL (ref 0.1–1.0)
Neutro Abs: 3.8 10*3/uL (ref 1.4–7.7)
Neutrophils Relative %: 57.3 % (ref 43.0–77.0)
Platelets: 158 10*3/uL (ref 150.0–400.0)
RBC: 4.93 Mil/uL (ref 4.22–5.81)
RDW: 13.3 % (ref 11.5–15.5)
WBC: 6.7 10*3/uL (ref 4.0–10.5)

## 2017-06-28 LAB — BASIC METABOLIC PANEL WITH GFR
BUN: 11 mg/dL (ref 6–23)
CO2: 29 meq/L (ref 19–32)
Calcium: 9.2 mg/dL (ref 8.4–10.5)
Chloride: 105 meq/L (ref 96–112)
Creatinine, Ser: 1.15 mg/dL (ref 0.40–1.50)
GFR: 66.49 mL/min (ref >60.00)
Glucose, Bld: 105 mg/dL — ABNORMAL HIGH (ref 70–99)
Potassium: 4.6 meq/L (ref 3.5–5.1)
Sodium: 141 meq/L (ref 135–145)

## 2017-06-28 LAB — URINALYSIS, ROUTINE W REFLEX MICROSCOPIC
BILIRUBIN URINE: NEGATIVE
HGB URINE DIPSTICK: NEGATIVE
Ketones, ur: NEGATIVE
Leukocytes, UA: NEGATIVE
NITRITE: NEGATIVE
PH: 6 (ref 5.0–8.0)
RBC / HPF: NONE SEEN (ref 0–?)
Specific Gravity, Urine: 1.02 (ref 1.000–1.030)
TOTAL PROTEIN, URINE-UPE24: NEGATIVE
URINE GLUCOSE: 100 — AB
UROBILINOGEN UA: 0.2 (ref 0.0–1.0)

## 2017-06-28 LAB — HEMOGLOBIN A1C: Hgb A1c MFr Bld: 5.5 % (ref 4.6–6.5)

## 2017-06-28 LAB — LIPID PANEL
Cholesterol: 114 mg/dL (ref 0–200)
HDL: 43.3 mg/dL (ref >39.00)
LDL Cholesterol: 54 mg/dL (ref 0–99)
NonHDL: 70.45
Total CHOL/HDL Ratio: 3
Triglycerides: 84 mg/dL (ref 0.0–149.0)
VLDL: 16.8 mg/dL (ref 0.0–40.0)

## 2017-06-28 LAB — HEPATIC FUNCTION PANEL
ALT: 21 U/L (ref 0–53)
AST: 26 U/L (ref 0–37)
Albumin: 4 g/dL (ref 3.5–5.2)
Alkaline Phosphatase: 43 U/L (ref 39–117)
Bilirubin, Direct: 0.1 mg/dL (ref 0.0–0.3)
Total Bilirubin: 0.7 mg/dL (ref 0.2–1.2)
Total Protein: 6.9 g/dL (ref 6.0–8.3)

## 2017-06-28 LAB — PSA: PSA: 2.13 ng/mL (ref 0.10–4.00)

## 2017-06-28 LAB — TSH: TSH: 3.77 u[IU]/mL (ref 0.35–4.50)

## 2017-07-04 ENCOUNTER — Ambulatory Visit (INDEPENDENT_AMBULATORY_CARE_PROVIDER_SITE_OTHER): Payer: Medicare HMO | Admitting: Internal Medicine

## 2017-07-04 ENCOUNTER — Encounter: Payer: Self-pay | Admitting: Internal Medicine

## 2017-07-04 VITALS — BP 132/86 | HR 66 | Temp 98.1°F | Ht 70.0 in | Wt 190.0 lb

## 2017-07-04 DIAGNOSIS — Z0000 Encounter for general adult medical examination without abnormal findings: Secondary | ICD-10-CM

## 2017-07-04 DIAGNOSIS — R7302 Impaired glucose tolerance (oral): Secondary | ICD-10-CM | POA: Diagnosis not present

## 2017-07-04 MED ORDER — ZOSTER VAC RECOMB ADJUVANTED 50 MCG/0.5ML IM SUSR: 0.5000 mL | Freq: Once | INTRAMUSCULAR | 1 refills | Status: AC

## 2017-07-04 NOTE — Progress Notes (Signed)
Subjective:    Patient ID: Jonathon [name], male    DOB: 1946-01-07, 72 y.o.   MRN: [id number]  HPI  Here for wellness and f/u;  Overall doing ok;  Pt denies Chest pain, worsening SOB, DOE, wheezing, orthopnea, PND, worsening LE edema, palpitations, dizziness or syncope.  Pt denies neurological change such as new headache, facial or extremity weakness.  Pt denies polydipsia, polyuria, or low sugar symptoms. Pt states overall good compliance with treatment and medications, good tolerability, and has been trying to follow appropriate diet.  Pt denies worsening depressive symptoms, suicidal ideation or panic. No fever, night sweats, wt loss, loss of appetite, or other constitutional symptoms.  Pt states good ability with ADL's, has low fall risk, home safety reviewed and adequate, no other significant changes in hearing or vision, and only occasionally active with exercise. No other new complaint or interval hx Past Medical History:  Diagnosis Date  . Atrial fibrillation ([place])    post op after CABG; tx with amiodarone  . Coronary artery disease    a. s/p NSTEMI 12/12 => CABG;  b. inferior STEMI 5/14: 3/3 grafts patent, native CFX 100% => Promus DES   . HLD (hyperlipidemia)   . HTN (hypertension)   . Lumbar disc disease 03/19/2011  . Psoriasis 03/22/2011  . S/P CABG (coronary artery bypass graft)    12/12 with Dr. Cyndia Bent: LIMA-LAD, SVG-RI, SVG-OM  . Sciatica 03/19/2011   Past Surgical History:  Procedure Laterality Date  . CORONARY ARTERY BYPASS GRAFT  01/19/2011   Procedure: CORONARY ARTERY BYPASS GRAFTING (CABG);  Surgeon: Gaye Pollack, MD;  Location: [facility];  Service: Open Heart Surgery;  Laterality: N/A;  . LEFT HEART CATH N/A 07/07/2012   Procedure: LEFT HEART CATH;  Surgeon: Peter M Martinique, MD;  Location: [facility] CATH LAB;  Service: Cardiovascular;  Laterality: N/A;  . LEFT HEART CATHETERIZATION WITH CORONARY ANGIOGRAM N/A 01/13/2011   Procedure: LEFT HEART CATHETERIZATION WITH CORONARY ANGIOGRAM;   Surgeon: Peter M Martinique, MD;  Location: [facility] CATH LAB;  Service: Cardiovascular;  Laterality: N/A;  . PERCUTANEOUS CORONARY STENT INTERVENTION (PCI-S)  07/07/2012   Procedure: PERCUTANEOUS CORONARY STENT INTERVENTION (PCI-S);  Surgeon: Peter M Martinique, MD;  Location: [facility] CATH LAB;  Service: Cardiovascular;;  . TONSILLECTOMY      reports that he quit smoking about 49 years ago. His smoking use included cigarettes. He has never used smokeless tobacco. He reports that he does not drink alcohol or use drugs. family history includes Cancer in his mother; Colon cancer in his mother; Heart disease in his father and mother; Hyperlipidemia in his father. Allergies  Allergen Reactions  . Dilaudid [Hydromorphone Hcl] Hypertension    Back and forth with hyper and hypotension   Current Outpatient Medications on File Prior to Visit  Medication Sig Dispense Refill  . aspirin EC 81 MG tablet Take 81 mg by mouth at bedtime.    Marland Kitchen levothyroxine (SYNTHROID, LEVOTHROID) 50 MCG tablet TAKE 1 TABLET BY MOUTH DAILY BEFORE BREAKFAST 90 tablet 3  . metoprolol tartrate (LOPRESSOR) 25 MG tablet TAKE 1 TABLET(25 MG) BY MOUTH TWICE DAILY. 180 tablet 3  . nitroGLYCERIN (NITROSTAT) 0.4 MG SL tablet Place 1 tablet (0.4 mg total) under the tongue every 5 (five) minutes as needed for chest pain. 25 tablet 3  . rosuvastatin (CRESTOR) 40 MG tablet TAKE 1 TABLET(40 MG) BY MOUTH DAILY 90 tablet 3  . valACYclovir (VALTREX) 1000 MG tablet Take 1 tablet (1,000 mg total) by mouth 3 (three)  times daily. 21 tablet 0   No current facility-administered medications on file prior to visit.    Review of Systems Constitutional: Negative for other unusual diaphoresis, sweats, appetite or weight changes HENT: Negative for other worsening hearing loss, ear pain, facial swelling, mouth sores or neck stiffness.   Eyes: Negative for other worsening pain, redness or other visual disturbance.  Respiratory: Negative for other stridor or  swelling Cardiovascular: Negative for other palpitations or other chest pain  Gastrointestinal: Negative for worsening diarrhea or loose stools, blood in stool, distention or other pain Genitourinary: Negative for hematuria, flank pain or other change in urine volume.  Musculoskeletal: Negative for myalgias or other joint swelling.  Skin: Negative for other color change, or other wound or worsening drainage.  Neurological: Negative for other syncope or numbness. Hematological: Negative for other adenopathy or swelling Psychiatric/Behavioral: Negative for hallucinations, other worsening agitation, SI, self-injury, or new decreased concentration All other system neg per pt    Objective:   Physical Exam BP 132/86   Pulse 66   Temp 98.1 F (36.7 C) (Oral)   Ht 5\' 10"  (1.778 m)   Wt 190 lb (86.2 kg)   SpO2 98%   BMI 27.26 kg/m  VS noted,  Constitutional: Pt is oriented to person, place, and time. Appears well-developed and well-nourished, in no significant distress and comfortable Head: Normocephalic and atraumatic  Eyes: Conjunctivae and EOM are normal. Pupils are equal, round, and reactive to light Right Ear: External ear normal without discharge Left Ear: External ear normal without discharge Nose: Nose without discharge or deformity Mouth/Throat: Oropharynx is without other ulcerations and moist  Neck: Normal range of motion. Neck supple. No JVD present. No tracheal deviation present or significant neck LA or mass Cardiovascular: Normal rate, regular rhythm, normal heart sounds and intact distal pulses Pulmonary/Chest: WOB normal and breath sounds without rales or wheezing  Abdominal: Soft. Bowel sounds are normal. NT. No HSM  Musculoskeletal: Normal range of motion. Exhibits no edema Lymphadenopathy: Has no other cervical adenopathy.  Neurological: Pt is alert and oriented to person, place, and time. Pt has normal reflexes. No cranial nerve deficit. Motor grossly intact, Gait  intact Skin: Skin is warm and dry. No rash noted or new ulcerations Psychiatric:  Has normal mood and affect. Behavior is normal without agitation No other exam findings Lab Results  Component Value Date   WBC 6.7 06/28/2017   HGB 15.5 06/28/2017   HCT 45.2 06/28/2017   PLT 158.0 06/28/2017   GLUCOSE 105 (H) 06/28/2017   CHOL 114 06/28/2017   TRIG 84.0 06/28/2017   HDL 43.30 06/28/2017   LDLCALC 54 06/28/2017   ALT 21 06/28/2017   AST 26 06/28/2017   NA 141 06/28/2017   K 4.6 06/28/2017   CL 105 06/28/2017   CREATININE 1.15 06/28/2017   BUN 11 06/28/2017   CO2 29 06/28/2017   TSH 3.77 06/28/2017   PSA 2.13 06/28/2017   INR 1.95 (H) 07/07/2012   HGBA1C 5.5 06/28/2017       Assessment & Plan:

## 2017-07-04 NOTE — Patient Instructions (Addendum)
Your shingles shot prescription was sent to the pharmacy  Please continue all other medications as before, and refills have been done if requested.  Please have the pharmacy call with any other refills you may need.  Please continue your efforts at being more active, low cholesterol diet, and weight control.  You are otherwise up to date with prevention measures today.  Please keep your appointments with your specialists as you may have planned  Please return in 1 year for your yearly visit, or sooner if needed, with Lab testing done 3-5 days before     

## 2017-07-04 NOTE — Assessment & Plan Note (Signed)

## 2017-07-04 NOTE — Assessment & Plan Note (Signed)
stable overall by history and exam, recent data reviewed with pt, and pt to continue medical treatment as before,  to f/u any worsening symptoms or concerns Lab Results  Component Value Date   HGBA1C 5.5 06/28/2017

## 2017-09-03 ENCOUNTER — Other Ambulatory Visit: Payer: Self-pay | Admitting: Internal Medicine

## 2017-09-16 NOTE — Progress Notes (Signed)
HPI The patient returns for followup after bypass surgery and subsequent circ stenting.  He had an occluded native CFX treated with a Promus premier DES in May of 2014.  He had a negative stress test last year.  Since he was last seen He walks the dog and works in the [employer].  The patient denies any new symptoms such as chest discomfort, neck or arm discomfort. There has been no new shortness of breath, PND or orthopnea. There have been no reported palpitations, presyncope or syncope.    Allergies  Allergen Reactions  . Dilaudid [Hydromorphone Hcl] Hypertension    Back and forth with hyper and hypotension    Current Outpatient Medications  Medication Sig Dispense Refill  . aspirin EC 81 MG tablet Take 81 mg by mouth at bedtime.    Marland Kitchen levothyroxine (SYNTHROID, LEVOTHROID) 50 MCG tablet TAKE 1 TABLET BY MOUTH DAILY BEFORE BREAKFAST 90 tablet 3  . metoprolol tartrate (LOPRESSOR) 25 MG tablet TAKE 1 TABLET(25 MG) BY MOUTH TWICE DAILY 180 tablet 3  . nitroGLYCERIN (NITROSTAT) 0.4 MG SL tablet Place 1 tablet (0.4 mg total) under the tongue every 5 (five) minutes as needed for chest pain. 25 tablet 3  . rosuvastatin (CRESTOR) 40 MG tablet TAKE 1 TABLET(40 MG) BY MOUTH DAILY 90 tablet 2   No current facility-administered medications for this visit.     Past Medical History:  Diagnosis Date  . Atrial fibrillation ([place])    post op after CABG; tx with amiodarone  . Coronary artery disease    a. s/p NSTEMI 12/12 => CABG;  b. inferior STEMI 5/14: 3/3 grafts patent, native CFX 100% => Promus DES   . HLD (hyperlipidemia)   . HTN (hypertension)   . Lumbar disc disease 03/19/2011  . Psoriasis 03/22/2011  . S/P CABG (coronary artery bypass graft)    12/12 with Dr. Cyndia Bent: LIMA-LAD, SVG-RI, SVG-OM  . Sciatica 03/19/2011    Past Surgical History:  Procedure Laterality Date  . CORONARY ARTERY BYPASS GRAFT  01/19/2011   Procedure: CORONARY ARTERY BYPASS GRAFTING (CABG);  Surgeon: Gaye Pollack,  MD;  Location: [place];  Service: Open Heart Surgery;  Laterality: N/A;  . LEFT HEART CATH N/A 07/07/2012   Procedure: LEFT HEART CATH;  Surgeon: Peter M Martinique, MD;  Location: [facility] CATH LAB;  Service: Cardiovascular;  Laterality: N/A;  . LEFT HEART CATHETERIZATION WITH CORONARY ANGIOGRAM N/A 01/13/2011   Procedure: LEFT HEART CATHETERIZATION WITH CORONARY ANGIOGRAM;  Surgeon: Peter M Martinique, MD;  Location: [facility] CATH LAB;  Service: Cardiovascular;  Laterality: N/A;  . PERCUTANEOUS CORONARY STENT INTERVENTION (PCI-S)  07/07/2012   Procedure: PERCUTANEOUS CORONARY STENT INTERVENTION (PCI-S);  Surgeon: Peter M Martinique, MD;  Location: [facility] CATH LAB;  Service: Cardiovascular;;  . TONSILLECTOMY      ROS:  As stated in the HPI and negative for all other systems.  PHYSICAL EXAM BP 124/76   Pulse 63   Ht 5\' 10"  (1.778 m)   Wt 188 lb 12.8 oz (85.6 kg)   BMI 27.09 kg/m   GENERAL:  Well appearing NECK:  No jugular venous distention, waveform within normal limits, carotid upstroke brisk and symmetric, no bruits, no thyromegaly LUNGS:  Clear to auscultation bilaterally CHEST:  Well healed sternotomy scar. HEART:  PMI not displaced or sustained,S1 and S2 within normal limits, no S3, no S4, no clicks, no rubs, no murmurs ABD:  Flat, positive bowel sounds normal in frequency in pitch, no bruits, no rebound, no guarding,  no midline pulsatile mass, no hepatomegaly, no splenomegaly EXT:  2 plus pulses throughout, no edema, no cyanosis no clubbing   EKG:    Sinus rhythm, rate 63, axis within normal limits, intervals within normal limits, no acute ST-T wave changes.  09/18/2017   Lab Results  Component Value Date   CHOL 114 06/28/2017   TRIG 84.0 06/28/2017   HDL 43.30 06/28/2017   LDLCALC 54 06/28/2017    ASSESSMENT AND PLAN   CAD -  Since his stress test last year.  Since then has had no further cardiovascular symptoms.  No further testing is indicated.  Hypertension - The blood pressure is at target.   No change in therapy.   Hyperlipidemia -  Lipids are as above.  Is at target.  Continue current therapy.

## 2017-09-17 ENCOUNTER — Other Ambulatory Visit: Payer: Self-pay | Admitting: Internal Medicine

## 2017-09-18 ENCOUNTER — Ambulatory Visit: Payer: Medicare HMO | Admitting: Cardiology

## 2017-09-18 ENCOUNTER — Encounter: Payer: Self-pay | Admitting: Cardiology

## 2017-09-18 VITALS — BP 124/76 | HR 63 | Ht 70.0 in | Wt 188.8 lb

## 2017-09-18 DIAGNOSIS — E785 Hyperlipidemia, unspecified: Secondary | ICD-10-CM | POA: Diagnosis not present

## 2017-09-18 DIAGNOSIS — I10 Essential (primary) hypertension: Secondary | ICD-10-CM | POA: Diagnosis not present

## 2017-09-18 DIAGNOSIS — I2510 Atherosclerotic heart disease of native coronary artery without angina pectoris: Secondary | ICD-10-CM

## 2017-09-18 MED ORDER — NITROGLYCERIN 0.4 MG SL SUBL: 0.4000 mg | SUBLINGUAL_TABLET | SUBLINGUAL | 3 refills | Status: DC | PRN

## 2017-09-18 NOTE — Patient Instructions (Signed)
Medication Instructions:  Your physician recommends that you continue on your current medications as directed. Please refer to the Current Medication list given to you today.   Labwork: NONE  Testing/Procedures: NONE  Follow-Up: Your physician wants you to follow-up in: [place] DR. [place]. You will receive a reminder letter in the mail two months in advance. If you don't receive a letter, please call our office to schedule the follow-up appointment.   Any Other Special Instructions Will Be Listed Below (If Applicable).     If you need a refill on your cardiac medications before your next appointment, please call your pharmacy.

## 2017-09-25 DIAGNOSIS — R972 Elevated prostate specific antigen [PSA]: Secondary | ICD-10-CM | POA: Diagnosis not present

## 2017-09-27 DIAGNOSIS — N400 Enlarged prostate without lower urinary tract symptoms: Secondary | ICD-10-CM | POA: Diagnosis not present

## 2017-11-16 ENCOUNTER — Other Ambulatory Visit: Payer: Self-pay | Admitting: Internal Medicine

## 2017-12-27 ENCOUNTER — Encounter: Payer: Self-pay | Admitting: Internal Medicine

## 2018-02-10 ENCOUNTER — Other Ambulatory Visit: Payer: Self-pay | Admitting: Internal Medicine

## 2018-06-21 ENCOUNTER — Other Ambulatory Visit: Payer: Self-pay | Admitting: Internal Medicine

## 2018-06-29 ENCOUNTER — Encounter: Payer: Self-pay | Admitting: Internal Medicine

## 2018-07-02 ENCOUNTER — Other Ambulatory Visit (INDEPENDENT_AMBULATORY_CARE_PROVIDER_SITE_OTHER): Payer: Medicare HMO

## 2018-07-02 DIAGNOSIS — Z0000 Encounter for general adult medical examination without abnormal findings: Secondary | ICD-10-CM | POA: Diagnosis not present

## 2018-07-02 DIAGNOSIS — R7302 Impaired glucose tolerance (oral): Secondary | ICD-10-CM | POA: Diagnosis not present

## 2018-07-02 LAB — HEPATIC FUNCTION PANEL
ALT: 19 U/L (ref 0–53)
AST: 26 U/L (ref 0–37)
Albumin: 4.1 g/dL (ref 3.5–5.2)
Alkaline Phosphatase: 47 U/L (ref 39–117)
Bilirubin, Direct: 0.2 mg/dL (ref 0.0–0.3)
Total Bilirubin: 0.8 mg/dL (ref 0.2–1.2)
Total Protein: 6.7 g/dL (ref 6.0–8.3)

## 2018-07-02 LAB — BASIC METABOLIC PANEL
BUN: 13 mg/dL (ref 6–23)
CO2: 30 mEq/L (ref 19–32)
Calcium: 8.8 mg/dL (ref 8.4–10.5)
Chloride: 105 mEq/L (ref 96–112)
Creatinine, Ser: 1.15 mg/dL (ref 0.40–1.50)
GFR: 62.38 mL/min (ref >60.00)
Glucose, Bld: 101 mg/dL — ABNORMAL HIGH (ref 70–99)
Potassium: 4.6 mEq/L (ref 3.5–5.1)
Sodium: 140 mEq/L (ref 135–145)

## 2018-07-02 LAB — CBC WITH DIFFERENTIAL/PLATELET
Basophils Absolute: 0 10*3/uL (ref 0.0–0.1)
Basophils Relative: 0.3 % (ref 0.0–3.0)
Eosinophils Absolute: 0.2 10*3/uL (ref 0.0–0.7)
Eosinophils Relative: 2.2 % (ref 0.0–5.0)
HCT: 45.6 % (ref 39.0–52.0)
Hemoglobin: 15.9 g/dL (ref 13.0–17.0)
Lymphocytes Relative: 26 % (ref 12.0–46.0)
Lymphs Abs: 2.1 10*3/uL (ref 0.7–4.0)
MCHC: 34.8 g/dL (ref 30.0–36.0)
MCV: 90.8 fl (ref 78.0–100.0)
Monocytes Absolute: 0.7 10*3/uL (ref 0.1–1.0)
Monocytes Relative: 8.2 % (ref 3.0–12.0)
Neutro Abs: 5.2 10*3/uL (ref 1.4–7.7)
Neutrophils Relative %: 63.3 % (ref 43.0–77.0)
Platelets: 155 10*3/uL (ref 150.0–400.0)
RBC: 5.02 Mil/uL (ref 4.22–5.81)
RDW: 13.6 % (ref 11.5–15.5)
WBC: 8.2 10*3/uL (ref 4.0–10.5)

## 2018-07-02 LAB — URINALYSIS, ROUTINE W REFLEX MICROSCOPIC
Bilirubin Urine: NEGATIVE
Hgb urine dipstick: NEGATIVE
Ketones, ur: NEGATIVE
Leukocytes,Ua: NEGATIVE
Nitrite: NEGATIVE
Specific Gravity, Urine: 1.015 (ref 1.000–1.030)
Total Protein, Urine: NEGATIVE
Urine Glucose: 100 — AB
Urobilinogen, UA: 0.2 (ref 0.0–1.0)
pH: 7 (ref 5.0–8.0)

## 2018-07-02 LAB — LIPID PANEL
Cholesterol: 110 mg/dL (ref 0–200)
HDL: 39.1 mg/dL (ref >39.00)
LDL Cholesterol: 54 mg/dL (ref 0–99)
NonHDL: 70.41
Total CHOL/HDL Ratio: 3
Triglycerides: 80 mg/dL (ref 0.0–149.0)
VLDL: 16 mg/dL (ref 0.0–40.0)

## 2018-07-02 LAB — HEMOGLOBIN A1C: Hgb A1c MFr Bld: 5.8 % (ref 4.6–6.5)

## 2018-07-02 LAB — PSA: PSA: 1.85 ng/mL (ref 0.10–4.00)

## 2018-07-02 LAB — TSH: TSH: 4.24 u[IU]/mL (ref 0.35–4.50)

## 2018-07-06 ENCOUNTER — Ambulatory Visit (INDEPENDENT_AMBULATORY_CARE_PROVIDER_SITE_OTHER): Payer: Medicare HMO | Admitting: Internal Medicine

## 2018-07-06 ENCOUNTER — Encounter: Payer: Self-pay | Admitting: Internal Medicine

## 2018-07-06 DIAGNOSIS — R7302 Impaired glucose tolerance (oral): Secondary | ICD-10-CM | POA: Diagnosis not present

## 2018-07-06 DIAGNOSIS — Z0000 Encounter for general adult medical examination without abnormal findings: Secondary | ICD-10-CM

## 2018-07-06 NOTE — Progress Notes (Signed)
Patient ID: Jonathon [name], male   DOB: 26-May-1945, 73 y.o.   MRN: [id number]  Virtual Visit via Video Note  I connected with Jonathon [name] on 07/06/18 at  9:00 AM EDT by a video enabled telemedicine application and verified that I am speaking with the correct person using two identifiers.  Location: Patient: at home Provider: at office   I discussed the limitations of evaluation and management by telemedicine and the availability of in person appointments. The patient expressed understanding and agreed to proceed.  History of Present Illness: Here for wellness and f/u;  Overall doing ok;  Pt denies Chest pain, worsening SOB, DOE, wheezing, orthopnea, PND, worsening LE edema, palpitations, dizziness or syncope.  Pt denies neurological change such as new headache, facial or extremity weakness.  Pt denies polydipsia, polyuria, or low sugar symptoms. Pt states overall good compliance with treatment and medications, good tolerability, and has been trying to follow appropriate diet.  Pt denies worsening depressive symptoms, suicidal ideation or panic. No fever, night sweats, wt loss, loss of appetite, or other constitutional symptoms.  Pt states good ability with ADL's, has low fall risk, home safety reviewed and adequate, no other significant changes in hearing or vision, and only occasionally active with exercise. No new complaints Past Medical History:  Diagnosis Date  . Atrial fibrillation ([place])    post op after CABG; tx with amiodarone  . Coronary artery disease    a. s/p NSTEMI 12/12 => CABG;  b. inferior STEMI 5/14: 3/3 grafts patent, native CFX 100% => Promus DES   . HLD (hyperlipidemia)   . HTN (hypertension)   . Lumbar disc disease 03/19/2011  . Psoriasis 03/22/2011  . S/P CABG (coronary artery bypass graft)    12/12 with Dr. Cyndia Bent: LIMA-LAD, SVG-RI, SVG-OM  . Sciatica 03/19/2011   Past Surgical History:  Procedure Laterality Date  . CORONARY ARTERY BYPASS GRAFT  01/19/2011    Procedure: CORONARY ARTERY BYPASS GRAFTING (CABG);  Surgeon: Gaye Pollack, MD;  Location: [place];  Service: Open Heart Surgery;  Laterality: N/A;  . LEFT HEART CATH N/A 07/07/2012   Procedure: LEFT HEART CATH;  Surgeon: Peter M Martinique, MD;  Location: [facility] CATH LAB;  Service: Cardiovascular;  Laterality: N/A;  . LEFT HEART CATHETERIZATION WITH CORONARY ANGIOGRAM N/A 01/13/2011   Procedure: LEFT HEART CATHETERIZATION WITH CORONARY ANGIOGRAM;  Surgeon: Peter M Martinique, MD;  Location: [facility] CATH LAB;  Service: Cardiovascular;  Laterality: N/A;  . PERCUTANEOUS CORONARY STENT INTERVENTION (PCI-S)  07/07/2012   Procedure: PERCUTANEOUS CORONARY STENT INTERVENTION (PCI-S);  Surgeon: Peter M Martinique, MD;  Location: [facility] C[facility] LAB;  Service: Cardiovascular;;  . TONSILLECTOMY      reports that he quit smoking about 50 years ago. His smoking use included cigarettes. He has never used smokeless tobacco. He reports that he does not drink alcohol or use drugs. family history includes Cancer in his mother; Colon cancer in his mother; Heart disease in his father and mother; Hyperlipidemia in his father. Allergies  Allergen Reactions  . Dilaudid [Hydromorphone Hcl] Hypertension    Back and forth with hyper and hypotension   Current Outpatient Medications on File Prior to Visit  Medication Sig Dispense Refill  . aspirin EC 81 MG tablet Take 81 mg by mouth at bedtime.    Marland Kitchen levothyroxine (SYNTHROID, LEVOTHROID) 50 MCG tablet TAKE 1 TABLET BY MOUTH DAILY BEFORE BREAKFAST 90 tablet 1  . metoprolol tartrate (LOPRESSOR) 25 MG tablet TAKE 1 TABLET(25 MG) BY MOUTH TWICE DAILY  180 tablet 3  . nitroGLYCERIN (NITROSTAT) 0.4 MG SL tablet Place 1 tablet (0.4 mg total) under the tongue every 5 (five) minutes as needed for chest pain. 25 tablet 3  . rosuvastatin (CRESTOR) 40 MG tablet TAKE 1 TABLET(40 MG) BY MOUTH DAILY 90 tablet 0   No current facility-administered medications on file prior to visit.      Observations/Objective: Alert, NAD, appropriate mood and affect, resps normal, cn 2-12 intact, moves all 4s, no visible rash or swelling Lab Results  Component Value Date   WBC 8.2 07/02/2018   HGB 15.9 07/02/2018   HCT 45.6 07/02/2018   PLT 155.0 07/02/2018   GLUCOSE 101 (H) 07/02/2018   CHOL 110 07/02/2018   TRIG 80.0 07/02/2018   HDL 39.10 07/02/2018   LDLCALC 54 07/02/2018   ALT 19 07/02/2018   AST 26 07/02/2018   NA 140 07/02/2018   K 4.6 07/02/2018   CL 105 07/02/2018   CREATININE 1.15 07/02/2018   BUN 13 07/02/2018   CO2 30 07/02/2018   TSH 4.24 07/02/2018   PSA 1.85 07/02/2018   INR 1.95 (H) 07/07/2012   HGBA1C 5.8 07/02/2018   Assessment and Plan: See notes  Follow Up Instructions: See notes   I discussed the assessment and treatment plan with the patient. The patient was provided an opportunity to ask questions and all were answered. The patient agreed with the plan and demonstrated an understanding of the instructions.   The patient was advised to call back or seek an in-person evaluation if the symptoms worsen or if the condition fails to improve as anticipated.   Cathlean Cower, MD

## 2018-07-06 NOTE — Patient Instructions (Signed)
Please continue all other medications as before, and refills have been done if requested.  Please have the pharmacy call with any other refills you may need.  Please continue your efforts at being more active, low cholesterol diet, and weight control.  You are otherwise up to date with prevention measures today.  Please keep your appointments with your specialists as you may have planned  Please return in 1 year for your yearly visit, or sooner if needed, with Lab testing done 3-5 days before  

## 2018-07-07 ENCOUNTER — Encounter: Payer: Self-pay | Admitting: Internal Medicine

## 2018-07-07 NOTE — Assessment & Plan Note (Signed)

## 2018-07-07 NOTE — Assessment & Plan Note (Signed)
stable overall by history and exam, recent data reviewed with pt, and pt to continue medical treatment as before,  to f/u any worsening symptoms or concerns  

## 2018-08-24 ENCOUNTER — Telehealth: Payer: Self-pay | Admitting: Internal Medicine

## 2018-08-24 MED ORDER — LEVOTHYROXINE SODIUM 50 MCG PO TABS: ORAL_TABLET | ORAL | 1 refills | Status: DC

## 2018-08-24 NOTE — Telephone Encounter (Signed)
Copied from Center [id number]. Topic: Quick Communication - Rx Refill/Question >> Aug 24, 2018 12:00 PM Mcneil, Ja-Kwan wrote: Medication: levothyroxine (SYNTHROID, LEVOTHROID) 50 MCG tablet   Has the patient contacted their pharmacy? yes   Preferred Pharmacy (with phone number or street name): [facility] [id number], [place] [phone] (Phone) [phone] (Fax)  Agent: Please be advised that RX refills may take up to 3 business days. We ask that you follow-up with your pharmacy.

## 2018-08-27 ENCOUNTER — Other Ambulatory Visit: Payer: Self-pay | Admitting: Internal Medicine

## 2018-09-26 ENCOUNTER — Other Ambulatory Visit: Payer: Self-pay | Admitting: Internal Medicine

## 2018-09-26 MED ORDER — ROSUVASTATIN CALCIUM 40 MG PO TABS: 40.0000 mg | ORAL_TABLET | Freq: Every day | ORAL | 2 refills | Status: DC

## 2018-11-01 ENCOUNTER — Ambulatory Visit: Payer: Medicare HMO | Admitting: Cardiology

## 2018-11-18 NOTE — Progress Notes (Signed)
HPI The patient returns for followup after bypass surgery and subsequent circ stenting.  He had an occluded native CFX treated with a Promus premier DES in May of 2014.  He had a negative stress test in 2018.   He returns for follow up.  Since I last saw him he has done well.  The patient denies any new symptoms such as chest discomfort, neck or arm discomfort. There has been no new shortness of breath, PND or orthopnea. There have been no reported palpitations, presyncope or syncope.  He is staining a deck.  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  He walks the dog.   Allergies  Allergen Reactions  . Dilaudid [Hydromorphone Hcl] Hypertension    Back and forth with hyper and hypotension    Current Outpatient Medications  Medication Sig Dispense Refill  . aspirin EC 81 MG tablet Take 81 mg by mouth at bedtime.    Marland Kitchen levothyroxine (SYNTHROID) 50 MCG tablet TAKE 1 TABLET BY MOUTH DAILY BEFORE BREAKFAST 90 tablet 1  . metoprolol tartrate (LOPRESSOR) 25 MG tablet TAKE 1 TABLET(25 MG) BY MOUTH TWICE DAILY 180 tablet 3  . nitroGLYCERIN (NITROSTAT) 0.4 MG SL tablet Place 1 tablet (0.4 mg total) under the tongue every 5 (five) minutes as needed for chest pain. 25 tablet 3  . rosuvastatin (CRESTOR) 40 MG tablet Take 1 tablet (40 mg total) by mouth daily. 90 tablet 2   No current facility-administered medications for this visit.     Past Medical History:  Diagnosis Date  . Atrial fibrillation ([place])    post op after CABG; tx with amiodarone  . Coronary artery disease    a. s/p NSTEMI 12/12 => CABG;  b. inferior STEMI 5/14: 3/3 grafts patent, native CFX 100% => Promus DES   . HLD (hyperlipidemia)   . HTN (hypertension)   . Lumbar disc disease 03/19/2011  . Psoriasis 03/22/2011  . S/P CABG (coronary artery bypass graft)    12/12 with Dr. Cyndia Bent: LIMA-LAD, SVG-RI, SVG-OM  . Sciatica 03/19/2011    Past Surgical History:  Procedure Laterality Date   . CORONARY ARTERY BYPASS GRAFT  01/19/2011   Procedure: CORONARY ARTERY BYPASS GRAFTING (CABG);  Surgeon: Gaye Pollack, MD;  Location: [place];  Service: Open Heart Surgery;  Laterality: N/A;  . LEFT HEART CATH N/A 07/07/2012   Procedure: LEFT HEART CATH;  Surgeon: Peter M Martinique, MD;  Location: [facility] CATH LAB;  Service: Cardiovascular;  Laterality: N/A;  . LEFT HEART CATHETERIZATION WITH CORONARY ANGIOGRAM N/A 01/13/2011   Procedure: LEFT HEART CATHETERIZATION WITH CORONARY ANGIOGRAM;  Surgeon: Peter M Martinique, MD;  Location: [facility] CATH LAB;  Service: Cardiovascular;  Laterality: N/A;  . PERCUTANEOUS CORONARY STENT INTERVENTION (PCI-S)  07/07/2012   Procedure: PERCUTANEOUS CORONARY STENT INTERVENTION (PCI-S);  Surgeon: Peter M Martinique, MD;  Location: [facility] CATH LAB;  Service: Cardiovascular;;  . TONSILLECTOMY      ROS:  As stated in the HPI and negative for all other systems.  PHYSICAL EXAM BP (!) 144/82   Pulse 62   Temp (!) 96.8 F (36 C)   Ht 5\' 10"  (1.778 m)   Wt 189 lb 12.8 oz (86.1 kg)   SpO2 98%   BMI 27.23 kg/m   GENERAL:  Well appearing NECK:  No jugular venous distention, waveform within normal limits, carotid upstroke brisk and symmetric, no bruits, no thyromegaly LUNGS:  Clear to auscultation bilaterally CHEST:  Well healed sternotomy scar. HEART:  PMI not displaced or sustained,S1 and S2 within normal limits, no S3, no S4, no clicks, no rubs, no murmurs ABD:  Flat, positive bowel sounds normal in frequency in pitch, no bruits, no rebound, no guarding, no midline pulsatile mass, no hepatomegaly, no splenomegaly EXT:  2 plus pulses throughout, no edema, no cyanosis no clubbing  EKG:    Sinus rhythm, rate 58, axis within normal limits, intervals within normal limits, no acute ST-T wave changes.  11/19/2018   Lab Results  Component Value Date   CHOL 110 07/02/2018   TRIG 80.0 07/02/2018   HDL 39.10 07/02/2018   LDLCALC 54 07/02/2018    ASSESSMENT AND PLAN   CAD -  The  patient has no new sypmtoms.  No further cardiovascular testing is indicated.  We will continue with aggressive risk reduction and meds as listed.  Hypertension - The blood pressure is at target.  No change in therapy.   Hyperlipidemia -  Lipids are is at target.  No change in therapy.

## 2018-11-19 ENCOUNTER — Ambulatory Visit (INDEPENDENT_AMBULATORY_CARE_PROVIDER_SITE_OTHER): Payer: Medicare HMO | Admitting: Cardiology

## 2018-11-19 ENCOUNTER — Encounter: Payer: Self-pay | Admitting: Cardiology

## 2018-11-19 ENCOUNTER — Other Ambulatory Visit: Payer: Self-pay

## 2018-11-19 VITALS — BP 144/82 | HR 62 | Temp 96.8°F | Ht 70.0 in | Wt 189.8 lb

## 2018-11-19 DIAGNOSIS — I10 Essential (primary) hypertension: Secondary | ICD-10-CM | POA: Diagnosis not present

## 2018-11-19 DIAGNOSIS — E785 Hyperlipidemia, unspecified: Secondary | ICD-10-CM

## 2018-11-19 DIAGNOSIS — I2510 Atherosclerotic heart disease of native coronary artery without angina pectoris: Secondary | ICD-10-CM | POA: Diagnosis not present

## 2018-11-19 NOTE — Patient Instructions (Signed)
Medication Instructions:  Your physician recommends that you continue on your current medications as directed. Please refer to the Current Medication list given to you today.  If you need a refill on your cardiac medications before your next appointment, please call your pharmacy.   Lab work: NONE  Testing/Procedures: NONE  Follow-Up: At [facility], you and your health needs are our priority.  As part of our continuing mission to provide you with exceptional heart care, we have created designated Provider Care Teams.  These Care Teams include your primary Cardiologist (physician) and Advanced Practice Providers (APPs -  Physician Assistants and Nurse Practitioners) who all work together to provide you with the care you need, when you need it. You will need a follow up appointment in 12 months.  Please call our office 2 months in advance to schedule this appointment.  You may see Dr. Hochrein or one of the following Advanced Practice Providers on your designated Care Team:   Rhonda Barrett, PA-C Kathryn Lawrence, DNP, ANP      

## 2019-02-16 ENCOUNTER — Encounter: Payer: Self-pay | Admitting: Internal Medicine

## 2019-02-18 MED ORDER — LEVOTHYROXINE SODIUM 50 MCG PO TABS: ORAL_TABLET | ORAL | 1 refills | Status: DC

## 2019-05-27 ENCOUNTER — Other Ambulatory Visit: Payer: Self-pay | Admitting: Internal Medicine

## 2019-06-26 ENCOUNTER — Other Ambulatory Visit: Payer: Self-pay | Admitting: Internal Medicine

## 2019-06-26 NOTE — Telephone Encounter (Signed)
Please refill as per office routine med refill policy (all routine meds refilled for 3 mo or monthly per pt preference up to one year from last visit, then month to month grace period for 3 mo, then further med refills will have to be denied)  

## 2019-07-02 ENCOUNTER — Encounter: Payer: Self-pay | Admitting: Internal Medicine

## 2019-07-05 ENCOUNTER — Other Ambulatory Visit: Payer: Self-pay

## 2019-07-05 ENCOUNTER — Other Ambulatory Visit (INDEPENDENT_AMBULATORY_CARE_PROVIDER_SITE_OTHER): Payer: Medicare HMO

## 2019-07-05 DIAGNOSIS — R7302 Impaired glucose tolerance (oral): Secondary | ICD-10-CM | POA: Diagnosis not present

## 2019-07-05 DIAGNOSIS — Z0000 Encounter for general adult medical examination without abnormal findings: Secondary | ICD-10-CM | POA: Diagnosis not present

## 2019-07-05 LAB — URINALYSIS, ROUTINE W REFLEX MICROSCOPIC
Bilirubin Urine: NEGATIVE
Ketones, ur: NEGATIVE
Leukocytes,Ua: NEGATIVE
Nitrite: NEGATIVE
Specific Gravity, Urine: 1.025 (ref 1.000–1.030)
Total Protein, Urine: NEGATIVE
Urine Glucose: 100 — AB
Urobilinogen, UA: 0.2 (ref 0.0–1.0)
WBC, UA: NONE SEEN (ref 0–?)
pH: 5.5 (ref 5.0–8.0)

## 2019-07-05 LAB — CBC WITH DIFFERENTIAL/PLATELET
Basophils Absolute: 0 10*3/uL (ref 0.0–0.1)
Basophils Relative: 0.5 % (ref 0.0–3.0)
Eosinophils Absolute: 0.2 10*3/uL (ref 0.0–0.7)
Eosinophils Relative: 2.2 % (ref 0.0–5.0)
HCT: 42 % (ref 39.0–52.0)
Hemoglobin: 14.4 g/dL (ref 13.0–17.0)
Lymphocytes Relative: 27.8 % (ref 12.0–46.0)
Lymphs Abs: 2.1 10*3/uL (ref 0.7–4.0)
MCHC: 34.3 g/dL (ref 30.0–36.0)
MCV: 92 fl (ref 78.0–100.0)
Monocytes Absolute: 0.7 10*3/uL (ref 0.1–1.0)
Monocytes Relative: 9.2 % (ref 3.0–12.0)
Neutro Abs: 4.6 10*3/uL (ref 1.4–7.7)
Neutrophils Relative %: 60.3 % (ref 43.0–77.0)
Platelets: 163 10*3/uL (ref 150.0–400.0)
RBC: 4.57 Mil/uL (ref 4.22–5.81)
RDW: 13 % (ref 11.5–15.5)
WBC: 7.6 10*3/uL (ref 4.0–10.5)

## 2019-07-05 LAB — BASIC METABOLIC PANEL
BUN: 19 mg/dL (ref 6–23)
CO2: 29 mEq/L (ref 19–32)
Calcium: 8.9 mg/dL (ref 8.4–10.5)
Chloride: 105 mEq/L (ref 96–112)
Creatinine, Ser: 1.12 mg/dL (ref 0.40–1.50)
GFR: 64.14 mL/min (ref >60.00)
Glucose, Bld: 112 mg/dL — ABNORMAL HIGH (ref 70–99)
Potassium: 4.3 mEq/L (ref 3.5–5.1)
Sodium: 137 mEq/L (ref 135–145)

## 2019-07-05 LAB — HEPATIC FUNCTION PANEL
ALT: 18 U/L (ref 0–53)
AST: 26 U/L (ref 0–37)
Albumin: 4.1 g/dL (ref 3.5–5.2)
Alkaline Phosphatase: 43 U/L (ref 39–117)
Bilirubin, Direct: 0.2 mg/dL (ref 0.0–0.3)
Total Bilirubin: 0.8 mg/dL (ref 0.2–1.2)
Total Protein: 6.5 g/dL (ref 6.0–8.3)

## 2019-07-05 LAB — LIPID PANEL
Cholesterol: 105 mg/dL (ref 0–200)
HDL: 36 mg/dL — ABNORMAL LOW (ref >39.00)
LDL Cholesterol: 54 mg/dL (ref 0–99)
NonHDL: 69.35
Total CHOL/HDL Ratio: 3
Triglycerides: 76 mg/dL (ref 0.0–149.0)
VLDL: 15.2 mg/dL (ref 0.0–40.0)

## 2019-07-05 LAB — PSA: PSA: 1.85 ng/mL (ref 0.10–4.00)

## 2019-07-05 LAB — HEMOGLOBIN A1C: Hgb A1c MFr Bld: 5.6 % (ref 4.6–6.5)

## 2019-07-05 LAB — TSH: TSH: 5.02 u[IU]/mL — ABNORMAL HIGH (ref 0.35–4.50)

## 2019-07-11 ENCOUNTER — Other Ambulatory Visit: Payer: Self-pay

## 2019-07-11 ENCOUNTER — Encounter: Payer: Self-pay | Admitting: Internal Medicine

## 2019-07-11 ENCOUNTER — Ambulatory Visit (INDEPENDENT_AMBULATORY_CARE_PROVIDER_SITE_OTHER): Payer: Medicare HMO | Admitting: Internal Medicine

## 2019-07-11 VITALS — BP 120/76 | HR 60 | Temp 97.9°F | Ht 70.0 in | Wt 186.0 lb

## 2019-07-11 DIAGNOSIS — I10 Essential (primary) hypertension: Secondary | ICD-10-CM | POA: Diagnosis not present

## 2019-07-11 DIAGNOSIS — R739 Hyperglycemia, unspecified: Secondary | ICD-10-CM

## 2019-07-11 DIAGNOSIS — R21 Rash and other nonspecific skin eruption: Secondary | ICD-10-CM | POA: Diagnosis not present

## 2019-07-11 DIAGNOSIS — R7302 Impaired glucose tolerance (oral): Secondary | ICD-10-CM

## 2019-07-11 DIAGNOSIS — Z0000 Encounter for general adult medical examination without abnormal findings: Secondary | ICD-10-CM | POA: Diagnosis not present

## 2019-07-11 DIAGNOSIS — E039 Hypothyroidism, unspecified: Secondary | ICD-10-CM | POA: Diagnosis not present

## 2019-07-11 DIAGNOSIS — E785 Hyperlipidemia, unspecified: Secondary | ICD-10-CM | POA: Diagnosis not present

## 2019-07-11 NOTE — Patient Instructions (Signed)
Please go to [email or web address] for the appt to have the vaccination, although I have heard they do now take walk-ins  Please continue all other medications as before, and refills have been done if requested.  Please have the pharmacy call with any other refills you may need.  Please continue your efforts at being more active, low cholesterol diet, and weight control.  You are otherwise up to date with prevention measures today.  Please keep your appointments with your specialists as you may have planned  Please call with the name of your dermatologist to whom you would like to be referred  Please make an Appointment to return for your 1 year visit, or sooner if needed, with Lab testing by Appointment as well, to be done about 3-5 days before at the [facility] (so this is for TWO appointments - please see the scheduling desk as you leave)

## 2019-07-11 NOTE — Progress Notes (Signed)
Subjective:    Patient ID: Jonathon [name], male    DOB: [birth year], 74 y.o.   MRN: [id number]  HPI  Here for wellness and f/u;  Overall doing ok;  Pt denies Chest pain, worsening SOB, DOE, wheezing, orthopnea, PND, worsening LE edema, palpitations, dizziness or syncope.  Pt denies neurological change such as new headache, facial or extremity weakness.  Pt denies polydipsia, polyuria, or low sugar symptoms. Pt states overall good compliance with treatment and medications, good tolerability, and has been trying to follow appropriate diet.  Pt denies worsening depressive symptoms, suicidal ideation or panic. No fever, night sweats, wt loss, loss of appetite, or other constitutional symptoms.  Pt states good ability with ADL's, has low fall risk, home safety reviewed and adequate, no other significant changes in hearing or vision, and only occasionally active with exercise.   Also has a facial rash with scaly erythema nontender to bilat facies and forehead, ongoing for several months.  Denies hyper or hypo thyroid symptoms such as voice, skin or hair change.   Past Medical History:  Diagnosis Date  . Atrial fibrillation ([place])    post op after CABG; tx with amiodarone  . Coronary artery disease    a. s/p NSTEMI 12/12 => CABG;  b. inferior STEMI 5/14: 3/3 grafts patent, native CFX 100% => Promus DES   . HLD (hyperlipidemia)   . HTN (hypertension)   . Lumbar disc disease 03/19/2011  . Psoriasis 03/22/2011  . S/P CABG (coronary artery bypass graft)    12/12 with Dr. Cyndia Bent: LIMA-LAD, SVG-RI, SVG-OM  . Sciatica 03/19/2011   Past Surgical History:  Procedure Laterality Date  . CORONARY ARTERY BYPASS GRAFT  01/19/2011   Procedure: CORONARY ARTERY BYPASS GRAFTING (CABG);  Surgeon: Gaye Pollack, MD;  Location: [place];  Service: Open Heart Surgery;  Laterality: N/A;  . LEFT HEART CATH N/A 07/07/2012   Procedure: LEFT HEART CATH;  Surgeon: Peter M Martinique, MD;  Location: [facility] CATH LAB;  Service: Cardiovascular;   Laterality: N/A;  . LEFT HEART CATHETERIZATION WITH CORONARY ANGIOGRAM N/A 01/13/2011   Procedure: LEFT HEART CATHETERIZATION WITH CORONARY ANGIOGRAM;  Surgeon: Peter M Martinique, MD;  Location: [facility] CATH LAB;  Service: Cardiovascular;  Laterality: N/A;  . PERCUTANEOUS CORONARY STENT INTERVENTION (PCI-S)  07/07/2012   Procedure: PERCUTANEOUS CORONARY STENT INTERVENTION (PCI-S);  Surgeon: Peter M Martinique, MD;  Location: [facility] CATH LAB;  Service: Cardiovascular;;  . TONSILLECTOMY      reports that he quit smoking about 51 years ago. His smoking use included cigarettes. He has never used smokeless tobacco. He reports that he does not drink alcohol or use drugs. family history includes Cancer in his mother; Colon cancer in his mother; Heart disease in his father and mother; Hyperlipidemia in his father. Allergies  Allergen Reactions  . Dilaudid [Hydromorphone Hcl] Hypertension    Back and forth with hyper and hypotension   Current Outpatient Medications on File Prior to Visit  Medication Sig Dispense Refill  . aspirin EC 81 MG tablet Take 81 mg by mouth at bedtime.    Marland Kitchen levothyroxine (SYNTHROID) 50 MCG tablet TAKE 1 TABLET BY MOUTH BEFORE BREAKFAST 30 tablet 0  . metoprolol tartrate (LOPRESSOR) 25 MG tablet TAKE 1 TABLET(25 MG) BY MOUTH TWICE DAILY 180 tablet 3  . nitroGLYCERIN (NITROSTAT) 0.4 MG SL tablet Place 1 tablet (0.4 mg total) under the tongue every 5 (five) minutes as needed for chest pain. 25 tablet 3  . rosuvastatin (CRESTOR) 40 MG tablet  TAKE 1 TABLET BY MOUTH DAILY 30 tablet 0   No current facility-administered medications on file prior to visit.   Review of Systems All otherwise neg per pt     Objective:   Physical Exam BP 120/76 (BP Location: Left Arm, Patient Position: Sitting, Cuff Size: Large)   Pulse 60   Temp 97.9 F (36.6 C) (Oral)   Ht 5\' 10"  (1.778 m)   Wt 186 lb (84.4 kg)   SpO2 97%   BMI 26.69 kg/m  VS noted,  Constitutional: Pt appears in NAD HENT: Head: NCAT.    Right Ear: External ear normal.  Left Ear: External ear normal.  Eyes: . Pupils are equal, round, and reactive to light. Conjunctivae and EOM are normal Nose: without d/c or deformity Neck: Neck supple. Gross normal ROM Cardiovascular: Normal rate and regular rhythm.   Pulmonary/Chest: Effort normal and breath sounds without rales or wheezing.  Abd:  Soft, NT, ND, + BS, no organomegaly Neurological: Pt is alert. At baseline orientation, motor grossly intact Skin: Skin is warm. Scaly erythema to bilat facies and forehead rashes, other new lesions, no LE edema Psychiatric: Pt behavior is normal without agitation  All otherwise neg per pt Lab Results  Component Value Date   WBC 7.6 07/05/2019   HGB 14.4 07/05/2019   HCT 42.0 07/05/2019   PLT 163.0 07/05/2019   GLUCOSE 112 (H) 07/05/2019   CHOL 105 07/05/2019   TRIG 76.0 07/05/2019   HDL 36.00 (L) 07/05/2019   LDLCALC 54 07/05/2019   ALT 18 07/05/2019   AST 26 07/05/2019   NA 137 07/05/2019   K 4.3 07/05/2019   CL 105 07/05/2019   CREATININE 1.12 07/05/2019   BUN 19 07/05/2019   CO2 29 07/05/2019   TSH 5.02 (H) 07/05/2019   PSA 1.85 07/05/2019   INR 1.95 (H) 07/07/2012   HGBA1C 5.6 07/05/2019      Assessment & Plan:

## 2019-07-12 ENCOUNTER — Encounter: Payer: Self-pay | Admitting: Internal Medicine

## 2019-07-12 DIAGNOSIS — R21 Rash and other nonspecific skin eruption: Secondary | ICD-10-CM | POA: Insufficient documentation

## 2019-07-12 DIAGNOSIS — R739 Hyperglycemia, unspecified: Secondary | ICD-10-CM | POA: Insufficient documentation

## 2019-07-12 NOTE — Assessment & Plan Note (Signed)

## 2019-07-12 NOTE — Assessment & Plan Note (Signed)
stable overall by history and exam, recent data reviewed with pt, and pt to continue medical treatment as before,  to f/u any worsening symptoms or concerns  

## 2019-07-12 NOTE — Assessment & Plan Note (Addendum)
C/w likely seborrhea, for derm referral,  to f/u any worsening symptoms or concerns  I spent 31 minutes in addition to time for CPX wellness examination in preparing to see the patient by review of recent labs, imaging and procedures, obtaining and reviewing separately obtained history, communicating with the patient and family or caregiver, ordering medications, tests or procedures, and documenting clinical information in the EHR including the differential Dx, treatment, and any further evaluation and other management of rash, hyperglycemia, hypothyroidism, htn, hld

## 2019-08-03 ENCOUNTER — Other Ambulatory Visit: Payer: Self-pay | Admitting: Internal Medicine

## 2019-08-03 NOTE — Telephone Encounter (Signed)
Please refill as per office routine med refill policy (all routine meds refilled for 3 mo or monthly per pt preference up to one year from last visit, then month to month grace period for 3 mo, then further med refills will have to be denied)  

## 2019-08-06 ENCOUNTER — Other Ambulatory Visit: Payer: Self-pay | Admitting: Internal Medicine

## 2019-08-06 NOTE — Telephone Encounter (Signed)
Please refill as per office routine med refill policy (all routine meds refilled for 3 mo or monthly per pt preference up to one year from last visit, then month to month grace period for 3 mo, then further med refills will have to be denied)  

## 2019-08-08 ENCOUNTER — Other Ambulatory Visit: Payer: Self-pay | Admitting: Internal Medicine

## 2019-09-11 ENCOUNTER — Encounter: Payer: Self-pay | Admitting: Internal Medicine

## 2019-11-18 DIAGNOSIS — Z7189 Other specified counseling: Secondary | ICD-10-CM | POA: Insufficient documentation

## 2019-11-18 NOTE — Progress Notes (Signed)
Cardiology Office Note   Date:  11/19/2019   ID:  Jonathon [name], DOB 1945/03/07, MRN [id number]  PCP:  Biagio Borg, MD  Cardiologist:   Minus Breeding, MD   No chief complaint on file.     History of Present Illness: Jonathon [name] is a 74 y.o. male who presents for followup after bypass surgery and subsequent circ stenting.  He had an occluded native CFX treated with a Promus premier DES in May of 2014.  He had a negative stress test in 2018.   He returns for follow up.  Since I last saw him he has done very well.  He does a lot of yard work.  He walks the dog. The patient denies any new symptoms such as chest discomfort, neck or arm discomfort. There has been no new shortness of breath, PND or orthopnea. There have been no reported palpitations, presyncope or syncope. .   Past Medical History:  Diagnosis Date  . Atrial fibrillation ([place])    post op after CABG; tx with amiodarone  . Coronary artery disease    a. s/p NSTEMI 12/12 => CABG;  b. inferior STEMI 5/14: 3/3 grafts patent, native CFX 100% => Promus DES   . HLD (hyperlipidemia)   . HTN (hypertension)   . Lumbar disc disease 03/19/2011  . Psoriasis 03/22/2011  . S/P CABG (coronary artery bypass graft)    12/12 with Dr. Cyndia Bent: LIMA-LAD, SVG-RI, SVG-OM  . Sciatica 03/19/2011    Past Surgical History:  Procedure Laterality Date  . CORONARY ARTERY BYPASS GRAFT  01/19/2011   Procedure: CORONARY ARTERY BYPASS GRAFTING (CABG);  Surgeon: Gaye Pollack, MD;  Location: [place];  Service: Open Heart Surgery;  Laterality: N/A;  . LEFT HEART CATH N/A 07/07/2012   Procedure: LEFT HEART CATH;  Surgeon: Peter M Martinique, MD;  Location: [facility] C[facility] LAB;  Service: Cardiovascular;  Laterality: N/A;  . LEFT HEART CATHETERIZATION WITH CORONARY ANGIOGRAM N/A 01/13/2011   Procedure: LEFT HEART CATHETERIZATION WITH CORONARY ANGIOGRAM;  Surgeon: Peter M Martinique, MD;  Location: [facility] CATH LAB;  Service: Cardiovascular;  Laterality: N/A;  .  PERCUTANEOUS CORONARY STENT INTERVENTION (PCI-S)  07/07/2012   Procedure: PERCUTANEOUS CORONARY STENT INTERVENTION (PCI-S);  Surgeon: Peter M Martinique, MD;  Location: [facility] CATH LAB;  Service: Cardiovascular;;  . TONSILLECTOMY       Current Outpatient Medications  Medication Sig Dispense Refill  . aspirin EC 81 MG tablet Take 81 mg by mouth at bedtime.    Marland Kitchen levothyroxine (SYNTHROID) 50 MCG tablet TAKE 1 TABLET BY MOUTH BEFORE BREAKFAST 90 tablet 1  . metoprolol tartrate (LOPRESSOR) 25 MG tablet TAKE 1 TABLET(25 MG) BY MOUTH TWICE DAILY 180 tablet 3  . nitroGLYCERIN (NITROSTAT) 0.4 MG SL tablet Place 1 tablet (0.4 mg total) under the tongue every 5 (five) minutes as needed for chest pain. 25 tablet 3  . rosuvastatin (CRESTOR) 40 MG tablet TAKE 1 TABLET BY MOUTH DAILY 90 tablet 2   No current facility-administered medications for this visit.    Allergies:   Dilaudid [hydromorphone hcl]    ROS:  Please see the history of present illness.   Otherwise, review of systems are positive for none.   All other systems are reviewed and negative.    PHYSICAL EXAM: VS:  BP 125/66   Pulse 67   Ht 5\' 10"  (1.778 m)   Wt 190 lb (86.2 kg)   SpO2 99%   BMI 27.26 kg/m  , BMI Body  mass index is 27.26 kg/m. GENERAL:  Well appearing NECK:  No jugular venous distention, waveform within normal limits, carotid upstroke brisk and symmetric, no bruits, no thyromegaly LUNGS:  Clear to auscultation bilaterally CHEST:  Unremarkable HEART:  PMI not displaced or sustained,S1 and S2 within normal limits, no S3, no S4, no clicks, no rubs, no murmurs ABD:  Flat, positive bowel sounds normal in frequency in pitch, no bruits, no rebound, no guarding, no midline pulsatile mass, no hepatomegaly, no splenomegaly EXT:  2 plus pulses throughout, no edema, no cyanosis no clubbing    EKG:  EKG is ordered today. The ekg ordered today demonstrates sinus rhythm, rate 67, axis within normal limits, intervals within normal  limits, no acute ST-T wave changes.   Recent Labs: 07/05/2019: ALT 18; BUN 19; Creatinine, Ser 1.12; Hemoglobin 14.4; Platelets 163.0; Potassium 4.3; Sodium 137; TSH 5.02    Lipid Panel    Component Value Date/Time   CHOL 105 07/05/2019 0815   TRIG 76.0 07/05/2019 0815   HDL 36.00 (L) 07/05/2019 0815   CHOLHDL 3 07/05/2019 0815   VLDL 15.2 07/05/2019 0815   LDLCALC 54 07/05/2019 0815      Wt Readings from Last 3 Encounters:  11/19/19 190 lb (86.2 kg)  07/11/19 186 lb (84.4 kg)  11/19/18 189 lb 12.8 oz (86.1 kg)      Other studies Reviewed: Additional studies/ records that were reviewed today include: Labs. Review of the above records demonstrates:  Please see elsewhere in the note.     ASSESSMENT AND PLAN:  CAD:  Since the stress test in 2018 he has had no new symptoms.  He participates in aggressive risk reduction.  No change in therapy or further testing is indicated.  HTN: His blood pressure is well controlled.  No change in therapy.    DYSLIPIDEMIA: His LDL is as above.  He is at target.  No change in therapy.  COVID EDUCATION: He has not been vaccinated.  We had a long and healthy discussion about this and he believes he will going get the vaccine.  Current medicines are reviewed at length with the patient today.  The patient does not have concerns regarding medicines.  The following changes have been made:  no change  Labs/ tests ordered today include: None No orders of the defined types were placed in this encounter.    Disposition:   FU with me in 1 year.   Signed, Minus Breeding, MD  11/19/2019 2:43 PM     [facility]

## 2019-11-19 ENCOUNTER — Other Ambulatory Visit: Payer: Self-pay

## 2019-11-19 ENCOUNTER — Ambulatory Visit (INDEPENDENT_AMBULATORY_CARE_PROVIDER_SITE_OTHER): Payer: Medicare HMO | Admitting: Cardiology

## 2019-11-19 ENCOUNTER — Encounter: Payer: Self-pay | Admitting: Cardiology

## 2019-11-19 VITALS — BP 125/66 | HR 67 | Ht 70.0 in | Wt 190.0 lb

## 2019-11-19 DIAGNOSIS — I10 Essential (primary) hypertension: Secondary | ICD-10-CM | POA: Diagnosis not present

## 2019-11-19 DIAGNOSIS — E785 Hyperlipidemia, unspecified: Secondary | ICD-10-CM

## 2019-11-19 DIAGNOSIS — I2510 Atherosclerotic heart disease of native coronary artery without angina pectoris: Secondary | ICD-10-CM

## 2019-11-19 DIAGNOSIS — Z7189 Other specified counseling: Secondary | ICD-10-CM | POA: Diagnosis not present

## 2019-11-19 MED ORDER — NITROGLYCERIN 0.4 MG SL SUBL: 0.4000 mg | SUBLINGUAL_TABLET | SUBLINGUAL | 3 refills | Status: DC | PRN

## 2019-11-19 NOTE — Patient Instructions (Signed)
Medication Instructions:  Your Physician recommend you continue on your current medication as directed.    *If you need a refill on your cardiac medications before your next appointment, please call your pharmacy*   Lab Work: None ordered  Testing/Procedures: None ordered    Follow-Up: At [facility], you and your health needs are our priority.  As part of our continuing mission to provide you with exceptional heart care, we have created designated Provider Care Teams.  These Care Teams include your primary Cardiologist (physician) and Advanced Practice Providers (APPs -  Physician Assistants and Nurse Practitioners) who all work together to provide you with the care you need, when you need it.  We recommend signing up for the patient portal called "MyChart".  Sign up information is provided on this After Visit Summary.  MyChart is used to connect with patients for Virtual Visits (Telemedicine).  Patients are able to view lab/test results, encounter notes, upcoming appointments, etc.  Non-urgent messages can be sent to your provider as well.   To learn more about what you can do with MyChart, go to [email or web address].    Your next appointment:   1 year(s)  The format for your next appointment:   In Person  Provider:   Minus Breeding, MD

## 2019-11-21 NOTE — Addendum Note (Signed)
Addended by: Wonda Horner on: 11/21/2019 12:23 PM   Modules accepted: Orders

## 2019-11-28 NOTE — Addendum Note (Signed)
Addended by: Minus Breeding on: 11/28/2019 09:21 AM   Modules accepted: Orders

## 2020-01-27 ENCOUNTER — Other Ambulatory Visit: Payer: Self-pay | Admitting: Internal Medicine

## 2020-01-28 NOTE — Telephone Encounter (Signed)
Please refill as per office routine med refill policy (all routine meds refilled for 3 mo or monthly per pt preference up to one year from last visit, then month to month grace period for 3 mo, then further med refills will have to be denied)  

## 2020-01-29 ENCOUNTER — Other Ambulatory Visit: Payer: Self-pay

## 2020-01-29 ENCOUNTER — Encounter: Payer: Self-pay | Admitting: Internal Medicine

## 2020-01-29 MED ORDER — LEVOTHYROXINE SODIUM 50 MCG PO TABS: ORAL_TABLET | ORAL | 1 refills | Status: DC

## 2020-05-17 ENCOUNTER — Other Ambulatory Visit: Payer: Self-pay | Admitting: Internal Medicine

## 2020-05-17 NOTE — Telephone Encounter (Signed)
Please refill as per office routine med refill policy (all routine meds refilled for 3 mo or monthly per pt preference up to one year from last visit, then month to month grace period for 3 mo, then further med refills will have to be denied)  

## 2020-05-18 ENCOUNTER — Other Ambulatory Visit: Payer: Self-pay | Admitting: Internal Medicine

## 2020-05-18 NOTE — Telephone Encounter (Signed)
Please refill as per office routine med refill policy (all routine meds refilled for 3 mo or monthly per pt preference up to one year from last visit, then month to month grace period for 3 mo, then further med refills will have to be denied)  

## 2020-05-20 ENCOUNTER — Other Ambulatory Visit: Payer: Self-pay | Admitting: Internal Medicine

## 2020-05-20 NOTE — Telephone Encounter (Signed)
Please refill as per office routine med refill policy (all routine meds refilled for 3 mo or monthly per pt preference up to one year from last visit, then month to month grace period for 3 mo, then further med refills will have to be denied)  

## 2020-06-22 ENCOUNTER — Encounter: Payer: Self-pay | Admitting: Internal Medicine

## 2020-06-22 NOTE — Telephone Encounter (Signed)
Hello Mr. Yuhas, Your annual appointment is due in October 2022, our schedule is not open for October at this time, however I will send your message to our schedulers to assist in getting you an appointment. If you have not heard anything back by July, it might be helpful to call or send another message.  Thank you!

## 2020-07-09 ENCOUNTER — Other Ambulatory Visit (INDEPENDENT_AMBULATORY_CARE_PROVIDER_SITE_OTHER): Payer: Medicare HMO

## 2020-07-09 DIAGNOSIS — R739 Hyperglycemia, unspecified: Secondary | ICD-10-CM | POA: Diagnosis not present

## 2020-07-09 DIAGNOSIS — Z0000 Encounter for general adult medical examination without abnormal findings: Secondary | ICD-10-CM | POA: Diagnosis not present

## 2020-07-09 LAB — URINALYSIS, ROUTINE W REFLEX MICROSCOPIC
Bilirubin Urine: NEGATIVE
Ketones, ur: NEGATIVE
Leukocytes,Ua: NEGATIVE
Nitrite: NEGATIVE
Specific Gravity, Urine: 1.02 (ref 1.000–1.030)
Total Protein, Urine: NEGATIVE
Urine Glucose: 100 — AB
Urobilinogen, UA: 0.2 (ref 0.0–1.0)
pH: 6 (ref 5.0–8.0)

## 2020-07-09 LAB — CBC WITH DIFFERENTIAL/PLATELET
Basophils Absolute: 0.1 10*3/uL (ref 0.0–0.1)
Basophils Relative: 0.8 % (ref 0.0–3.0)
Eosinophils Absolute: 0.3 10*3/uL (ref 0.0–0.7)
Eosinophils Relative: 3.6 % (ref 0.0–5.0)
HCT: 43.3 % (ref 39.0–52.0)
Hemoglobin: 14.9 g/dL (ref 13.0–17.0)
Lymphocytes Relative: 29.5 % (ref 12.0–46.0)
Lymphs Abs: 2.1 10*3/uL (ref 0.7–4.0)
MCHC: 34.4 g/dL (ref 30.0–36.0)
MCV: 90.4 fl (ref 78.0–100.0)
Monocytes Absolute: 0.7 10*3/uL (ref 0.1–1.0)
Monocytes Relative: 9.3 % (ref 3.0–12.0)
Neutro Abs: 4 10*3/uL (ref 1.4–7.7)
Neutrophils Relative %: 56.8 % (ref 43.0–77.0)
Platelets: 161 10*3/uL (ref 150.0–400.0)
RBC: 4.8 Mil/uL (ref 4.22–5.81)
RDW: 13.3 % (ref 11.5–15.5)
WBC: 7.1 10*3/uL (ref 4.0–10.5)

## 2020-07-09 LAB — BASIC METABOLIC PANEL
BUN: 14 mg/dL (ref 6–23)
CO2: 30 mEq/L (ref 19–32)
Calcium: 9 mg/dL (ref 8.4–10.5)
Chloride: 105 mEq/L (ref 96–112)
Creatinine, Ser: 1.18 mg/dL (ref 0.40–1.50)
GFR: 60.68 mL/min (ref >60.00)
Glucose, Bld: 109 mg/dL — ABNORMAL HIGH (ref 70–99)
Potassium: 4.8 mEq/L (ref 3.5–5.1)
Sodium: 140 mEq/L (ref 135–145)

## 2020-07-09 LAB — HEPATIC FUNCTION PANEL
ALT: 20 U/L (ref 0–53)
AST: 31 U/L (ref 0–37)
Albumin: 4 g/dL (ref 3.5–5.2)
Alkaline Phosphatase: 44 U/L (ref 39–117)
Bilirubin, Direct: 0.2 mg/dL (ref 0.0–0.3)
Total Bilirubin: 0.9 mg/dL (ref 0.2–1.2)
Total Protein: 6.5 g/dL (ref 6.0–8.3)

## 2020-07-09 LAB — LIPID PANEL
Cholesterol: 102 mg/dL (ref 0–200)
HDL: 34.8 mg/dL — ABNORMAL LOW (ref >39.00)
LDL Cholesterol: 51 mg/dL (ref 0–99)
NonHDL: 67.17
Total CHOL/HDL Ratio: 3
Triglycerides: 81 mg/dL (ref 0.0–149.0)
VLDL: 16.2 mg/dL (ref 0.0–40.0)

## 2020-07-09 LAB — PSA: PSA: 1.81 ng/mL (ref 0.10–4.00)

## 2020-07-09 LAB — HEMOGLOBIN A1C: Hgb A1c MFr Bld: 5.9 % (ref 4.6–6.5)

## 2020-07-09 LAB — TSH: TSH: 5.68 u[IU]/mL — ABNORMAL HIGH (ref 0.35–4.50)

## 2020-07-10 ENCOUNTER — Other Ambulatory Visit: Payer: Self-pay

## 2020-07-14 ENCOUNTER — Ambulatory Visit (INDEPENDENT_AMBULATORY_CARE_PROVIDER_SITE_OTHER): Payer: Medicare HMO | Admitting: Internal Medicine

## 2020-07-14 ENCOUNTER — Encounter: Payer: Self-pay | Admitting: Internal Medicine

## 2020-07-14 ENCOUNTER — Other Ambulatory Visit: Payer: Self-pay

## 2020-07-14 VITALS — BP 120/62 | HR 66 | Temp 98.1°F | Ht 70.0 in | Wt 193.0 lb

## 2020-07-14 DIAGNOSIS — E039 Hypothyroidism, unspecified: Secondary | ICD-10-CM

## 2020-07-14 DIAGNOSIS — Z0000 Encounter for general adult medical examination without abnormal findings: Secondary | ICD-10-CM

## 2020-07-14 DIAGNOSIS — R739 Hyperglycemia, unspecified: Secondary | ICD-10-CM | POA: Diagnosis not present

## 2020-07-14 DIAGNOSIS — Z0001 Encounter for general adult medical examination with abnormal findings: Secondary | ICD-10-CM | POA: Diagnosis not present

## 2020-07-14 DIAGNOSIS — E559 Vitamin D deficiency, unspecified: Secondary | ICD-10-CM | POA: Diagnosis not present

## 2020-07-14 DIAGNOSIS — E538 Deficiency of other specified B group vitamins: Secondary | ICD-10-CM

## 2020-07-14 DIAGNOSIS — I10 Essential (primary) hypertension: Secondary | ICD-10-CM

## 2020-07-14 DIAGNOSIS — E7800 Pure hypercholesterolemia, unspecified: Secondary | ICD-10-CM | POA: Diagnosis not present

## 2020-07-14 DIAGNOSIS — L0293 Carbuncle, unspecified: Secondary | ICD-10-CM | POA: Insufficient documentation

## 2020-07-14 DIAGNOSIS — Z86010 Personal history of colonic polyps: Secondary | ICD-10-CM | POA: Diagnosis not present

## 2020-07-14 MED ORDER — ROSUVASTATIN CALCIUM 40 MG PO TABS: 40.0000 mg | ORAL_TABLET | Freq: Every day | ORAL | 3 refills | Status: DC

## 2020-07-14 MED ORDER — LEVOTHYROXINE SODIUM 75 MCG PO TABS: 75.0000 ug | ORAL_TABLET | Freq: Every day | ORAL | 3 refills | Status: DC

## 2020-07-14 MED ORDER — METOPROLOL TARTRATE 25 MG PO TABS: ORAL_TABLET | ORAL | 3 refills | Status: DC

## 2020-07-14 NOTE — Assessment & Plan Note (Addendum)
Lab Results  Component Value Date   LDLCALC 51 07/09/2020   Stable, pt to continue current statin crestor 40; also with low HDL - to increase activity

## 2020-07-14 NOTE — Patient Instructions (Addendum)
Ok to increase the thyroid medication to 75 mcg per day  Please continue all other medications as before, and refills have been done if requested.  Please have the pharmacy call with any other refills you may need.  Please continue your efforts at being more active, low cholesterol diet, and weight control.  You are otherwise up to date with prevention measures today.  Please keep your appointments with your specialists as you may have planned  You will be contacted regarding the referral for: colonoscopy  Please make an Appointment to return for your 1 year visit, or sooner if needed, with Lab testing by Appointment as well, to be done about 3-5 days before at the [facility] (so this is for TWO appointments - please see the scheduling desk as you leave)  Due to the ongoing Covid 19 pandemic, our lab now requires an appointment for any labs done at our office.  If you need labs done and do not have an appointment, please call our office ahead of time to schedule before presenting to the lab for your testing.

## 2020-07-14 NOTE — Assessment & Plan Note (Addendum)
Lab Results  Component Value Date   TSH 5.68 (H) 07/09/2020   Mild low thyroid, pt to increase levothyroxine to 75 mcg per day

## 2020-07-14 NOTE — Assessment & Plan Note (Signed)
Lab Results  Component Value Date   HGBA1C 5.9 07/09/2020   Stable, pt to continue current medical treatment  - diet

## 2020-07-14 NOTE — Assessment & Plan Note (Signed)

## 2020-07-14 NOTE — Progress Notes (Signed)
Patient ID: FAIZAN GERACI, male   DOB: 11/21/1945, 75 y.o.   MRN: [id number]         Chief Complaint:: wellness exam and hyperglycemia, hld, low thyroid, hx of colon polyp, recurrent boils       HPI:  SAMULE LIFE is a 75 y.o. male here for wellness exam; declines covid vax and boosters, due for colonoscopy, o/w up to date with preventive referrals and immunizations                        Also has hx of recurrent boils to arms and buttock over last 2 yrs, no hx of MRSA and o/w has been healthy from infectious disease in past; has been slowing down activity wise in the past 2 yrs, Denies hyper or hypo thyroid symptoms such as voice, skin or hair change.   Pt denies polydipsia, polyuria, or new focal neuro s/s.   Pt denies fever, wt loss, night sweats, loss of appetite, or other constitutional symptoms  No other new complaints   Wt Readings from Last 3 Encounters:  07/14/20 193 lb (87.5 kg)  11/19/19 190 lb (86.2 kg)  07/11/19 186 lb (84.4 kg)   BP Readings from Last 3 Encounters:  07/14/20 120/62  11/19/19 125/66  07/11/19 120/76   Immunization History  Administered Date(s) Administered  . Pneumococcal Conjugate-13 06/13/2013  . Pneumococcal Polysaccharide-23 03/22/2011  . Td 06/26/2014  . Zoster Recombinat (Shingrix) 08/03/2017, 12/17/2017   There are no preventive care reminders to display for this patient.    Past Medical History:  Diagnosis Date  . Atrial fibrillation ([place])    post op after CABG; tx with amiodarone  . Coronary artery disease    a. s/p NSTEMI 12/12 => CABG;  b. inferior STEMI 5/14: 3/3 grafts patent, native CFX 100% => Promus DES   . HLD (hyperlipidemia)   . HTN (hypertension)   . Lumbar disc disease 03/19/2011  . Psoriasis 03/22/2011  . S/P CABG (coronary artery bypass graft)    12/12 with Dr. Cyndia Bent: LIMA-LAD, SVG-RI, SVG-OM  . Sciatica 03/19/2011   Past Surgical History:  Procedure Laterality Date  . CORONARY ARTERY BYPASS GRAFT  01/19/2011    Procedure: CORONARY ARTERY BYPASS GRAFTING (CABG);  Surgeon: Gaye Pollack, MD;  Location: [place];  Service: Open Heart Surgery;  Laterality: N/A;  . LEFT HEART CATH N/A 07/07/2012   Procedure: LEFT HEART CATH;  Surgeon: Peter M Martinique, MD;  Location: [facility] CATH LAB;  Service: Cardiovascular;  Laterality: N/A;  . LEFT HEART CATHETERIZATION WITH CORONARY ANGIOGRAM N/A 01/13/2011   Procedure: LEFT HEART CATHETERIZATION WITH CORONARY ANGIOGRAM;  Surgeon: Peter M Martinique, MD;  Location: [facility] CATH LAB;  Service: Cardiovascular;  Laterality: N/A;  . PERCUTANEOUS CORONARY STENT INTERVENTION (PCI-S)  07/07/2012   Procedure: PERCUTANEOUS CORONARY STENT INTERVENTION (PCI-S);  Surgeon: Peter M Martinique, MD;  Location: [facility] CATH LAB;  Service: Cardiovascular;;  . TONSILLECTOMY      reports that he quit smoking about 52 years ago. His smoking use included cigarettes. He has never used smokeless tobacco. He reports that he does not drink alcohol and does not use drugs. family history includes Cancer in his mother; Colon cancer in his mother; Heart disease in his father and mother; Hyperlipidemia in his father. Allergies  Allergen Reactions  . Dilaudid [Hydromorphone Hcl] Hypertension    Back and forth with hyper and hypotension   Current Outpatient Medications on File Prior to Visit  Medication  Sig Dispense Refill  . aspirin EC 81 MG tablet Take 81 mg by mouth at bedtime.    . nitroGLYCERIN (NITROSTAT) 0.4 MG SL tablet Place 1 tablet (0.4 mg total) under the tongue every 5 (five) minutes as needed for chest pain. 25 tablet 3   No current facility-administered medications on file prior to visit.        ROS:  All others reviewed and negative.  Objective        PE:  BP 120/62 (BP Location: Right Arm, Patient Position: Sitting, Cuff Size: Large)   Pulse 66   Temp 98.1 F (36.7 C) (Oral)   Ht 5\' 10"  (1.778 m)   Wt 193 lb (87.5 kg)   SpO2 98%   BMI 27.69 kg/m                 Constitutional: Pt appears in NAD                HENT: Head: NCAT.                Right Ear: External ear normal.                 Left Ear: External ear normal.                Eyes: . Pupils are equal, round, and reactive to light. Conjunctivae and EOM are normal               Nose: without d/c or deformity               Neck: Neck supple. Gross normal ROM               Cardiovascular: Normal rate and regular rhythm.                 Pulmonary/Chest: Effort normal and breath sounds without rales or wheezing.                Abd:  Soft, NT, ND, + BS, no organomegaly               Neurological: Pt is alert. At baseline orientation, motor grossly intact               Skin: Skin is warm. No rashes, no other new lesions, LE edema - none               Psychiatric: Pt behavior is normal without agitation   Micro: none  Cardiac tracings I have personally interpreted today:  none  Pertinent Radiological findings (summarize): none   Lab Results  Component Value Date   WBC 7.1 07/09/2020   HGB 14.9 07/09/2020   HCT 43.3 07/09/2020   PLT 161.0 07/09/2020   GLUCOSE 109 (H) 07/09/2020   CHOL 102 07/09/2020   TRIG 81.0 07/09/2020   HDL 34.80 (L) 07/09/2020   LDLCALC 51 07/09/2020   ALT 20 07/09/2020   AST 31 07/09/2020   NA 140 07/09/2020   K 4.8 07/09/2020   CL 105 07/09/2020   CREATININE 1.18 07/09/2020   BUN 14 07/09/2020   CO2 30 07/09/2020   TSH 5.68 (H) 07/09/2020   PSA 1.81 07/09/2020   INR 1.95 (H) 07/07/2012   HGBA1C 5.9 07/09/2020   Assessment/Plan:  Ilija Maxim Pilz is a 75 y.o. White or Caucasian [1] male with  has a past medical history of Atrial fibrillation ([place]), Coronary artery disease, HLD (hyperlipidemia), HTN (hypertension), Lumbar disc disease (03/19/2011), Psoriasis (03/22/2011),  S/P CABG (coronary artery bypass graft), and Sciatica (03/19/2011).  Encounter for well adult exam with abnormal findings Age and sex appropriate education and counseling updated with regular exercise and diet Referrals for  preventative services -for colonoscopy Immunizations addressed - none needed Smoking counseling  - none needed Evidence for depression or other mood disorder - none significant Most recent labs reviewed. I have personally reviewed and have noted: 1) the patient's medical and social history 2) The patient's current medications and supplements 3) The patient's height, weight, and BMI have been recorded in the chart   Hypertension BP Readings from Last 3 Encounters:  07/14/20 120/62  11/19/19 125/66  07/11/19 120/76   Stable, pt to continue medical treatment lopressor 25   Hyperlipidemia Lab Results  Component Value Date   LDLCALC 51 07/09/2020   Stable, pt to continue current statin crestor 40; also with low HDL - to increase activity  Hypothyroidism Lab Results  Component Value Date   TSH 5.68 (H) 07/09/2020   Mild low thyroid, pt to increase levothyroxine to 75 mcg per day  Hyperglycemia Lab Results  Component Value Date   HGBA1C 5.9 07/09/2020   Stable, pt to continue current medical treatment  - diet   History of colon polyps For colonoscopy  Recurrent boils None today, d/w pt possible mrsa, declines nasal mupirocin today,  to f/u any worsening symptoms or concerns  Followup: Return in about 1 year (around 07/14/2021).  Cathlean Cower, MD 07/14/2020 9:37 PM [facility]

## 2020-07-14 NOTE — Assessment & Plan Note (Signed)
None today, d/w pt possible mrsa, declines nasal mupirocin today,  to f/u any worsening symptoms or concerns

## 2020-07-14 NOTE — Assessment & Plan Note (Signed)
BP Readings from Last 3 Encounters:  07/14/20 120/62  11/19/19 125/66  07/11/19 120/76   Stable, pt to continue medical treatment lopressor 25

## 2020-07-14 NOTE — Assessment & Plan Note (Signed)
For colonoscopy 

## 2020-11-11 ENCOUNTER — Encounter: Payer: Self-pay | Admitting: Gastroenterology

## 2021-01-14 NOTE — Progress Notes (Signed)
Cardiology Office Note   Date:  01/15/2021   ID:  MANVEER GOMES, DOB [birth year], MRN [id number]  PCP:  Biagio Borg, MD  Cardiologist:   Minus Breeding, MD   Chief Complaint  Patient presents with   Coronary Artery Disease       History of Present Illness: Jonathon [name] is a 75 y.o. male who presents for followup after bypass surgery and subsequent circ stenting.  He had an occluded native CFX treated with a Promus premier DES in May of 2014.  He had a negative stress test in 2018.   He returns for follow up.  Since I last saw him he has done very well.  He cardiovascular symptoms.  He still walking the dog.  He does yard work.  The patient denies any new symptoms such as chest discomfort, neck or arm discomfort. There has been no new shortness of breath, PND or orthopnea. There have been no reported palpitations, presyncope or syncope.    Past Medical History:  Diagnosis Date   Atrial fibrillation ([place])    post op after CABG; tx with amiodarone   Coronary artery disease    a. s/p NSTEMI 12/12 => CABG;  b. inferior STEMI 5/14: 3/3 grafts patent, native CFX 100% => Promus DES    HLD (hyperlipidemia)    HTN (hypertension)    Lumbar disc disease 03/19/2011   Psoriasis 03/22/2011   S/P CABG (coronary artery bypass graft)    12/12 with Dr. Cyndia Bent: LIMA-LAD, SVG-RI, SVG-OM   Sciatica 03/19/2011    Past Surgical History:  Procedure Laterality Date   CORONARY ARTERY BYPASS GRAFT  01/19/2011   Procedure: CORONARY ARTERY BYPASS GRAFTING (CABG);  Surgeon: Gaye Pollack, MD;  Location: [place];  Service: Open Heart Surgery;  Laterality: N/A;   LEFT HEART CATH N/A 07/07/2012   Procedure: LEFT HEART CATH;  Surgeon: Peter M Martinique, MD;  Location: [facility] CATH LAB;  Service: Cardiovascular;  Laterality: N/A;   LEFT HEART CATHETERIZATION WITH CORONARY ANGIOGRAM N/A 01/13/2011   Procedure: LEFT HEART CATHETERIZATION WITH CORONARY ANGIOGRAM;  Surgeon: Peter M Martinique, MD;  Location: [facility] CATH LAB;   Service: Cardiovascular;  Laterality: N/A;   PERCUTANEOUS CORONARY STENT INTERVENTION (PCI-S)  07/07/2012   Procedure: PERCUTANEOUS CORONARY STENT INTERVENTION (PCI-S);  Surgeon: Peter M Martinique, MD;  Location: [facility] CATH LAB;  Service: Cardiovascular;;   TONSILLECTOMY       Current Outpatient Medications  Medication Sig Dispense Refill   aspirin EC 81 MG tablet Take 81 mg by mouth at bedtime.     levothyroxine (SYNTHROID) 75 MCG tablet Take 1 tablet (75 mcg total) by mouth daily. 90 tablet 3   metoprolol tartrate (LOPRESSOR) 25 MG tablet 1 tab by mouth twice per day 180 tablet 3   nitroGLYCERIN (NITROSTAT) 0.4 MG SL tablet Place 1 tablet (0.4 mg total) under the tongue every 5 (five) minutes as needed for chest pain. 25 tablet 3   rosuvastatin (CRESTOR) 40 MG tablet Take 1 tablet (40 mg total) by mouth daily. 90 tablet 3   No current facility-administered medications for this visit.    Allergies:   Dilaudid [hydromorphone hcl]    ROS:  Please see the history of present illness.   Otherwise, review of systems are positive for none.   All other systems are reviewed and negative.    PHYSICAL EXAM: VS:  BP 136/78   Pulse 61   Ht 5\' 10"  (1.778 m)   Wt 195 lb (  88.5 kg)   SpO2 97%   BMI 27.98 kg/m  , BMI Body mass index is 27.98 kg/m. GENERAL:  Well appearing NECK:  No jugular venous distention, waveform within normal limits, carotid upstroke brisk and symmetric, no bruits, no thyromegaly LUNGS:  Clear to auscultation bilaterally CHEST:  Well healed sternotomy scar. HEART:  PMI not displaced or sustained,S1 and S2 within normal limits, no S3, no S4, no clicks, no rubs, no murmurs ABD:  Flat, positive bowel sounds normal in frequency in pitch, no bruits, no rebound, no guarding, no midline pulsatile mass, no hepatomegaly, no splenomegaly EXT:  2 plus pulses throughout, no edema, no cyanosis no clubbing  EKG:  EKG is  ordered today. The ekg ordered today demonstrates sinus rhythm, rate  61, axis within normal limits, intervals within normal limits, no acute ST-T wave changes.   Recent Labs: 07/09/2020: ALT 20; BUN 14; Creatinine, Ser 1.18; Hemoglobin 14.9; Platelets 161.0; Potassium 4.8; Sodium 140; TSH 5.68    Lipid Panel    Component Value Date/Time   CHOL 102 07/09/2020 0811   TRIG 81.0 07/09/2020 0811   HDL 34.80 (L) 07/09/2020 0811   CHOLHDL 3 07/09/2020 0811   VLDL 16.2 07/09/2020 0811   LDLCALC 51 07/09/2020 0811      Wt Readings from Last 3 Encounters:  01/15/21 195 lb (88.5 kg)  07/14/20 193 lb (87.5 kg)  11/19/19 190 lb (86.2 kg)      Other studies Reviewed: Additional studies/ records that were reviewed today include: Labs. Review of the above records demonstrates:  Please see elsewhere in the note.     ASSESSMENT AND PLAN:  CAD:  The patient has no new sypmtoms.  No further cardiovascular testing is indicated.  We will continue with aggressive risk reduction and meds as listed.  HTN: His blood pressure is controlled.  No change in therapy   DYSLIPIDEMIA: His LDL is 51 with an HDL of 34.  No change in therapy.   Current medicines are reviewed at length with the patient today.  The patient does not have concerns regarding medicines.  The following changes have been made:  None  Labs/ tests ordered today include:  None  Orders Placed This Encounter  Procedures   EKG 12-Lead      Disposition:   FU with me in one year.   Signed, Minus Breeding, MD  01/15/2021 9:45 AM    [place]

## 2021-01-15 ENCOUNTER — Other Ambulatory Visit: Payer: Self-pay

## 2021-01-15 ENCOUNTER — Ambulatory Visit (INDEPENDENT_AMBULATORY_CARE_PROVIDER_SITE_OTHER): Payer: Medicare HMO | Admitting: Internal Medicine

## 2021-01-15 ENCOUNTER — Ambulatory Visit: Payer: Medicare HMO | Admitting: Cardiology

## 2021-01-15 ENCOUNTER — Encounter: Payer: Self-pay | Admitting: Cardiology

## 2021-01-15 VITALS — BP 136/78 | HR 61 | Ht 70.0 in | Wt 195.0 lb

## 2021-01-15 DIAGNOSIS — I10 Essential (primary) hypertension: Secondary | ICD-10-CM

## 2021-01-15 DIAGNOSIS — E7800 Pure hypercholesterolemia, unspecified: Secondary | ICD-10-CM | POA: Diagnosis not present

## 2021-01-15 DIAGNOSIS — R739 Hyperglycemia, unspecified: Secondary | ICD-10-CM

## 2021-01-15 DIAGNOSIS — L989 Disorder of the skin and subcutaneous tissue, unspecified: Secondary | ICD-10-CM

## 2021-01-15 DIAGNOSIS — I2510 Atherosclerotic heart disease of native coronary artery without angina pectoris: Secondary | ICD-10-CM

## 2021-01-15 DIAGNOSIS — E785 Hyperlipidemia, unspecified: Secondary | ICD-10-CM

## 2021-01-15 NOTE — Patient Instructions (Signed)
Medication Instructions:  Your Physician recommend you continue on your current medication as directed.    *If you need a refill on your cardiac medications before your next appointment, please call your pharmacy*    Follow-Up: At [facility], you and your health needs are our priority.  As part of our continuing mission to provide you with exceptional heart care, we have created designated Provider Care Teams.  These Care Teams include your primary Cardiologist (physician) and Advanced Practice Providers (APPs -  Physician Assistants and Nurse Practitioners) who all work together to provide you with the care you need, when you need it.  We recommend signing up for the patient portal called "MyChart".  Sign up information is provided on this After Visit Summary.  MyChart is used to connect with patients for Virtual Visits (Telemedicine).  Patients are able to view lab/test results, encounter notes, upcoming appointments, etc.  Non-urgent messages can be sent to your provider as well.   To learn more about what you can do with MyChart, go to [email or web address].    Your next appointment:   1 year(s)  The format for your next appointment:   In Person  Provider:   Minus Breeding, MD

## 2021-01-15 NOTE — Progress Notes (Signed)
Patient ID: Jonathon [name], male   DOB: 08-26-1945, 75 y.o.   MRN: [id number]        Chief Complaint: follow up right face lesion       HPI:  Jonathon [name] is a 74 y.o. male here with c/o new onset 4 wks right facial lesion rapidly enlarging mild tender with some bloody d/c when palpated , wondering about an abscess but no fever or pus.  Pt denies chest pain, increased sob or doe, wheezing, orthopnea, PND, increased LE swelling, palpitations, dizziness or syncope.   Pt denies polydipsia, polyuria, or new focal neuro s/s.   Pt denies fever, wt loss, night sweats, loss of appetite, or other constitutional symptoms          Wt Readings from Last 3 Encounters:  01/15/21 195 lb (88.5 kg)  07/14/20 193 lb (87.5 kg)  11/19/19 190 lb (86.2 kg)   BP Readings from Last 3 Encounters:  01/15/21 136/78  07/14/20 120/62  11/19/19 125/66         Past Medical History:  Diagnosis Date   Atrial fibrillation ([place])    post op after CABG; tx with amiodarone   Coronary artery disease    a. s/p NSTEMI 12/12 => CABG;  b. inferior STEMI 5/14: 3/3 grafts patent, native CFX 100% => Promus DES    HLD (hyperlipidemia)    HTN (hypertension)    Lumbar disc disease 03/19/2011   Psoriasis 03/22/2011   S/P CABG (coronary artery bypass graft)    12/12 with Dr. Cyndia Bent: LIMA-LAD, SVG-RI, SVG-OM   Sciatica 03/19/2011   Past Surgical History:  Procedure Laterality Date   CORONARY ARTERY BYPASS GRAFT  01/19/2011   Procedure: CORONARY ARTERY BYPASS GRAFTING (CABG);  Surgeon: Gaye Pollack, MD;  Location: [facility];  Service: Open Heart Surgery;  Laterality: N/A;   LEFT HEART CATH N/A 07/07/2012   Procedure: LEFT HEART CATH;  Surgeon: Peter M Martinique, MD;  Location: [facility] CATH LAB;  Service: Cardiovascular;  Laterality: N/A;   LEFT HEART CATHETERIZATION WITH CORONARY ANGIOGRAM N/A 01/13/2011   Procedure: LEFT HEART CATHETERIZATION WITH CORONARY ANGIOGRAM;  Surgeon: Peter M Martinique, MD;  Location: [facility] CATH LAB;  Service:  Cardiovascular;  Laterality: N/A;   PERCUTANEOUS CORONARY STENT INTERVENTION (PCI-S)  07/07/2012   Procedure: PERCUTANEOUS CORONARY STENT INTERVENTION (PCI-S);  Surgeon: Peter M Martinique, MD;  Location: [facility] CATH LAB;  Service: Cardiovascular;;   TONSILLECTOMY      reports that he quit smoking about 52 years ago. His smoking use included cigarettes. He has never used smokeless tobacco. He reports that he does not drink alcohol and does not use drugs. family history includes Cancer in his mother; Colon cancer in his mother; Heart disease in his father and mother; Hyperlipidemia in his father. Allergies  Allergen Reactions   Dilaudid [Hydromorphone Hcl] Hypertension    Back and forth with hyper and hypotension   Current Outpatient Medications on File Prior to Visit  Medication Sig Dispense Refill   aspirin EC 81 MG tablet Take 81 mg by mouth at bedtime.     levothyroxine (SYNTHROID) 75 MCG tablet Take 1 tablet (75 mcg total) by mouth daily. 90 tablet 3   metoprolol tartrate (LOPRESSOR) 25 MG tablet 1 tab by mouth twice per day 180 tablet 3   nitroGLYCERIN (NITROSTAT) 0.4 MG SL tablet Place 1 tablet (0.4 mg total) under the tongue every 5 (five) minutes as needed for chest pain. 25 tablet 3   rosuvastatin (CRESTOR) 40 MG  tablet Take 1 tablet (40 mg total) by mouth daily. 90 tablet 3   No current facility-administered medications on file prior to visit.        ROS:  All others reviewed and negative.  Objective        PE:  There were no vitals taken for this visit.                Constitutional: Pt appears in NAD               HENT: Head: NCAT.                Right Ear: External ear normal.                 Left Ear: External ear normal.                Eyes: . Pupils are equal, round, and reactive to light. Conjunctivae and EOM are normal               Nose: without d/c or deformity               Neck: Neck supple. Gross normal ROM               Cardiovascular: Normal rate and regular rhythm.                  Pulmonary/Chest: Effort normal and breath sounds without rales or wheezing.                               Neurological: Pt is alert. At baseline orientation, motor grossly intact               Skin: Skin is warm. LE edema - none; right face preauricular area with > 1 cm raised round pink/red lesion with some slight blood d/c on palpation, minimally tender, firm                Psychiatric: Pt behavior is normal without agitation   Micro: none  Cardiac tracings I have personally interpreted today:  none  Pertinent Radiological findings (summarize): none   Lab Results  Component Value Date   WBC 7.1 07/09/2020   HGB 14.9 07/09/2020   HCT 43.3 07/09/2020   PLT 161.0 07/09/2020   GLUCOSE 109 (H) 07/09/2020   CHOL 102 07/09/2020   TRIG 81.0 07/09/2020   HDL 34.80 (L) 07/09/2020   LDLCALC 51 07/09/2020   ALT 20 07/09/2020   AST 31 07/09/2020   NA 140 07/09/2020   K 4.8 07/09/2020   CL 105 07/09/2020   CREATININE 1.18 07/09/2020   BUN 14 07/09/2020   CO2 30 07/09/2020   TSH 5.68 (H) 07/09/2020   PSA 1.81 07/09/2020   INR 1.95 (H) 07/07/2012   HGBA1C 5.9 07/09/2020   Assessment/Plan:  Jonathon [name] is a 75 y.o. White or Caucasian [1] male with  has a past medical history of Atrial fibrillation ([place]), Coronary artery disease, HLD (hyperlipidemia), HTN (hypertension), Lumbar disc disease (03/19/2011), Psoriasis (03/22/2011), S/P CABG (coronary artery bypass graft), and Sciatica (03/19/2011).  Skin lesion of face Lesion appaers to be c/w rapidly developing basal cell ca - for derm referral  Hyperglycemia Lab Results  Component Value Date   HGBA1C 5.9 07/09/2020   Stable, pt to continue current medical treatment  - diet    Hypertension BP Readings from Last 3 Encounters:  01/15/21 136/78  07/14/20 120/62  11/19/19 125/66   Stable, pt to continue medical treatment lopresor  Hyperlipidemia Lab Results  Component Value Date   LDLCALC 51 07/09/2020    Stable, pt to continue current statin crestor  Followup: Return if symptoms worsen or fail to improve.  Cathlean Cower, MD 01/21/2021 11:01 PM [facility]

## 2021-01-15 NOTE — Patient Instructions (Signed)
You have what appears to be a probable Basal Cell Cancer in the area in front of the right ear  You will be contacted regarding the referral for: [facility]  Please continue all other medications as before, and refills have been done if requested.  Please have the pharmacy call with any other refills you may need.  Please continue your efforts at being more active, low cholesterol diet, and weight control.  Please keep your appointments with your specialists as you may have planned

## 2021-01-21 ENCOUNTER — Encounter: Payer: Self-pay | Admitting: Internal Medicine

## 2021-01-21 DIAGNOSIS — L989 Disorder of the skin and subcutaneous tissue, unspecified: Secondary | ICD-10-CM | POA: Insufficient documentation

## 2021-01-21 NOTE — Assessment & Plan Note (Signed)
Lesion appaers to be c/w rapidly developing basal cell ca - for derm referral

## 2021-01-21 NOTE — Assessment & Plan Note (Signed)
BP Readings from Last 3 Encounters:  01/15/21 136/78  07/14/20 120/62  11/19/19 125/66   Stable, pt to continue medical treatment lopresor

## 2021-01-21 NOTE — Assessment & Plan Note (Signed)
Lab Results  Component Value Date   LDLCALC 51 07/09/2020   Stable, pt to continue current statin crestor

## 2021-01-21 NOTE — Assessment & Plan Note (Signed)
Lab Results  Component Value Date   HGBA1C 5.9 07/09/2020   Stable, pt to continue current medical treatment  - diet

## 2021-02-03 DIAGNOSIS — C44329 Squamous cell carcinoma of skin of other parts of face: Secondary | ICD-10-CM | POA: Diagnosis not present

## 2021-02-03 DIAGNOSIS — C44319 Basal cell carcinoma of skin of other parts of face: Secondary | ICD-10-CM | POA: Diagnosis not present

## 2021-03-23 DIAGNOSIS — Z481 Encounter for planned postprocedural wound closure: Secondary | ICD-10-CM | POA: Diagnosis not present

## 2021-03-23 DIAGNOSIS — L02424 Furuncle of left upper limb: Secondary | ICD-10-CM | POA: Diagnosis not present

## 2021-03-23 DIAGNOSIS — C4492 Squamous cell carcinoma of skin, unspecified: Secondary | ICD-10-CM | POA: Diagnosis not present

## 2021-03-23 DIAGNOSIS — C44329 Squamous cell carcinoma of skin of other parts of face: Secondary | ICD-10-CM | POA: Diagnosis not present

## 2021-04-21 DIAGNOSIS — L905 Scar conditions and fibrosis of skin: Secondary | ICD-10-CM | POA: Diagnosis not present

## 2021-04-21 DIAGNOSIS — L570 Actinic keratosis: Secondary | ICD-10-CM | POA: Diagnosis not present

## 2021-07-06 ENCOUNTER — Encounter: Payer: Self-pay | Admitting: Internal Medicine

## 2021-07-09 ENCOUNTER — Other Ambulatory Visit (INDEPENDENT_AMBULATORY_CARE_PROVIDER_SITE_OTHER): Payer: Medicare HMO

## 2021-07-09 DIAGNOSIS — Z0000 Encounter for general adult medical examination without abnormal findings: Secondary | ICD-10-CM | POA: Diagnosis not present

## 2021-07-09 DIAGNOSIS — E559 Vitamin D deficiency, unspecified: Secondary | ICD-10-CM | POA: Diagnosis not present

## 2021-07-09 DIAGNOSIS — E538 Deficiency of other specified B group vitamins: Secondary | ICD-10-CM | POA: Diagnosis not present

## 2021-07-09 DIAGNOSIS — R739 Hyperglycemia, unspecified: Secondary | ICD-10-CM

## 2021-07-09 DIAGNOSIS — E7800 Pure hypercholesterolemia, unspecified: Secondary | ICD-10-CM

## 2021-07-09 LAB — HEPATIC FUNCTION PANEL
ALT: 23 U/L (ref 0–53)
AST: 32 U/L (ref 0–37)
Albumin: 4.2 g/dL (ref 3.5–5.2)
Alkaline Phosphatase: 44 U/L (ref 39–117)
Bilirubin, Direct: 0.3 mg/dL (ref 0.0–0.3)
Total Bilirubin: 1.2 mg/dL (ref 0.2–1.2)
Total Protein: 6.7 g/dL (ref 6.0–8.3)

## 2021-07-09 LAB — LIPID PANEL
Cholesterol: 109 mg/dL (ref 0–200)
HDL: 38.5 mg/dL — ABNORMAL LOW (ref >39.00)
LDL Cholesterol: 54 mg/dL (ref 0–99)
NonHDL: 70.53
Total CHOL/HDL Ratio: 3
Triglycerides: 84 mg/dL (ref 0.0–149.0)
VLDL: 16.8 mg/dL (ref 0.0–40.0)

## 2021-07-09 LAB — VITAMIN B12: Vitamin B-12: 377 pg/mL (ref 211–911)

## 2021-07-09 LAB — URINALYSIS, ROUTINE W REFLEX MICROSCOPIC
Bilirubin Urine: NEGATIVE
Hgb urine dipstick: NEGATIVE
Ketones, ur: NEGATIVE
Leukocytes,Ua: NEGATIVE
Nitrite: NEGATIVE
RBC / HPF: NONE SEEN (ref 0–?)
Specific Gravity, Urine: <=1.005 — AB (ref 1.000–1.030)
Total Protein, Urine: NEGATIVE
Urine Glucose: NEGATIVE
Urobilinogen, UA: 0.2 (ref 0.0–1.0)
pH: 6 (ref 5.0–8.0)

## 2021-07-09 LAB — CBC WITH DIFFERENTIAL/PLATELET
Basophils Absolute: 0 10*3/uL (ref 0.0–0.1)
Basophils Relative: 0.6 % (ref 0.0–3.0)
Eosinophils Absolute: 0.1 10*3/uL (ref 0.0–0.7)
Eosinophils Relative: 2.1 % (ref 0.0–5.0)
HCT: 43.6 % (ref 39.0–52.0)
Hemoglobin: 14.9 g/dL (ref 13.0–17.0)
Lymphocytes Relative: 28.7 % (ref 12.0–46.0)
Lymphs Abs: 1.9 10*3/uL (ref 0.7–4.0)
MCHC: 34.2 g/dL (ref 30.0–36.0)
MCV: 91.2 fl (ref 78.0–100.0)
Monocytes Absolute: 0.7 10*3/uL (ref 0.1–1.0)
Monocytes Relative: 10 % (ref 3.0–12.0)
Neutro Abs: 4 10*3/uL (ref 1.4–7.7)
Neutrophils Relative %: 58.6 % (ref 43.0–77.0)
Platelets: 160 10*3/uL (ref 150.0–400.0)
RBC: 4.79 Mil/uL (ref 4.22–5.81)
RDW: 13.4 % (ref 11.5–15.5)
WBC: 6.8 10*3/uL (ref 4.0–10.5)

## 2021-07-09 LAB — PSA: PSA: 1.72 ng/mL (ref 0.10–4.00)

## 2021-07-09 LAB — BASIC METABOLIC PANEL
BUN: 17 mg/dL (ref 6–23)
CO2: 28 mEq/L (ref 19–32)
Calcium: 9.2 mg/dL (ref 8.4–10.5)
Chloride: 103 mEq/L (ref 96–112)
Creatinine, Ser: 1.33 mg/dL (ref 0.40–1.50)
GFR: 52.2 mL/min — ABNORMAL LOW (ref >60.00)
Glucose, Bld: 101 mg/dL — ABNORMAL HIGH (ref 70–99)
Potassium: 4.9 mEq/L (ref 3.5–5.1)
Sodium: 140 mEq/L (ref 135–145)

## 2021-07-09 LAB — VITAMIN D 25 HYDROXY (VIT D DEFICIENCY, FRACTURES): VITD: 44.09 ng/mL (ref 30.00–100.00)

## 2021-07-09 LAB — TSH: TSH: 3.43 u[IU]/mL (ref 0.35–5.50)

## 2021-07-09 LAB — HEMOGLOBIN A1C: Hgb A1c MFr Bld: 6.1 % (ref 4.6–6.5)

## 2021-07-15 ENCOUNTER — Encounter: Payer: Self-pay | Admitting: Internal Medicine

## 2021-07-15 ENCOUNTER — Ambulatory Visit (INDEPENDENT_AMBULATORY_CARE_PROVIDER_SITE_OTHER): Payer: Medicare HMO | Admitting: Internal Medicine

## 2021-07-15 VITALS — BP 128/70 | HR 58 | Temp 98.2°F | Ht 70.0 in | Wt 195.0 lb

## 2021-07-15 DIAGNOSIS — E039 Hypothyroidism, unspecified: Secondary | ICD-10-CM

## 2021-07-15 DIAGNOSIS — R739 Hyperglycemia, unspecified: Secondary | ICD-10-CM

## 2021-07-15 DIAGNOSIS — Z0001 Encounter for general adult medical examination with abnormal findings: Secondary | ICD-10-CM | POA: Diagnosis not present

## 2021-07-15 DIAGNOSIS — I10 Essential (primary) hypertension: Secondary | ICD-10-CM

## 2021-07-15 DIAGNOSIS — E7800 Pure hypercholesterolemia, unspecified: Secondary | ICD-10-CM | POA: Diagnosis not present

## 2021-07-15 DIAGNOSIS — N289 Disorder of kidney and ureter, unspecified: Secondary | ICD-10-CM | POA: Diagnosis not present

## 2021-07-15 MED ORDER — METOPROLOL TARTRATE 25 MG PO TABS
ORAL_TABLET | ORAL | 3 refills | Status: DC
Start: 2021-07-15 — End: 2022-07-29

## 2021-07-15 MED ORDER — LEVOTHYROXINE SODIUM 75 MCG PO TABS
75.0000 ug | ORAL_TABLET | Freq: Every day | ORAL | 3 refills | Status: DC
Start: 2021-07-15 — End: 2022-07-29

## 2021-07-15 MED ORDER — ROSUVASTATIN CALCIUM 40 MG PO TABS: 40.0000 mg | ORAL_TABLET | Freq: Every day | ORAL | 3 refills | Status: DC

## 2021-07-15 NOTE — Patient Instructions (Signed)

## 2021-07-15 NOTE — Progress Notes (Signed)
Patient ID: Jonathon [name], male   DOB: Nov 23, 1945, 76 y.o.   MRN: [id number]         Chief Complaint:: wellness exam and low thyroid, htn, hld, hyperglycemia       HPI:  Jonathon [name] is a 76 y.o. male here for wellness exam; pt plans to call soon for f/u colonoscopy.  O/w up to date                        Also Pt denies chest pain, increased sob or doe, wheezing, orthopnea, PND, increased LE swelling, palpitations, dizziness or syncope.   Pt denies polydipsia, polyuria, or new focal neuro s/s.    Pt denies fever, wt loss, night sweats, loss of appetite, or other constitutional symptoms  Denies hyper or hypo thyroid symptoms such as voice, skin or hair change.  No other new complaints   Wt Readings from Last 3 Encounters:  07/15/21 195 lb (88.5 kg)  01/15/21 195 lb (88.5 kg)  07/14/20 193 lb (87.5 kg)   BP Readings from Last 3 Encounters:  07/15/21 128/70  01/15/21 136/78  07/14/20 120/62   Immunization History  Administered Date(s) Administered   Pneumococcal Conjugate-13 06/13/2013   Pneumococcal Polysaccharide-23 03/22/2011   Td 06/26/2014   Zoster Recombinat (Shingrix) 08/03/2017, 12/17/2017  There are no preventive care reminders to display for this patient.    Past Medical History:  Diagnosis Date   Atrial fibrillation ([place])    post op after CABG; tx with amiodarone   Coronary artery disease    a. s/p NSTEMI 12/12 => CABG;  b. inferior STEMI 5/14: 3/3 grafts patent, native CFX 100% => Promus DES    HLD (hyperlipidemia)    HTN (hypertension)    Lumbar disc disease 03/19/2011   Psoriasis 03/22/2011   S/P CABG (coronary artery bypass graft)    12/12 with Dr. Cyndia Bent: LIMA-LAD, SVG-RI, SVG-OM   Sciatica 03/19/2011   Past Surgical History:  Procedure Laterality Date   CORONARY ARTERY BYPASS GRAFT  01/19/2011   Procedure: CORONARY ARTERY BYPASS GRAFTING (CABG);  Surgeon: Gaye Pollack, MD;  Location: [facility];  Service: Open Heart Surgery;  Laterality: N/A;   LEFT HEART CATH  N/A 07/07/2012   Procedure: LEFT HEART CATH;  Surgeon: Peter M Martinique, MD;  Location: [facility] CATH LAB;  Service: Cardiovascular;  Laterality: N/A;   LEFT HEART CATHETERIZATION WITH CORONARY ANGIOGRAM N/A 01/13/2011   Procedure: LEFT HEART CATHETERIZATION WITH CORONARY ANGIOGRAM;  Surgeon: Peter M Martinique, MD;  Location: [facility] CATH LAB;  Service: Cardiovascular;  Laterality: N/A;   PERCUTANEOUS CORONARY STENT INTERVENTION (PCI-S)  07/07/2012   Procedure: PERCUTANEOUS CORONARY STENT INTERVENTION (PCI-S);  Surgeon: Peter M Martinique, MD;  Location: [facility] CATH LAB;  Service: Cardiovascular;;   TONSILLECTOMY      reports that he quit smoking about 53 years ago. His smoking use included cigarettes. He has never used smokeless tobacco. He reports that he does not drink alcohol and does not use drugs. family history includes Cancer in his mother; Colon cancer in his mother; Heart disease in his father and mother; Hyperlipidemia in his father. Allergies  Allergen Reactions   Dilaudid [Hydromorphone Hcl] Hypertension    Back and forth with hyper and hypotension   Current Outpatient Medications on File Prior to Visit  Medication Sig Dispense Refill   aspirin EC 81 MG tablet Take 81 mg by mouth at bedtime.     nitroGLYCERIN (NITROSTAT) 0.4 MG SL tablet Place  1 tablet (0.4 mg total) under the tongue every 5 (five) minutes as needed for chest pain. 25 tablet 3   No current facility-administered medications on file prior to visit.        ROS:  All others reviewed and negative.  Objective        PE:  BP 128/70 (BP Location: Right Arm, Patient Position: Sitting, Cuff Size: Large)   Pulse (!) 58   Temp 98.2 F (36.8 C) (Oral)   Ht '5\' 10"'$  (1.778 m)   Wt 195 lb (88.5 kg)   SpO2 98%   BMI 27.98 kg/m                 Constitutional: Pt appears in NAD               HENT: Head: NCAT.                Right Ear: External ear normal.                 Left Ear: External ear normal.                Eyes: . Pupils are equal,  round, and reactive to light. Conjunctivae and EOM are normal               Nose: without d/c or deformity               Neck: Neck supple. Gross normal ROM               Cardiovascular: Normal rate and regular rhythm.                 Pulmonary/Chest: Effort normal and breath sounds without rales or wheezing.                Abd:  Soft, NT, ND, + BS, no organomegaly               Neurological: Pt is alert. At baseline orientation, motor grossly intact               Skin: Skin is warm. No rashes, no other new lesions, LE edema - none               Psychiatric: Pt behavior is normal without agitation   Micro: none  Cardiac tracings I have personally interpreted today:  none  Pertinent Radiological findings (summarize): none   Lab Results  Component Value Date   WBC 6.8 07/09/2021   HGB 14.9 07/09/2021   HCT 43.6 07/09/2021   PLT 160.0 07/09/2021   GLUCOSE 101 (H) 07/09/2021   CHOL 109 07/09/2021   TRIG 84.0 07/09/2021   HDL 38.50 (L) 07/09/2021   LDLCALC 54 07/09/2021   ALT 23 07/09/2021   AST 32 07/09/2021   NA 140 07/09/2021   K 4.9 07/09/2021   CL 103 07/09/2021   CREATININE 1.33 07/09/2021   BUN 17 07/09/2021   CO2 28 07/09/2021   TSH 3.43 07/09/2021   PSA 1.72 07/09/2021   INR 1.95 (H) 07/07/2012   HGBA1C 6.1 07/09/2021   Assessment/Plan:  Jonathon [name] is a 76 y.o. White or Caucasian [1] male with  has a past medical history of Atrial fibrillation ([place]), Coronary artery disease, HLD (hyperlipidemia), HTN (hypertension), Lumbar disc disease (03/19/2011), Psoriasis (03/22/2011), S/P CABG (coronary artery bypass graft), and Sciatica (03/19/2011).  Encounter for well adult exam with abnormal findings Age and sex appropriate education and counseling updated with regular  exercise and diet Referrals for preventative services - pt to call for colnoscopy Immunizations addressed - none needed Smoking counseling  - none needed Evidence for depression or other mood disorder - none  significant Most recent labs reviewed. I have personally reviewed and have noted: 1) the patient's medical and social history 2) The patient's current medications and supplements 3) The patient's height, weight, and BMI have been recorded in the chart   Hypothyroidism Lab Results  Component Value Date   TSH 3.43 07/09/2021   Stable, pt to continue levothyroxine 75 mcg qd   Hypertension BP Readings from Last 3 Encounters:  07/15/21 128/70  01/15/21 136/78  07/14/20 120/62   Stable, pt to continue medical treatment lopressor  Current Outpatient Medications (Endocrine & Metabolic):    levothyroxine (SYNTHROID) 75 MCG tablet, Take 1 tablet (75 mcg total) by mouth daily.  Current Outpatient Medications (Cardiovascular):    nitroGLYCERIN (NITROSTAT) 0.4 MG SL tablet, Place 1 tablet (0.4 mg total) under the tongue every 5 (five) minutes as needed for chest pain.   metoprolol tartrate (LOPRESSOR) 25 MG tablet, 1 tab by mouth twice per day   rosuvastatin (CRESTOR) 40 MG tablet, Take 1 tablet (40 mg total) by mouth daily.   Current Outpatient Medications (Analgesics):    aspirin EC 81 MG tablet, Take 81 mg by mouth at bedtime.    Hyperlipidemia Lab Results  Component Value Date   LDLCALC 54 07/09/2021   Stable, pt to continue current statin crestor 40 qd   Hyperglycemia Lab Results  Component Value Date   HGBA1C 6.1 07/09/2021   Stable, pt to continue current medical treatment  - diet, wt control, excercise   Renal insufficiency New onset mild, ? New ckd 3 - encouraged pt for contd good hydration, and f/u lab next visit  Followup: Return in about 1 year (around 07/16/2022).  Cathlean Cower, MD 07/18/2021 2:34 PM [facility]

## 2021-07-18 ENCOUNTER — Encounter: Payer: Self-pay | Admitting: Internal Medicine

## 2021-07-18 DIAGNOSIS — N289 Disorder of kidney and ureter, unspecified: Secondary | ICD-10-CM | POA: Insufficient documentation

## 2021-07-18 DIAGNOSIS — N183 Chronic kidney disease, stage 3 unspecified: Secondary | ICD-10-CM | POA: Insufficient documentation

## 2021-07-18 NOTE — Assessment & Plan Note (Signed)
Age and sex appropriate education and counseling updated with regular exercise and diet Referrals for preventative services - pt to call for colnoscopy Immunizations addressed - none needed Smoking counseling  - none needed Evidence for depression or other mood disorder - none significant Most recent labs reviewed. I have personally reviewed and have noted: 1) the patient's medical and social history 2) The patient's current medications and supplements 3) The patient's height, weight, and BMI have been recorded in the chart

## 2021-07-18 NOTE — Assessment & Plan Note (Signed)
Lab Results  Component Value Date   LDLCALC 54 07/09/2021   Stable, pt to continue current statin crestor 40 qd

## 2021-07-18 NOTE — Assessment & Plan Note (Signed)
BP Readings from Last 3 Encounters:  07/15/21 128/70  01/15/21 136/78  07/14/20 120/62   Stable, pt to continue medical treatment lopressor  Current Outpatient Medications (Endocrine & Metabolic):  .  levothyroxine (SYNTHROID) 75 MCG tablet, Take 1 tablet (75 mcg total) by mouth daily.  Current Outpatient Medications (Cardiovascular):  .  nitroGLYCERIN (NITROSTAT) 0.4 MG SL tablet, Place 1 tablet (0.4 mg total) under the tongue every 5 (five) minutes as needed for chest pain. .  metoprolol tartrate (LOPRESSOR) 25 MG tablet, 1 tab by mouth twice per day .  rosuvastatin (CRESTOR) 40 MG tablet, Take 1 tablet (40 mg total) by mouth daily.   Current Outpatient Medications (Analgesics):  .  aspirin EC 81 MG tablet, Take 81 mg by mouth at bedtime.

## 2021-07-18 NOTE — Assessment & Plan Note (Signed)
New onset mild, ? New ckd 3 - encouraged pt for contd good hydration, and f/u lab next visit

## 2021-07-18 NOTE — Assessment & Plan Note (Signed)
Lab Results  Component Value Date   TSH 3.43 07/09/2021   Stable, pt to continue levothyroxine 75 mcg qd

## 2021-07-18 NOTE — Assessment & Plan Note (Signed)
Lab Results  Component Value Date   HGBA1C 6.1 07/09/2021   Stable, pt to continue current medical treatment  - diet, wt control, excercise

## 2021-07-22 DIAGNOSIS — L814 Other melanin hyperpigmentation: Secondary | ICD-10-CM | POA: Diagnosis not present

## 2021-07-22 DIAGNOSIS — D1801 Hemangioma of skin and subcutaneous tissue: Secondary | ICD-10-CM | POA: Diagnosis not present

## 2021-07-22 DIAGNOSIS — L570 Actinic keratosis: Secondary | ICD-10-CM | POA: Diagnosis not present

## 2021-07-22 DIAGNOSIS — L821 Other seborrheic keratosis: Secondary | ICD-10-CM | POA: Diagnosis not present

## 2021-12-13 ENCOUNTER — Encounter: Payer: Self-pay | Admitting: Cardiology

## 2022-01-08 NOTE — Progress Notes (Signed)
Cardiology Office Note   Date:  01/10/2022   ID:  MIRACLE CRIADO, DOB [birth year], MRN [id number]  PCP:  Biagio Borg, MD  Cardiologist:   Minus Breeding, MD   Chief Complaint  Patient presents with   Coronary Artery Disease     History of Present Illness: Jonathon [name] is a 76 y.o. male who presents for followup after bypass surgery and subsequent circ stenting.  He had an occluded native CFX treated with a Promus premier DES in May of 2014.  He had a negative stress test in 2018.    Since I last saw him he has done well.  The patient denies any new symptoms such as chest discomfort, neck or arm discomfort. There has been no new shortness of breath, PND or orthopnea. There have been no reported palpitations, presyncope or syncope.  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  With level of activity he denies any cardiovascular symptoms.    Past Medical History:  Diagnosis Date   Atrial fibrillation ([place])    post op after CABG; tx with amiodarone   Coronary artery disease    a. s/p NSTEMI 12/12 => CABG;  b. inferior STEMI 5/14: 3/3 grafts patent, native CFX 100% => Promus DES    HLD (hyperlipidemia)    HTN (hypertension)    Lumbar disc disease 03/19/2011   Psoriasis 03/22/2011   S/P CABG (coronary artery bypass graft)    12/12 with Dr. Cyndia Bent: LIMA-LAD, SVG-RI, SVG-OM   Sciatica 03/19/2011    Past Surgical History:  Procedure Laterality Date   CORONARY ARTERY BYPASS GRAFT  01/19/2011   Procedure: CORONARY ARTERY BYPASS GRAFTING (CABG);  Surgeon: Gaye Pollack, MD;  Location: [place];  Service: Open Heart Surgery;  Laterality: N/A;   LEFT HEART CATH N/A 07/07/2012   Procedure: LEFT HEART CATH;  Surgeon: Peter M Martinique, MD;  Location: [facility] CATH LAB;  Service: Cardiovascular;  Laterality: N/A;   LEFT HEART CATHETERIZATION WITH CORONARY ANGIOGRAM N/A 01/13/2011   Procedure: LEFT HEART CATHETERIZATION WITH CORONARY ANGIOGRAM;  Surgeon: Peter M Martinique, MD;  Location[facility] C[facility] LAB;   Service: Cardiovascular;  Laterality: N/A;   PERCUTANEOUS CORONARY STENT INTERVENTION (PCI-S)  07/07/2012   Procedure: PERCUTANEOUS CORONARY STENT INTERVENTION (PCI-S);  Surgeon: Peter M Martinique, MD;  Location: [facility] CATH LAB;  Service: Cardiovascular;;   TONSILLECTOMY       Current Outpatient Medications  Medication Sig Dispense Refill   aspirin EC 81 MG tablet Take 81 mg by mouth at bedtime.     levothyroxine (SYNTHROID) 75 MCG tablet Take 1 tablet (75 mcg total) by mouth daily. 90 tablet 3   metoprolol tartrate (LOPRESSOR) 25 MG tablet 1 tab by mouth twice per day 180 tablet 3   nitroGLYCERIN (NITROSTAT) 0.4 MG SL tablet Place 1 tablet (0.4 mg total) under the tongue every 5 (five) minutes as needed for chest pain. 25 tablet 3   rosuvastatin (CRESTOR) 40 MG tablet Take 1 tablet (40 mg total) by mouth daily. 90 tablet 3   No current facility-administered medications for this visit.    Allergies:   Dilaudid [hydromorphone hcl]    ROS:  Please see the history of present illness.   Otherwise, review of systems are positive for none.   All other systems are reviewed and negative.    PHYSICAL EXAM: VS:  BP 126/78   Pulse 65   Ht '5\' 10"'$  (1.778 m)   Wt 193 lb 12.8 oz (87.9  kg)   SpO2 98%   BMI 27.81 kg/m  , BMI Body mass index is 27.81 kg/m. GENERAL:  Well appearing NECK:  No jugular venous distention, waveform within normal limits, carotid upstroke brisk and symmetric, no bruits, no thyromegaly LUNGS:  Clear to auscultation bilaterally CHEST:  Well healed sternotomy scar. HEART:  PMI not displaced or sustained,S1 and S2 within normal limits, no S3, no S4, no clicks, no rubs, no murmurs ABD:  Flat, positive bowel sounds normal in frequency in pitch, no bruits, no rebound, no guarding, no midline pulsatile mass, no hepatomegaly, no splenomegaly EXT:  2 plus pulses throughout, no edema, no cyanosis no clubbing    EKG:  EKG is  ordered today. The ekg ordered today demonstrates sinus  rhythm, rate 65, axis within normal limits, intervals within normal limits, no acute ST-T wave changes.   Recent Labs: 07/09/2021: ALT 23; BUN 17; Creatinine, Ser 1.33; Hemoglobin 14.9; Platelets 160.0; Potassium 4.9; Sodium 140; TSH 3.43    Lipid Panel    Component Value Date/Time   CHOL 109 07/09/2021 0805   TRIG 84.0 07/09/2021 0805   HDL 38.50 (L) 07/09/2021 0805   CHOLHDL 3 07/09/2021 0805   VLDL 16.8 07/09/2021 0805   LDLCALC 54 07/09/2021 0805      Wt Readings from Last 3 Encounters:  01/10/22 193 lb 12.8 oz (87.9 kg)  07/15/21 195 lb (88.5 kg)  01/15/21 195 lb (88.5 kg)      Other studies Reviewed: Additional studies/ records that were reviewed today include: Labs Review of the above records demonstrates:  Please see elsewhere in the note.     ASSESSMENT AND PLAN:  CAD:   The patient has no new sypmtoms.  No further cardiovascular testing is indicated.  We will continue with aggressive risk reduction and meds as listed.  HTN: His blood pressure is at target.  No change in therapy.   DYSLIPIDEMIA: His LDL is 54, HDL 109.  No change in therapy.    Current medicines are reviewed at length with the patient today.  The patient does not have concerns regarding medicines.  The following changes have been made:  None  Labs/ tests ordered today include:   None  Orders Placed This Encounter  Procedures   EKG 12-Lead      Disposition:   FU with me in one year.   Signed, Minus Breeding, MD  01/10/2022 8:41 AM    [facility]

## 2022-01-10 ENCOUNTER — Encounter: Payer: Self-pay | Admitting: Cardiology

## 2022-01-10 ENCOUNTER — Ambulatory Visit: Payer: Medicare HMO | Attending: Cardiology | Admitting: Cardiology

## 2022-01-10 VITALS — BP 126/78 | HR 65 | Ht 70.0 in | Wt 193.8 lb

## 2022-01-10 DIAGNOSIS — E785 Hyperlipidemia, unspecified: Secondary | ICD-10-CM

## 2022-01-10 DIAGNOSIS — I2510 Atherosclerotic heart disease of native coronary artery without angina pectoris: Secondary | ICD-10-CM

## 2022-01-10 DIAGNOSIS — I10 Essential (primary) hypertension: Secondary | ICD-10-CM | POA: Diagnosis not present

## 2022-01-10 NOTE — Patient Instructions (Signed)
Medication Instructions:  Your physician recommends that you continue on your current medications as directed. Please refer to the Current Medication list given to you today.  *If you need a refill on your cardiac medications before your next appointment, please call your pharmacy*  Follow-Up: At  [facility], you and your health needs are our priority.  As part of our continuing mission to provide you with exceptional heart care, we have created designated Provider Care Teams.  These Care Teams include your primary Cardiologist (physician) and Advanced Practice Providers (APPs -  Physician Assistants and Nurse Practitioners) who all work together to provide you with the care you need, when you need it.  We recommend signing up for the patient portal called "MyChart".  Sign up information is provided on this After Visit Summary.  MyChart is used to connect with patients for Virtual Visits (Telemedicine).  Patients are able to view lab/test results, encounter notes, upcoming appointments, etc.  Non-urgent messages can be sent to your provider as well.   To learn more about what you can do with MyChart, go to [email or web address].    Your next appointment:   12 month(s)  The format for your next appointment:   In Person  Provider:   James Hochrein, MD      

## 2022-01-18 ENCOUNTER — Ambulatory Visit: Payer: Medicare HMO

## 2022-01-21 DIAGNOSIS — L570 Actinic keratosis: Secondary | ICD-10-CM | POA: Diagnosis not present

## 2022-01-21 DIAGNOSIS — D1801 Hemangioma of skin and subcutaneous tissue: Secondary | ICD-10-CM | POA: Diagnosis not present

## 2022-01-21 DIAGNOSIS — L814 Other melanin hyperpigmentation: Secondary | ICD-10-CM | POA: Diagnosis not present

## 2022-01-21 DIAGNOSIS — Z08 Encounter for follow-up examination after completed treatment for malignant neoplasm: Secondary | ICD-10-CM | POA: Diagnosis not present

## 2022-01-21 DIAGNOSIS — L821 Other seborrheic keratosis: Secondary | ICD-10-CM | POA: Diagnosis not present

## 2022-01-21 DIAGNOSIS — Z85828 Personal history of other malignant neoplasm of skin: Secondary | ICD-10-CM | POA: Diagnosis not present

## 2022-01-26 ENCOUNTER — Ambulatory Visit (INDEPENDENT_AMBULATORY_CARE_PROVIDER_SITE_OTHER): Payer: Medicare HMO

## 2022-01-26 VITALS — Ht 70.0 in | Wt 193.0 lb

## 2022-01-26 DIAGNOSIS — Z0000 Encounter for general adult medical examination without abnormal findings: Secondary | ICD-10-CM

## 2022-01-26 NOTE — Progress Notes (Signed)
Virtual Visit via Telephone Note  I connected with  [place] on 01/26/22 at  2:30 PM EST by telephone and verified that I am speaking with the correct person using two identifiers.  Location: Patient: Home Provider: [place] Persons participating in the virtual visit: [place]   I discussed the limitations, risks, security and privacy concerns of performing an evaluation and management service by telephone and the availability of in person appointments. The patient expressed understanding and agreed to proceed.  Interactive audio and video telecommunications were attempted between this nurse and patient, however failed, due to patient having technical difficulties OR patient did not have access to video capability.  We continued and completed visit with audio only.  Some vital signs may be absent or patient reported.   Sheral Flow, LPN  Subjective:   Jonathon [name] is a 76 y.o. male who presents for an Initial Medicare Annual Wellness Visit.  Review of Systems     Cardiac Risk Factors include: advanced age (>24mn, >>19women);family history of premature cardiovascular disease;dyslipidemia;hypertension;male gender     Objective:    Today's Vitals   01/26/22 1433  Weight: 193 lb (87.5 kg)  Height: '5\' 10"'$  (1.778 m)  PainSc: 0-No pain   Body mass index is 27.69 kg/m.     01/26/2022    2:36 PM 08/10/2015   10:00 AM 07/07/2012    6:00 PM 01/19/2011    5:00 AM 01/15/2011    8:01 AM 01/13/2011    2:25 PM  Advanced Directives  Does Patient Have a Medical Advance Directive? No No Patient would not like information;Patient does not have advance directive Patient does not have advance directive Patient does not have advance directive Patient does not have advance directive;Patient would not like information  Would patient like information on creating a medical advance directive? No - Patient declined       Pre-existing out of facility DNR  order (yellow form or pink MOST form)   No No No     Current Medications (verified) Outpatient Encounter Medications as of 01/26/2022  Medication Sig   aspirin EC 81 MG tablet Take 81 mg by mouth at bedtime.   levothyroxine (SYNTHROID) 75 MCG tablet Take 1 tablet (75 mcg total) by mouth daily.   metoprolol tartrate (LOPRESSOR) 25 MG tablet 1 tab by mouth twice per day   nitroGLYCERIN (NITROSTAT) 0.4 MG SL tablet Place 1 tablet (0.4 mg total) under the tongue every 5 (five) minutes as needed for chest pain.   rosuvastatin (CRESTOR) 40 MG tablet Take 1 tablet (40 mg total) by mouth daily.   No facility-administered encounter medications on file as of 01/26/2022.    Allergies (verified) Dilaudid [hydromorphone hcl]   History: Past Medical History:  Diagnosis Date   [place]    post op after CABG; tx with amiodarone   Coronary artery disease    a. s/p NSTEMI 12/12 => CABG;  b. inferior STEMI 5/14: 3/3 grafts patent, native CFX 100% => Promus DES    HLD (hyperlipidemia)    HTN (hypertension)    Lumbar disc disease 03/19/2011   Psoriasis 03/22/2011   S/P CABG (coronary artery bypass graft)    12/12 with Dr. BCyndia Bent LIMA-LAD, SVG-RI, SVG-OM   Sciatica 03/19/2011   Past Surgical History:  Procedure Laterality Date   CORONARY ARTERY BYPASS GRAFT  01/19/2011   Procedure: CORONARY ARTERY BYPASS GRAFTING (CABG);  Surgeon: BGaye Pollack MD;  [place]  Service:  Open Heart Surgery;  Laterality: N/A;   LEFT HEART CATH N/A 07/07/2012   Procedure: LEFT HEART CATH;  Surgeon: Peter M Martinique, MD;  [facility];  Service: Cardiovascular;  Laterality: N/A;   LEFT HEART CATHETERIZATION WITH CORONARY ANGIOGRAM N/A 01/13/2011   Procedure: LEFT HEART CATHETERIZATION WITH CORONARY ANGIOGRAM;  Surgeon: Peter M Martinique, MD;  Location: [facility] CATH LAB;  Service: Cardiovascular;  Laterality: N/A;   PERCUTANEOUS CORONARY STENT INTERVENTION (PCI-S)  07/07/2012   Procedure: PERCUTANEOUS  CORONARY STENT INTERVENTION (PCI-S);  Surgeon: Peter M Martinique, MD;  Location: [facility] CATH LAB;  Service: Cardiovascular;;   TONSILLECTOMY     Family History  Problem Relation Age of Onset   Cancer Mother        colon   Heart disease Mother    Colon cancer Mother    Hyperlipidemia Father    Heart disease Father    Social History   Socioeconomic History   Marital status: Married    Spouse name: Not on file   Number of children: Not on file   Years of education: Not on file   Highest education level: Not on file  Occupational History   Not on file  Tobacco Use   Smoking status: Former    Types: Cigarettes    Quit date: 05/12/1968    Years since quitting: 53.7   Smokeless tobacco: Never  Substance and Sexual Activity   Alcohol use: No    Alcohol/week: 0.0 standard drinks of alcohol    Comment: occasional   Drug use: No   Sexual activity: Yes  Other Topics Concern   Not on file  Social History Narrative   Not on file   Social Determinants of Health   Financial Resource Strain: Low Risk  (01/26/2022)   Overall Financial Resource Strain (CARDIA)    Difficulty of Paying Living Expenses: Not hard at all  Food Insecurity: No Food Insecurity (01/26/2022)   Hunger Vital Sign    Worried About Running Out of Food in the Last Year: Never true    [place] in the Last Year: Never true  Transportation Needs: No Transportation Needs (01/26/2022)   PRAPARE - Hydrologist (Medical): No    Lack of Transportation (Non-Medical): No  Physical Activity: Sufficiently Active (01/26/2022)   Exercise Vital Sign    Days of Exercise per Week: 7 days    Minutes of Exercise per Session: 30 min  Stress: No Stress Concern Present (01/26/2022)   [place]    Feeling of Stress : Not at all  Social Connections: [place] (01/26/2022)   Social Connection and Isolation Panel [NHANES]     Frequency of Communication with Friends and Family: More than three times a week    Frequency of Social Gatherings with Friends and Family: More than three times a week    Attends Religious Services: More than 4 times per year    Active Member of [employer] or Organizations: Yes    Attends Music therapist: More than 4 times per year    Marital Status: Married    Tobacco Counseling Counseling given: Not Answered   Clinical Intake:  Pre-visit preparation completed: Yes  Pain : No/denies pain Pain Score: 0-No pain     BMI - recorded: 27.69 Nutritional Status: BMI 25 -29 Overweight Nutritional Risks: None Diabetes: No  How often do you need to have someone help you when you  read instructions, pamphlets, or other written materials from your doctor or pharmacy?: 1 - Never What is the last grade level you completed in school?: HSG  Diabetic? No  Interpreter Needed?: No  Information entered by :: Lisette Abu, LPN.   Activities of Daily Living    01/26/2022    2:42 PM  In your present state of health, do you have any difficulty performing the following activities:  Hearing? 0  Vision? 0  Difficulty concentrating or making decisions? 0  Walking or climbing stairs? 0  Dressing or bathing? 0  Doing errands, shopping? 0  Preparing Food and eating ? N  Using the Toilet? N  In the past six months, have you accidently leaked urine? N  Do you have problems with loss of bowel control? N  Managing your Medications? N  Managing your Finances? N  Housekeeping or managing your Housekeeping? N    Patient Care Team: Biagio Borg, MD as PCP - General (Internal Medicine) Minus Breeding, MD as PCP - Cardiology (Cardiology)  Indicate any recent Medical Services you may have received from other than [facility] providers in the past year (date may be approximate).     Assessment:   This is a routine wellness examination for Skye.  Hearing/Vision screen Hearing  Screening - Comments:: Denies hearing difficulties   Vision Screening - Comments:: Patient wears readers for fine print.  No regular eye doctor.  Dietary issues and exercise activities discussed: Current Exercise Habits: Home exercise routine, Type of exercise: walking, Time (Minutes): 30, Frequency (Times/Week): 7, Weekly Exercise (Minutes/Week): 210, Exercise limited by: None identified   Goals Addressed             This Visit's Progress    Client understands the importance of follow-up with providers by attending scheduled visits.       "Stay Alive"      Depression Screen    01/26/2022    2:41 PM 07/15/2021    8:14 AM 07/15/2021    8:02 AM 07/14/2020   11:35 AM 07/14/2020   10:56 AM 07/11/2019    8:23 AM 07/11/2019    8:11 AM  PHQ 2/9 Scores  PHQ - 2 Score 0 0 0 0 0 0 0  PHQ- 9 Score   0        Fall Risk    01/26/2022    2:36 PM 07/15/2021    8:14 AM 07/15/2021    8:02 AM 07/14/2020   11:34 AM 07/14/2020   10:56 AM  Fall Risk   Falls in the past year? 0 0 0 0 0  Number falls in past yr: 0 0 0 0 0  Injury with Fall? 0 0 0 0 0  Risk for fall due to : No Fall Risks      Follow up Falls prevention discussed        FALL RISK PREVENTION PERTAINING TO THE HOME:  Any stairs in or around the home? No  If so, are there any without handrails? No  Home free of loose throw rugs in walkways, pet beds, electrical cords, etc? Yes  Adequate lighting in your home to reduce risk of falls? Yes   ASSISTIVE DEVICES UTILIZED TO PREVENT FALLS:  Life alert? No  Use of a cane, walker or w/c? No  Grab bars in the bathroom? No  Shower chair or bench in shower? No  Elevated toilet seat or a handicapped toilet? No   TIMED UP AND GO: PHONE VISIT  Was the  test performed? No .   Cognitive Function:        01/26/2022    2:43 PM  6CIT Screen  What Year? 0 points  What month? 0 points  What time? 0 points  Count back from 20 0 points  Months in reverse 0 points  Repeat phrase 0  points  Total Score 0 points    Immunizations Immunization History  Administered Date(s) Administered   Pneumococcal Conjugate-13 06/13/2013   Pneumococcal Polysaccharide-23 03/22/2011   Td 06/26/2014   Zoster Recombinat (Shingrix) 08/03/2017, 12/17/2017    TDAP status: Up to date  Flu Vaccine status: Declined, Education has been provided regarding the importance of this vaccine but patient still declined. Advised may receive this vaccine at local pharmacy or Health Dept. Aware to provide a copy of the vaccination record if obtained from local pharmacy or Health Dept. Verbalized acceptance and understanding.  Pneumococcal vaccine status: Up to date  Covid-19 vaccine status: Declined, Education has been provided regarding the importance of this vaccine but patient still declined. Advised may receive this vaccine at local pharmacy or Health Dept.or vaccine clinic. Aware to provide a copy of the vaccination record if obtained from local pharmacy or Health Dept. Verbalized acceptance and understanding.  Qualifies for Shingles Vaccine? Yes   Zostavax completed No   Shingrix Completed?: Yes  Screening Tests Health Maintenance  Topic Date Due   COLONOSCOPY (Pts 45-58yr Insurance coverage will need to be confirmed)  08/09/2020   INFLUENZA VACCINE  Never done   Medicare Annual Wellness (AWV)  01/27/2023   DTaP/Tdap/Td (2 - Tdap) 06/25/2024   Pneumonia Vaccine 76 Years old  Completed   Hepatitis C Screening  Completed   Zoster Vaccines- Shingrix  Completed   HPV VACCINES  Aged Out   COVID-19 Vaccine  Discontinued    Health Maintenance  Health Maintenance Due  Topic Date Due   COLONOSCOPY (Pts 45-434yrInsurance coverage will need to be confirmed)  08/09/2020   INFLUENZA VACCINE  Never done    Colorectal cancer screening: Type of screening: Colonoscopy. Completed 08/10/2015. Repeat every 5 years (Patient will schedule for 2024)  Lung Cancer Screening: (Low Dose CT Chest  recommended if Age 76-80ears, 30 pack-year currently smoking OR have quit w/in 15years.) does not qualify.   Lung Cancer Screening Referral: no  Additional Screening:  Hepatitis C Screening: does qualify; Completed 06/30/2015  Vision Screening: Recommended annual ophthalmology exams for early detection of glaucoma and other disorders of the eye. Is the patient up to date with their annual eye exam?  No  Who is the provider or what is the name of the office in which the patient attends annual eye exams? Patient will schedule for 2024 If pt is not established with a provider, would they like to be referred to a provider to establish care? No .   Dental Screening: Recommended annual dental exams for proper oral hygiene  Community Resource Referral / Chronic Care Management: CRR required this visit?  No   CCM required this visit?  No      Plan:     I have personally reviewed and noted the following in the patient's chart:   Medical and social history Use of alcohol, tobacco or illicit drugs  Current medications and supplements including opioid prescriptions. Patient is not currently taking opioid prescriptions. Functional ability and status Nutritional status Physical activity Advanced directives List of other physicians Hospitalizations, surgeries, and ER visits in previous 12 months Vitals Screenings to include cognitive,  depression, and falls Referrals and appointments  In addition, I have reviewed and discussed with patient certain preventive protocols, quality metrics, and best practice recommendations. A written personalized care plan for preventive services as well as general preventive health recommendations were provided to patient.     Sheral Flow, LPN   [id number]   Nurse Notes: N/A

## 2022-01-26 NOTE — Patient Instructions (Signed)
Jonathon [name] , Thank you for taking time to come for your Medicare Wellness Visit. I appreciate your ongoing commitment to your health goals. Please review the following plan we discussed and let me know if I can assist you in the future.   These are the goals we discussed:  Goals      Client understands the importance of follow-up with providers by attending scheduled visits.     "Stay Alive"        This is a list of the screening recommended for you and due dates:  Health Maintenance  Topic Date Due   Colon Cancer Screening  08/09/2020   Flu Shot  Never done   Medicare Annual Wellness Visit  01/27/2023   DTaP/Tdap/Td vaccine (2 - Tdap) 06/25/2024   Pneumonia Vaccine  Completed   Hepatitis C Screening: USPSTF Recommendation to screen - Ages 39-79 yo.  Completed   Zoster (Shingles) Vaccine  Completed   HPV Vaccine  Aged Out   COVID-19 Vaccine  Discontinued    Advanced directives: No  Conditions/risks identified: Yes  Next appointment: Follow up in one year for your annual wellness visit.   Preventive Care 76 Years and Older, Male  Preventive care refers to lifestyle choices and visits with your health care provider that can promote health and wellness. What does preventive care include? A yearly physical exam. This is also called an annual well check. Dental exams once or twice a year. Routine eye exams. Ask your health care provider how often you should have your eyes checked. Personal lifestyle choices, including: Daily care of your teeth and gums. Regular physical activity. Eating a healthy diet. Avoiding tobacco and drug use. Limiting alcohol use. Practicing safe sex. Taking low doses of aspirin every day. Taking vitamin and mineral supplements as recommended by your health care provider. What happens during an annual well check? The services and screenings done by your health care provider during your annual well check will depend on your age, overall health,  lifestyle risk factors, and family history of disease. Counseling  Your health care provider may ask you questions about your: Alcohol use. Tobacco use. Drug use. Emotional well-being. Home and relationship well-being. Sexual activity. Eating habits. History of falls. Memory and ability to understand (cognition). Work and work Statistician. Screening  You may have the following tests or measurements: Height, weight, and BMI. Blood pressure. Lipid and cholesterol levels. These may be checked every 5 years, or more frequently if you are over 62 years old. Skin check. Lung cancer screening. You may have this screening every year starting at age 19 if you have a 30-pack-year history of smoking and currently smoke or have quit within the past 15 years. Fecal occult blood test (FOBT) of the stool. You may have this test every year starting at age 13. Flexible sigmoidoscopy or colonoscopy. You may have a sigmoidoscopy every 5 years or a colonoscopy every 10 years starting at age 28. Prostate cancer screening. Recommendations will vary depending on your family history and other risks. Hepatitis C blood test. Hepatitis B blood test. Sexually transmitted disease (STD) testing. Diabetes screening. This is done by checking your blood sugar (glucose) after you have not eaten for a while (fasting). You may have this done every 1-3 years. Abdominal aortic aneurysm (AAA) screening. You may need this if you are a current or former smoker. Osteoporosis. You may be screened starting at age 53 if you are at high risk. Talk with your health care provider about  your test results, treatment options, and if necessary, the need for more tests. Vaccines  Your health care provider may recommend certain vaccines, such as: Influenza vaccine. This is recommended every year. Tetanus, diphtheria, and acellular pertussis (Tdap, Td) vaccine. You may need a Td booster every 10 years. Zoster vaccine. You may need this  after age 35. Pneumococcal 13-valent conjugate (PCV13) vaccine. One dose is recommended after age 25. Pneumococcal polysaccharide (PPSV23) vaccine. One dose is recommended after age 64. Talk to your health care provider about which screenings and vaccines you need and how often you need them. This information is not intended to replace advice given to you by your health care provider. Make sure you discuss any questions you have with your health care provider. Document Released: 02/27/2015 Document Revised: 10/21/2015 Document Reviewed: 12/02/2014 Elsevier Interactive Patient Education  2017 [employer] Prevention in the Home Falls can cause injuries. They can happen to people of all ages. There are many things you can do to make your home safe and to help prevent falls. What can I do on the outside of my home? Regularly fix the edges of walkways and driveways and fix any cracks. Remove anything that might make you trip as you walk through a door, such as a raised step or threshold. Trim any bushes or trees on the path to your home. Use bright outdoor lighting. Clear any walking paths of anything that might make someone trip, such as rocks or tools. Regularly check to see if handrails are loose or broken. Make sure that both sides of any steps have handrails. Any raised decks and porches should have guardrails on the edges. Have any leaves, snow, or ice cleared regularly. Use sand or salt on walking paths during winter. Clean up any spills in your garage right away. This includes oil or grease spills. What can I do in the bathroom? Use night lights. Install grab bars by the toilet and in the tub and shower. Do not use towel bars as grab bars. Use non-skid mats or decals in the tub or shower. If you need to sit down in the shower, use a plastic, non-slip stool. Keep the floor dry. Clean up any water that spills on the floor as soon as it happens. Remove soap buildup in the tub or  shower regularly. Attach bath mats securely with double-sided non-slip rug tape. Do not have throw rugs and other things on the floor that can make you trip. What can I do in the bedroom? Use night lights. Make sure that you have a light by your bed that is easy to reach. Do not use any sheets or blankets that are too big for your bed. They should not hang down onto the floor. Have a firm chair that has side arms. You can use this for support while you get dressed. Do not have throw rugs and other things on the floor that can make you trip. What can I do in the kitchen? Clean up any spills right away. Avoid walking on wet floors. Keep items that you use a lot in easy-to-reach places. If you need to reach something above you, use a strong step stool that has a grab bar. Keep electrical cords out of the way. Do not use floor polish or wax that makes floors slippery. If you must use wax, use non-skid floor wax. Do not have throw rugs and other things on the floor that can make you trip. What can I do with  my stairs? Do not leave any items on the stairs. Make sure that there are handrails on both sides of the stairs and use them. Fix handrails that are broken or loose. Make sure that handrails are as long as the stairways. Check any carpeting to make sure that it is firmly attached to the stairs. Fix any carpet that is loose or worn. Avoid having throw rugs at the top or bottom of the stairs. If you do have throw rugs, attach them to the floor with carpet tape. Make sure that you have a light switch at the top of the stairs and the bottom of the stairs. If you do not have them, ask someone to add them for you. What else can I do to help prevent falls? Wear shoes that: Do not have high heels. Have rubber bottoms. Are comfortable and fit you well. Are closed at the toe. Do not wear sandals. If you use a stepladder: Make sure that it is fully opened. Do not climb a closed stepladder. Make  sure that both sides of the stepladder are locked into place. Ask someone to hold it for you, if possible. Clearly mark and make sure that you can see: Any grab bars or handrails. First and last steps. Where the edge of each step is. Use tools that help you move around (mobility aids) if they are needed. These include: Canes. Walkers. Scooters. Crutches. Turn on the lights when you go into a dark area. Replace any light bulbs as soon as they burn out. Set up your furniture so you have a clear path. Avoid moving your furniture around. If any of your floors are uneven, fix them. If there are any pets around you, be aware of where they are. Review your medicines with your doctor. Some medicines can make you feel dizzy. This can increase your chance of falling. Ask your doctor what other things that you can do to help prevent falls. This information is not intended to replace advice given to you by your health care provider. Make sure you discuss any questions you have with your health care provider. Document Released: 11/27/2008 Document Revised: 07/09/2015 Document Reviewed: 03/07/2014 Elsevier Interactive Patient Education  2017 [employer].

## 2022-06-17 ENCOUNTER — Telehealth: Payer: Self-pay | Admitting: Internal Medicine

## 2022-06-17 DIAGNOSIS — E538 Deficiency of other specified B group vitamins: Secondary | ICD-10-CM

## 2022-06-17 DIAGNOSIS — Z125 Encounter for screening for malignant neoplasm of prostate: Secondary | ICD-10-CM

## 2022-06-17 DIAGNOSIS — E559 Vitamin D deficiency, unspecified: Secondary | ICD-10-CM

## 2022-06-17 DIAGNOSIS — E7800 Pure hypercholesterolemia, unspecified: Secondary | ICD-10-CM

## 2022-06-17 DIAGNOSIS — R739 Hyperglycemia, unspecified: Secondary | ICD-10-CM

## 2022-06-17 NOTE — Telephone Encounter (Signed)
Ok labs are done 

## 2022-06-17 NOTE — Telephone Encounter (Signed)
Lab visit scheduled for 5/31 CPE scheduled for 6/5  Please enter lab orders.

## 2022-07-15 ENCOUNTER — Other Ambulatory Visit (INDEPENDENT_AMBULATORY_CARE_PROVIDER_SITE_OTHER): Payer: Medicare HMO

## 2022-07-15 DIAGNOSIS — Z125 Encounter for screening for malignant neoplasm of prostate: Secondary | ICD-10-CM | POA: Diagnosis not present

## 2022-07-15 DIAGNOSIS — E538 Deficiency of other specified B group vitamins: Secondary | ICD-10-CM

## 2022-07-15 DIAGNOSIS — E559 Vitamin D deficiency, unspecified: Secondary | ICD-10-CM

## 2022-07-15 DIAGNOSIS — E7800 Pure hypercholesterolemia, unspecified: Secondary | ICD-10-CM

## 2022-07-15 DIAGNOSIS — R739 Hyperglycemia, unspecified: Secondary | ICD-10-CM

## 2022-07-15 LAB — BASIC METABOLIC PANEL
BUN: 20 mg/dL (ref 6–23)
CO2: 28 mEq/L (ref 19–32)
Calcium: 9.2 mg/dL (ref 8.4–10.5)
Chloride: 104 mEq/L (ref 96–112)
Creatinine, Ser: 1.3 mg/dL (ref 0.40–1.50)
GFR: 53.27 mL/min — ABNORMAL LOW (ref >60.00)
Glucose, Bld: 91 mg/dL (ref 70–99)
Potassium: 4.4 mEq/L (ref 3.5–5.1)
Sodium: 139 mEq/L (ref 135–145)

## 2022-07-15 LAB — URINALYSIS, ROUTINE W REFLEX MICROSCOPIC
Hgb urine dipstick: NEGATIVE
Leukocytes,Ua: NEGATIVE
Nitrite: NEGATIVE
RBC / HPF: NONE SEEN (ref 0–?)
Specific Gravity, Urine: >=1.03 — AB (ref 1.000–1.030)
Total Protein, Urine: NEGATIVE
Urine Glucose: 100 — AB
Urobilinogen, UA: 1 (ref 0.0–1.0)
pH: 5.5 (ref 5.0–8.0)

## 2022-07-15 LAB — CBC WITH DIFFERENTIAL/PLATELET
Basophils Absolute: 0 10*3/uL (ref 0.0–0.1)
Basophils Relative: 0.7 % (ref 0.0–3.0)
Eosinophils Absolute: 0.2 10*3/uL (ref 0.0–0.7)
Eosinophils Relative: 3.5 % (ref 0.0–5.0)
HCT: 43.5 % (ref 39.0–52.0)
Hemoglobin: 14.6 g/dL (ref 13.0–17.0)
Lymphocytes Relative: 28.4 % (ref 12.0–46.0)
Lymphs Abs: 1.7 10*3/uL (ref 0.7–4.0)
MCHC: 33.5 g/dL (ref 30.0–36.0)
MCV: 91.9 fl (ref 78.0–100.0)
Monocytes Absolute: 0.5 10*3/uL (ref 0.1–1.0)
Monocytes Relative: 9.3 % (ref 3.0–12.0)
Neutro Abs: 3.4 10*3/uL (ref 1.4–7.7)
Neutrophils Relative %: 58.1 % (ref 43.0–77.0)
Platelets: 168 10*3/uL (ref 150.0–400.0)
RBC: 4.73 Mil/uL (ref 4.22–5.81)
RDW: 13.6 % (ref 11.5–15.5)
WBC: 5.8 10*3/uL (ref 4.0–10.5)

## 2022-07-15 LAB — HEMOGLOBIN A1C: Hgb A1c MFr Bld: 6 % (ref 4.6–6.5)

## 2022-07-15 LAB — LIPID PANEL
Cholesterol: 106 mg/dL (ref 0–200)
HDL: 36.7 mg/dL — ABNORMAL LOW (ref >39.00)
LDL Cholesterol: 51 mg/dL (ref 0–99)
NonHDL: 69.24
Total CHOL/HDL Ratio: 3
Triglycerides: 89 mg/dL (ref 0.0–149.0)
VLDL: 17.8 mg/dL (ref 0.0–40.0)

## 2022-07-15 LAB — HEPATIC FUNCTION PANEL
ALT: 21 U/L (ref 0–53)
AST: 33 U/L (ref 0–37)
Albumin: 4 g/dL (ref 3.5–5.2)
Alkaline Phosphatase: 42 U/L (ref 39–117)
Bilirubin, Direct: 0.2 mg/dL (ref 0.0–0.3)
Total Bilirubin: 0.9 mg/dL (ref 0.2–1.2)
Total Protein: 6.6 g/dL (ref 6.0–8.3)

## 2022-07-15 LAB — MICROALBUMIN / CREATININE URINE RATIO
Creatinine,U: 236.7 mg/dL
Microalb Creat Ratio: 0.6 mg/g (ref 0.0–30.0)
Microalb, Ur: 1.5 mg/dL (ref 0.0–1.9)

## 2022-07-15 LAB — TSH: TSH: 22.17 u[IU]/mL — ABNORMAL HIGH (ref 0.35–5.50)

## 2022-07-15 LAB — VITAMIN D 25 HYDROXY (VIT D DEFICIENCY, FRACTURES): VITD: 47.9 ng/mL (ref 30.00–100.00)

## 2022-07-15 LAB — VITAMIN B12: Vitamin B-12: 332 pg/mL (ref 211–911)

## 2022-07-15 LAB — PSA: PSA: 3.04 ng/mL (ref 0.10–4.00)

## 2022-07-20 ENCOUNTER — Encounter: Payer: Medicare HMO | Admitting: Internal Medicine

## 2022-07-26 DIAGNOSIS — L814 Other melanin hyperpigmentation: Secondary | ICD-10-CM | POA: Diagnosis not present

## 2022-07-26 DIAGNOSIS — Z08 Encounter for follow-up examination after completed treatment for malignant neoplasm: Secondary | ICD-10-CM | POA: Diagnosis not present

## 2022-07-26 DIAGNOSIS — L570 Actinic keratosis: Secondary | ICD-10-CM | POA: Diagnosis not present

## 2022-07-26 DIAGNOSIS — S50812A Abrasion of left forearm, initial encounter: Secondary | ICD-10-CM | POA: Diagnosis not present

## 2022-07-26 DIAGNOSIS — Z85828 Personal history of other malignant neoplasm of skin: Secondary | ICD-10-CM | POA: Diagnosis not present

## 2022-07-26 DIAGNOSIS — L239 Allergic contact dermatitis, unspecified cause: Secondary | ICD-10-CM | POA: Diagnosis not present

## 2022-07-26 DIAGNOSIS — D225 Melanocytic nevi of trunk: Secondary | ICD-10-CM | POA: Diagnosis not present

## 2022-07-26 DIAGNOSIS — L821 Other seborrheic keratosis: Secondary | ICD-10-CM | POA: Diagnosis not present

## 2022-07-29 ENCOUNTER — Ambulatory Visit (INDEPENDENT_AMBULATORY_CARE_PROVIDER_SITE_OTHER): Payer: Medicare HMO | Admitting: Internal Medicine

## 2022-07-29 VITALS — BP 128/72 | HR 77 | Temp 98.7°F | Ht 70.0 in | Wt 189.0 lb

## 2022-07-29 DIAGNOSIS — N1831 Chronic kidney disease, stage 3a: Secondary | ICD-10-CM

## 2022-07-29 DIAGNOSIS — R739 Hyperglycemia, unspecified: Secondary | ICD-10-CM | POA: Diagnosis not present

## 2022-07-29 DIAGNOSIS — I10 Essential (primary) hypertension: Secondary | ICD-10-CM | POA: Diagnosis not present

## 2022-07-29 DIAGNOSIS — E039 Hypothyroidism, unspecified: Secondary | ICD-10-CM | POA: Diagnosis not present

## 2022-07-29 DIAGNOSIS — E7800 Pure hypercholesterolemia, unspecified: Secondary | ICD-10-CM

## 2022-07-29 DIAGNOSIS — R972 Elevated prostate specific antigen [PSA]: Secondary | ICD-10-CM | POA: Diagnosis not present

## 2022-07-29 DIAGNOSIS — Z0001 Encounter for general adult medical examination with abnormal findings: Secondary | ICD-10-CM | POA: Diagnosis not present

## 2022-07-29 MED ORDER — LEVOTHYROXINE SODIUM 75 MCG PO TABS: 75.0000 ug | ORAL_TABLET | Freq: Every day | ORAL | 3 refills | Status: DC

## 2022-07-29 MED ORDER — METOPROLOL TARTRATE 25 MG PO TABS: ORAL_TABLET | ORAL | 3 refills | Status: DC

## 2022-07-29 MED ORDER — ROSUVASTATIN CALCIUM 40 MG PO TABS: 40.0000 mg | ORAL_TABLET | Freq: Every day | ORAL | 3 refills | Status: DC

## 2022-07-29 MED ORDER — NITROGLYCERIN 0.4 MG SL SUBL: 0.4000 mg | SUBLINGUAL_TABLET | SUBLINGUAL | 3 refills | Status: AC | PRN

## 2022-07-29 NOTE — Patient Instructions (Addendum)
Please remember to call as you can for your follow up colonoscopy.   Please continue all other medications as before, and refills have been done for all 3 meds  Please have the pharmacy call with any other refills you may need.  Please continue your efforts at being more active, low cholesterol diet, and weight control.  You are otherwise up to date with prevention measures today.  Please keep your appointments with your specialists as you may have planned  Please make an Appointment to return in 6 months, or sooner if needed, also with Lab Appointment for testing done 3-5 days before at the FIRST FLOOR Lab (so this is for TWO appointments - please see the scheduling desk as you leave)]

## 2022-07-29 NOTE — Progress Notes (Unsigned)
Patient ID: Jonathon [name], male   DOB: 19-Dec-1945, 77 y.o.   MRN: [id number]         Chief Complaint:: wellness exam and increasing psa velocity, eleavted tsh - low thyroid, ckd3a, htn, hld, hyperglycemia       HPI:  Jonathon [name] is a 77 y.o. male here for wellness exam; plans to have colonoscopy soon, but unable for now as wife is having hip surgury next wk;  o/w up to date                        Also has been out of levothyroxine for the past month, needs refill.  Has lost about 8 lbs in the past month with better diet and more activity.  Pt denies chest pain, increased sob or doe, wheezing, orthopnea, PND, increased LE swelling, palpitations, dizziness or syncope.   Pt denies polydipsia, polyuria, or new focal neuro s/s.    Pt denies fever, wt loss, night sweats, loss of appetite, or other constitutional symptoms  Denies hyper or hypo thyroid symptoms such as voice, skin or hair change.  Denies urinary symptoms such as dysuria, frequency, urgency, flank pain, hematuria or n/v, fever, chills.     Wt Readings from Last 3 Encounters:  07/29/22 189 lb (85.7 kg)  01/26/22 193 lb (87.5 kg)  01/10/22 193 lb 12.8 oz (87.9 kg)   BP Readings from Last 3 Encounters:  07/29/22 128/72  01/10/22 126/78  07/15/21 128/70   Immunization History  Administered Date(s) Administered   Pneumococcal Conjugate-13 06/13/2013   Pneumococcal Polysaccharide-23 03/22/2011   Td 06/26/2014   Zoster Recombinat (Shingrix) 08/03/2017, 12/17/2017   Health Maintenance Due  Topic Date Due   Colonoscopy  08/09/2020      Past Medical History:  Diagnosis Date   Atrial fibrillation [facility])    post op after CABG; tx with amiodarone   Coronary artery disease    a. s/p NSTEMI 12/12 => CABG;  b. inferior STEMI 5/14: 3/3 grafts patent, native CFX 100% => Promus DES    HLD (hyperlipidemia)    HTN (hypertension)    Lumbar disc disease 03/19/2011   Psoriasis 03/22/2011   S/P CABG (coronary artery bypass graft)    12/12  with Dr. Laneta Simmers: LIMA-LAD, SVG-RI, SVG-OM   Sciatica 03/19/2011   Past Surgical History:  Procedure Laterality Date   CORONARY ARTERY BYPASS GRAFT  01/19/2011   Procedure: CORONARY ARTERY BYPASS GRAFTING (CABG);  Surgeon: Alleen Borne, MD;  Location: [facility] OR;  Service: Open Heart Surgery;  Laterality: N/A;   LEFT HEART CATH N/A 07/07/2012   Procedure: LEFT HEART CATH;  Surgeon: Peter M Swaziland, MD;  Location: [facility] CATH LAB;  Service: Cardiovascular;  Laterality: N/A;   LEFT HEART CATHETERIZATION WITH CORONARY ANGIOGRAM N/A 01/13/2011   Procedure: LEFT HEART CATHETERIZATION WITH CORONARY ANGIOGRAM;  Surgeon: Peter M Swaziland, MD;  Location: [facility] CATH LAB;  Service: Cardiovascular;  Laterality: N/A;   PERCUTANEOUS CORONARY STENT INTERVENTION (PCI-S)  07/07/2012   Procedure: PERCUTANEOUS CORONARY STENT INTERVENTION (PCI-S);  Surgeon: Peter M Swaziland, MD;  Location: [facility] CATH LAB;  Service: Cardiovascular;;   TONSILLECTOMY      reports that he quit smoking about 54 years ago. His smoking use included cigarettes. He has never used smokeless tobacco. He reports that he does not drink alcohol and does not use drugs. family history includes Cancer in his mother; Colon cancer in his mother; Heart disease in his father and mother; Hyperlipidemia  in his father. Allergies  Allergen Reactions   Dilaudid [Hydromorphone Hcl] Hypertension    Back and forth with hyper and hypotension   Current Outpatient Medications on File Prior to Visit  Medication Sig Dispense Refill   aspirin EC 81 MG tablet Take 81 mg by mouth at bedtime.     No current facility-administered medications on file prior to visit.        ROS:  All others reviewed and negative.  Objective        PE:  BP 128/72 (BP Location: Left Arm, Patient Position: Sitting, Cuff Size: Normal)   Pulse 77   Temp 98.7 F (37.1 C) (Oral)   Ht 5\' 10"  (1.778 m)   Wt 189 lb (85.7 kg)   SpO2 97%   BMI 27.12 kg/m                 Constitutional: Pt appears in  NAD               HENT: Head: NCAT.                Right Ear: External ear normal.                 Left Ear: External ear normal.                Eyes: . Pupils are equal, round, and reactive to light. Conjunctivae and EOM are normal               Nose: without d/c or deformity               Neck: Neck supple. Gross normal ROM               Cardiovascular: Normal rate and regular rhythm.                 Pulmonary/Chest: Effort normal and breath sounds without rales or wheezing.                Abd:  Soft, NT, ND, + BS, no organomegaly               Neurological: Pt is alert. At baseline orientation, motor grossly intact               Skin: Skin is warm. No rashes, no other new lesions, LE edema - none               Psychiatric: Pt behavior is normal without agitation   Micro: none  Cardiac tracings I have personally interpreted today:  none  Pertinent Radiological findings (summarize): none   Lab Results  Component Value Date   WBC 5.8 07/15/2022   HGB 14.6 07/15/2022   HCT 43.5 07/15/2022   PLT 168.0 07/15/2022   GLUCOSE 91 07/15/2022   CHOL 106 07/15/2022   TRIG 89.0 07/15/2022   HDL 36.70 (L) 07/15/2022   LDLCALC 51 07/15/2022   ALT 21 07/15/2022   AST 33 07/15/2022   NA 139 07/15/2022   K 4.4 07/15/2022   CL 104 07/15/2022   CREATININE 1.30 07/15/2022   BUN 20 07/15/2022   CO2 28 07/15/2022   TSH 22.17 (H) 07/15/2022   PSA 3.04 07/15/2022   INR 1.95 (H) 07/07/2012   HGBA1C 6.0 07/15/2022   MICROALBUR 1.5 07/15/2022   Assessment/Plan:  Jonathon [name] is a 77 y.o. White or Caucasian [1] male with  has a past medical history of Atrial fibrillation (HCC), Coronary artery disease,  HLD (hyperlipidemia), HTN (hypertension), Lumbar disc disease (03/19/2011), Psoriasis (03/22/2011), S/P CABG (coronary artery bypass graft), and Sciatica (03/19/2011).  Encounter for well adult exam with abnormal findings Age and sex appropriate education and counseling updated with regular  exercise and diet Referrals for preventative services - pt states will call for colonoscopy soon Immunizations addressed - none needed Smoking counseling  - none needed Evidence for depression or other mood disorder - none significant Most recent labs reviewed. I have personally reviewed and have noted: 1) the patient's medical and social history 2) The patient's current medications and supplements 3) The patient's height, weight, and BMI have been recorded in the chart   Hypertension BP Readings from Last 3 Encounters:  07/29/22 128/72  01/10/22 126/78  07/15/21 128/70   Stable, pt to continue medical treatment lopressor 25 bid   Hyperlipidemia Lab Results  Component Value Date   LDLCALC 51 07/15/2022   Stable, pt to continue current statin crestor 40 qd   Hypothyroidism Lab Results  Component Value Date   TSH 22.17 (H) 07/15/2022   Uncontrolled after out of med for 1 mo, pt to restart levothyroxine 7 mcg every day and fu lab next visit   Hyperglycemia Lab Results  Component Value Date   HGBA1C 6.0 07/15/2022   Stable, pt to continue current medical treatment  - diet, wt control   Increased prostate specific antigen (PSA) velocity Lab Results  Component Value Date   PSA 3.04 07/15/2022   PSA 1.72 07/09/2021   PSA 1.81 07/09/2020    With increased velocity, will need recheck at 6 mo  Followup: Return in about 6 months (around 01/28/2023).  Oliver Barre, MD 07/30/2022 8:30 PM [facility]

## 2022-07-30 ENCOUNTER — Encounter: Payer: Self-pay | Admitting: Internal Medicine

## 2022-07-30 DIAGNOSIS — R972 Elevated prostate specific antigen [PSA]: Secondary | ICD-10-CM | POA: Insufficient documentation

## 2022-07-30 NOTE — Assessment & Plan Note (Signed)
Lab Results  Component Value Date   LDLCALC 51 07/15/2022   Stable, pt to continue current statin crestor 40 qd

## 2022-07-30 NOTE — Assessment & Plan Note (Signed)
Age and sex appropriate education and counseling updated with regular exercise and diet Referrals for preventative services - pt states will call for colonoscopy soon Immunizations addressed - none needed Smoking counseling  - none needed Evidence for depression or other mood disorder - none significant Most recent labs reviewed. I have personally reviewed and have noted: 1) the patient's medical and social history 2) The patient's current medications and supplements 3) The patient's height, weight, and BMI have been recorded in the chart

## 2022-07-30 NOTE — Assessment & Plan Note (Signed)
Lab Results  Component Value Date   TSH 22.17 (H) 07/15/2022   Uncontrolled after out of med for 1 mo, pt to restart levothyroxine 7 mcg every day and fu lab next visit

## 2022-07-30 NOTE — Assessment & Plan Note (Signed)
Lab Results  Component Value Date   PSA 3.04 07/15/2022   PSA 1.72 07/09/2021   PSA 1.81 07/09/2020    With increased velocity, will need recheck at 6 mo

## 2022-07-30 NOTE — Assessment & Plan Note (Signed)
Lab Results  Component Value Date   CREATININE 1.30 07/15/2022   Stable overall, cont to avoid nephrotoxins

## 2022-07-30 NOTE — Assessment & Plan Note (Signed)
BP Readings from Last 3 Encounters:  07/29/22 128/72  01/10/22 126/78  07/15/21 128/70   Stable, pt to continue medical treatment lopressor 25 bid

## 2022-07-30 NOTE — Assessment & Plan Note (Signed)
Lab Results  Component Value Date   HGBA1C 6.0 07/15/2022   Stable, pt to continue current medical treatment  - diet, wt control

## 2022-12-19 DIAGNOSIS — H25813 Combined forms of age-related cataract, bilateral: Secondary | ICD-10-CM | POA: Diagnosis not present

## 2022-12-19 DIAGNOSIS — H04123 Dry eye syndrome of bilateral lacrimal glands: Secondary | ICD-10-CM | POA: Diagnosis not present

## 2022-12-19 DIAGNOSIS — H43813 Vitreous degeneration, bilateral: Secondary | ICD-10-CM | POA: Diagnosis not present

## 2022-12-19 DIAGNOSIS — H35363 Drusen (degenerative) of macula, bilateral: Secondary | ICD-10-CM | POA: Diagnosis not present

## 2022-12-23 ENCOUNTER — Encounter: Payer: Self-pay | Admitting: Internal Medicine

## 2022-12-23 MED ORDER — AMOXICILLIN 500 MG PO CAPS: 500.0000 mg | ORAL_CAPSULE | Freq: Three times a day (TID) | ORAL | 0 refills | Status: AC

## 2023-01-20 ENCOUNTER — Other Ambulatory Visit (INDEPENDENT_AMBULATORY_CARE_PROVIDER_SITE_OTHER): Payer: Medicare HMO

## 2023-01-20 DIAGNOSIS — R739 Hyperglycemia, unspecified: Secondary | ICD-10-CM | POA: Diagnosis not present

## 2023-01-20 DIAGNOSIS — R972 Elevated prostate specific antigen [PSA]: Secondary | ICD-10-CM

## 2023-01-20 DIAGNOSIS — E7800 Pure hypercholesterolemia, unspecified: Secondary | ICD-10-CM

## 2023-01-20 DIAGNOSIS — E039 Hypothyroidism, unspecified: Secondary | ICD-10-CM

## 2023-01-20 LAB — HEMOGLOBIN A1C: Hgb A1c MFr Bld: 5.9 % (ref 4.6–6.5)

## 2023-01-20 LAB — BASIC METABOLIC PANEL
BUN: 15 mg/dL (ref 6–23)
CO2: 31 meq/L (ref 19–32)
Calcium: 8.8 mg/dL (ref 8.4–10.5)
Chloride: 103 meq/L (ref 96–112)
Creatinine, Ser: 1.16 mg/dL (ref 0.40–1.50)
GFR: 60.85 mL/min (ref >60.00)
Glucose, Bld: 112 mg/dL — ABNORMAL HIGH (ref 70–99)
Potassium: 4.3 meq/L (ref 3.5–5.1)
Sodium: 138 meq/L (ref 135–145)

## 2023-01-20 LAB — HEPATIC FUNCTION PANEL
ALT: 17 U/L (ref 0–53)
AST: 22 U/L (ref 0–37)
Albumin: 3.9 g/dL (ref 3.5–5.2)
Alkaline Phosphatase: 39 U/L (ref 39–117)
Bilirubin, Direct: 0.2 mg/dL (ref 0.0–0.3)
Total Bilirubin: 1 mg/dL (ref 0.2–1.2)
Total Protein: 6.4 g/dL (ref 6.0–8.3)

## 2023-01-20 LAB — LIPID PANEL
Cholesterol: 110 mg/dL (ref 0–200)
HDL: 37.6 mg/dL — ABNORMAL LOW (ref >39.00)
LDL Cholesterol: 57 mg/dL (ref 0–99)
NonHDL: 72.54
Total CHOL/HDL Ratio: 3
Triglycerides: 76 mg/dL (ref 0.0–149.0)
VLDL: 15.2 mg/dL (ref 0.0–40.0)

## 2023-01-20 LAB — TSH: TSH: 3.15 u[IU]/mL (ref 0.35–5.50)

## 2023-01-20 LAB — PSA: PSA: 2.9 ng/mL (ref 0.10–4.00)

## 2023-01-26 ENCOUNTER — Ambulatory Visit: Payer: Medicare HMO

## 2023-01-26 VITALS — Ht 70.0 in | Wt 183.0 lb

## 2023-01-26 DIAGNOSIS — Z08 Encounter for follow-up examination after completed treatment for malignant neoplasm: Secondary | ICD-10-CM | POA: Diagnosis not present

## 2023-01-26 DIAGNOSIS — D229 Melanocytic nevi, unspecified: Secondary | ICD-10-CM | POA: Diagnosis not present

## 2023-01-26 DIAGNOSIS — L570 Actinic keratosis: Secondary | ICD-10-CM | POA: Diagnosis not present

## 2023-01-26 DIAGNOSIS — L821 Other seborrheic keratosis: Secondary | ICD-10-CM | POA: Diagnosis not present

## 2023-01-26 DIAGNOSIS — Z0000 Encounter for general adult medical examination without abnormal findings: Secondary | ICD-10-CM

## 2023-01-26 DIAGNOSIS — Z85828 Personal history of other malignant neoplasm of skin: Secondary | ICD-10-CM | POA: Diagnosis not present

## 2023-01-26 DIAGNOSIS — Z1211 Encounter for screening for malignant neoplasm of colon: Secondary | ICD-10-CM

## 2023-01-26 DIAGNOSIS — L814 Other melanin hyperpigmentation: Secondary | ICD-10-CM | POA: Diagnosis not present

## 2023-01-26 NOTE — Progress Notes (Addendum)
Subjective:   Jonathon [name] is a 77 y.o. male who presents for Medicare Annual/Subsequent preventive examination.  Visit Complete: Virtual I connected with  Amillio Mulcahy Spayd on 01/26/23 by a audio enabled telemedicine application and verified that I am speaking with the correct person using two identifiers.  Patient Location: Home  Provider Location: Office/Clinic  I discussed the limitations of evaluation and management by telemedicine. The patient expressed understanding and agreed to proceed.  Vital Signs: Because this visit was a virtual/telehealth visit, some criteria may be missing or patient reported. Any vitals not documented were not able to be obtained and vitals that have been documented are patient reported.  Patient Medicare AWV questionnaire was completed by the patient on 01/20/2023; I have confirmed that all information answered by patient is correct and no changes since this date.  Cardiac Risk Factors include: advanced age (>33men, >48 women);male gender;Other (see comment);hypertension;dyslipidemia, Risk factor comments: NSTEMI, CAD, A-Fib    Objective:    Today's Vitals   01/26/23 0908  Weight: 183 lb (83 kg)  Height: 5\' 10"  (1.778 m)   Body mass index is 26.26 kg/m.     01/26/2023    9:14 AM 01/26/2022    2:36 PM 08/10/2015   10:00 AM 07/07/2012    6:00 PM 01/19/2011    5:00 AM 01/15/2011    8:01 AM 01/13/2011    2:25 PM  Advanced Directives  Does Patient Have a Medical Advance Directive? No No No Patient would not like information;Patient does not have advance directive Patient does not have advance directive Patient does not have advance directive Patient does not have advance directive;Patient would not like information  Would patient like information on creating a medical advance directive?  No - Patient declined       Pre-existing out of facility DNR order (yellow form or pink MOST form)    No No No     Current Medications (verified) Outpatient  Encounter Medications as of 01/26/2023  Medication Sig   aspirin EC 81 MG tablet Take 81 mg by mouth at bedtime.   levothyroxine (SYNTHROID) 75 MCG tablet Take 1 tablet (75 mcg total) by mouth daily.   metoprolol tartrate (LOPRESSOR) 25 MG tablet 1 tab by mouth twice per day   nitroGLYCERIN (NITROSTAT) 0.4 MG SL tablet Place 1 tablet (0.4 mg total) under the tongue every 5 (five) minutes as needed for chest pain.   rosuvastatin (CRESTOR) 40 MG tablet Take 1 tablet (40 mg total) by mouth daily.   No facility-administered encounter medications on file as of 01/26/2023.    Allergies (verified) Dilaudid [hydromorphone hcl]   History: Past Medical History:  Diagnosis Date   Atrial fibrillation (HCC)    post op after CABG; tx with amiodarone   Coronary artery disease    a. s/p NSTEMI 12/12 => CABG;  b. inferior STEMI 5/14: 3/3 grafts patent, native CFX 100% => Promus DES    HLD (hyperlipidemia)    HTN (hypertension)    Lumbar disc disease 03/19/2011   Psoriasis 03/22/2011   S/P CABG (coronary artery bypass graft)    12/12 with Dr. Laneta Simmers: LIMA-LAD, SVG-RI, SVG-OM   Sciatica 03/19/2011   Past Surgical History:  Procedure Laterality Date   CORONARY ARTERY BYPASS GRAFT  01/19/2011   Procedure: CORONARY ARTERY BYPASS GRAFTING (CABG);  Surgeon: Alleen Borne, MD;  Location: [facility] OR;  Service: Open Heart Surgery;  Laterality: N/A;   LEFT HEART CATH N/A 07/07/2012   Procedure: LEFT HEART  CATH;  Surgeon: Peter M Swaziland, MD;  Location: [facility] CATH LAB;  Service: Cardiovascular;  Laterality: N/A;   LEFT HEART CATHETERIZATION WITH CORONARY ANGIOGRAM N/A 01/13/2011   Procedure: LEFT HEART CATHETERIZATION WITH CORONARY ANGIOGRAM;  Surgeon: Peter M Swaziland, MD;  Location: [facility] CATH LAB;  Service: Cardiovascular;  Laterality: N/A;   PERCUTANEOUS CORONARY STENT INTERVENTION (PCI-S)  07/07/2012   Procedure: PERCUTANEOUS CORONARY STENT INTERVENTION (PCI-S);  Surgeon: Peter M Swaziland, MD;  Location: [facility] CATH LAB;   Service: Cardiovascular;;   TONSILLECTOMY     Family History  Problem Relation Age of Onset   Cancer Mother        colon   Heart disease Mother    Colon cancer Mother    Hyperlipidemia Father    Heart disease Father    Social History   Socioeconomic History   Marital status: Married    Spouse name: Diane   Number of children: Not on file   Years of education: Not on file   Highest education level: 12th grade  Occupational History   Occupation: Retired  Tobacco Use   Smoking status: Former    Current packs/day: 0.00    Types: Cigarettes    Quit date: 05/12/1968    Years since quitting: 54.7   Smokeless tobacco: Never  Vaping Use   Vaping status: Never Used  Substance and Sexual Activity   Alcohol use: No    Alcohol/week: 0.0 standard drinks of alcohol    Comment: occasional   Drug use: No   Sexual activity: Yes  Other Topics Concern   Not on file  Social History Narrative   Lives with wife   Social Drivers of Health   Financial Resource Strain: Low Risk  (01/23/2023)   Overall Financial Resource Strain (CARDIA)    Difficulty of Paying Living Expenses: Not hard at all  Food Insecurity: No Food Insecurity (01/23/2023)   Hunger Vital Sign    Worried About Running Out of Food in the Last Year: Never true    Ran Out of Food in the Last Year: Never true  Transportation Needs: No Transportation Needs (01/23/2023)   PRAPARE - Administrator, Civil Service (Medical): No    Lack of Transportation (Non-Medical): No  Physical Activity: Sufficiently Active (01/23/2023)   Exercise Vital Sign    Days of Exercise per Week: 5 days    Minutes of Exercise per Session: 30 min  Stress: No Stress Concern Present (01/23/2023)   [employer] of Occupational Health - Occupational Stress Questionnaire    Feeling of Stress : Not at all  Social Connections: Moderately Integrated (01/23/2023)   Social Connection and Isolation Panel [NHANES]    Frequency of Communication  with Friends and Family: Three times a week    Frequency of Social Gatherings with Friends and Family: Twice a week    Attends Religious Services: 1 to 4 times per year    Active Member of [employer] or Organizations: No    Attends Engineer, structural: Never    Marital Status: Married    Tobacco Counseling Counseling given: Not Answered   Clinical Intake:  Pre-visit preparation completed: Yes  Pain : No/denies pain     BMI - recorded: 26.26 Nutritional Status: BMI 25 -29 Overweight Nutritional Risks: None Diabetes: No  How often do you need to have someone help you when you read instructions, pamphlets, or other written materials from your doctor or pharmacy?: 1 - Never  Interpreter Needed?: No  Information  entered by :: Zadyn Yardley, RMA   Activities of Daily Living    01/20/2023    5:52 PM  In your present state of health, do you have any difficulty performing the following activities:  Hearing? 0  Vision? 0  Difficulty concentrating or making decisions? 0  Walking or climbing stairs? 0  Dressing or bathing? 0  Doing errands, shopping? 0  Preparing Food and eating ? N  Using the Toilet? N  In the past six months, have you accidently leaked urine? N  Do you have problems with loss of bowel control? N  Managing your Medications? N  Managing your Finances? N  Housekeeping or managing your Housekeeping? N    Patient Care Team: Corwin Levins, MD as PCP - General (Internal Medicine) Rollene Rotunda, MD as PCP - Cardiology (Cardiology)  Indicate any recent Medical Services you may have received from other than [facility] providers in the past year (date may be approximate).     Assessment:   This is a routine wellness examination for Lynken.  Hearing/Vision screen Hearing Screening - Comments:: Denies hearing difficulties   Vision Screening - Comments:: Denies vision issues    Goals Addressed             This Visit's Progress    Client understands  the importance of follow-up with providers by attending scheduled visits.   On track    "Stay Alive"      Depression Screen    01/26/2023    9:17 AM 07/29/2022    8:17 AM 01/26/2022    2:41 PM 07/15/2021    8:14 AM 07/15/2021    8:02 AM 07/14/2020   11:35 AM 07/14/2020   10:56 AM  PHQ 2/9 Scores  PHQ - 2 Score 0 0 0 0 0 0 0  PHQ- 9 Score 0    0      Fall Risk    01/20/2023    5:52 PM 07/29/2022    8:17 AM 01/26/2022    2:36 PM 07/15/2021    8:14 AM 07/15/2021    8:02 AM  Fall Risk   Falls in the past year? 0 0 0 0 0  Number falls in past yr: 0 0 0 0 0  Injury with Fall? 0 0 0 0 0  Risk for fall due to :  No Fall Risks No Fall Risks    Follow up Falls evaluation completed;Falls prevention discussed Falls evaluation completed Falls prevention discussed      MEDICARE RISK AT HOME: Medicare Risk at Home Any stairs in or around the home?: Yes If so, are there any without handrails?: No Home free of loose throw rugs in walkways, pet beds, electrical cords, etc?: Yes Adequate lighting in your home to reduce risk of falls?: Yes Life alert?: No Use of a cane, walker or w/c?: No Grab bars in the bathroom?: No Shower chair or bench in shower?: No Elevated toilet seat or a handicapped toilet?: No  TIMED UP AND GO:  Was the test performed?  No    Cognitive Function:        01/26/2023    9:14 AM 01/26/2022    2:43 PM  6CIT Screen  What Year? 0 points 0 points  What month? 0 points 0 points  What time? 0 points 0 points  Count back from 20 0 points 0 points  Months in reverse 0 points 0 points  Repeat phrase 0 points 0 points  Total Score 0 points 0  points    Immunizations Immunization History  Administered Date(s) Administered   Pneumococcal Conjugate-13 06/13/2013   Pneumococcal Polysaccharide-23 03/22/2011   Td 06/26/2014   Zoster Recombinant(Shingrix) 08/03/2017, 12/17/2017    TDAP status: Up to date  Flu Vaccine status: Declined, Education has been provided  regarding the importance of this vaccine but patient still declined. Advised may receive this vaccine at local pharmacy or Health Dept. Aware to provide a copy of the vaccination record if obtained from local pharmacy or Health Dept. Verbalized acceptance and understanding.  Pneumococcal vaccine status: Up to date  Covid-19 vaccine status: Declined, Education has been provided regarding the importance of this vaccine but patient still declined. Advised may receive this vaccine at local pharmacy or Health Dept.or vaccine clinic. Aware to provide a copy of the vaccination record if obtained from local pharmacy or Health Dept. Verbalized acceptance and understanding.  Qualifies for Shingles Vaccine? Yes   Zostavax completed Yes   Shingrix Completed?: Yes  Screening Tests Health Maintenance  Topic Date Due   Colonoscopy  08/09/2020   INFLUENZA VACCINE  05/15/2023 (Originally 09/15/2022)   Medicare Annual Wellness (AWV)  01/26/2024   DTaP/Tdap/Td (2 - Tdap) 06/25/2024   Pneumonia Vaccine 65+ Years old  Completed   Hepatitis C Screening  Completed   Zoster Vaccines- Shingrix  Completed   HPV VACCINES  Aged Out   COVID-19 Vaccine  Discontinued    Health Maintenance  Health Maintenance Due  Topic Date Due   Colonoscopy  08/09/2020    Colorectal cancer screening: Referral to GI placed 01/26/2023. Pt aware the office will call re: appt.  Lung Cancer Screening: (Low Dose CT Chest recommended if Age 41-80 years, 20 pack-year currently smoking OR have quit w/in 15years.) does not qualify.   Lung Cancer Screening Referral: N/A  Additional Screening:  Hepatitis C Screening: does qualify; Completed 06/30/2015  Vision Screening: Recommended annual ophthalmology exams for early detection of glaucoma and other disorders of the eye. Is the patient up to date with their annual eye exam?  Yes  Who is the provider or what is the name of the office in which the patient attends annual eye exams? Dr.  Elmer Picker If pt is not established with a provider, would they like to be referred to a provider to establish care? No .   Dental Screening: Recommended annual dental exams for proper oral hygiene   Community Resource Referral / Chronic Care Management: CRR required this visit?  No   CCM required this visit?  No     Plan:     I have personally reviewed and noted the following in the patient's chart:   Medical and social history Use of alcohol, tobacco or illicit drugs  Current medications and supplements including opioid prescriptions. Patient is not currently taking opioid prescriptions. Functional ability and status Nutritional status Physical activity Advanced directives List of other physicians Hospitalizations, surgeries, and ER visits in previous 12 months Vitals Screenings to include cognitive, depression, and falls Referrals and appointments  In addition, I have reviewed and discussed with patient certain preventive protocols, quality metrics, and best practice recommendations. A written personalized care plan for preventive services as well as general preventive health recommendations were provided to patient.     Seema Blum L Joory Gough, CMA   01/26/2023   After Visit Summary: (MyChart) Due to this being a telephonic visit, the after visit summary with patients personalized plan was offered to patient via MyChart   Nurse Notes: Patient is due for a  colonoscopy and order has been placed today.  He declines Flu vaccine and Covid vaccines.  Patient is up to date on all other health maintenance with no concerns to address today.

## 2023-01-26 NOTE — Patient Instructions (Addendum)
Jonathon [name] , Thank you for taking time to come for your Medicare Wellness Visit. I appreciate your ongoing commitment to your health goals. Please review the following plan we discussed and let me know if I can assist you in the future.   Referrals/Orders/Follow-Ups/Clinician Recommendations: Keep up the good work. Remember to call [facility], to schedule an appointment for your colonoscopy.   [address], [place], [place] [address].  [phone]  This is a list of the screening recommended for you and due dates:  Health Maintenance  Topic Date Due   Colon Cancer Screening  08/09/2020   Flu Shot  05/15/2023*   Medicare Annual Wellness Visit  01/26/2024   DTaP/Tdap/Td vaccine (2 - Tdap) 06/25/2024   Pneumonia Vaccine  Completed   Hepatitis C Screening  Completed   Zoster (Shingles) Vaccine  Completed   HPV Vaccine  Aged Out   COVID-19 Vaccine  Discontinued  *Topic was postponed. The date shown is not the original due date.    Advanced directives: (Declined) Advance directive discussed with you today. Even though you declined this today, please call our office should you change your mind, and we can give you the proper paperwork for you to fill out.  Next Medicare Annual Wellness Visit scheduled for next year: Yes

## 2023-01-27 ENCOUNTER — Ambulatory Visit (INDEPENDENT_AMBULATORY_CARE_PROVIDER_SITE_OTHER): Payer: Medicare HMO | Admitting: Internal Medicine

## 2023-01-27 ENCOUNTER — Encounter: Payer: Self-pay | Admitting: Internal Medicine

## 2023-01-27 VITALS — BP 118/72 | HR 58 | Temp 98.5°F | Ht 70.0 in | Wt 185.0 lb

## 2023-01-27 DIAGNOSIS — E559 Vitamin D deficiency, unspecified: Secondary | ICD-10-CM | POA: Diagnosis not present

## 2023-01-27 DIAGNOSIS — R739 Hyperglycemia, unspecified: Secondary | ICD-10-CM

## 2023-01-27 DIAGNOSIS — E538 Deficiency of other specified B group vitamins: Secondary | ICD-10-CM

## 2023-01-27 DIAGNOSIS — N1831 Chronic kidney disease, stage 3a: Secondary | ICD-10-CM

## 2023-01-27 DIAGNOSIS — E7800 Pure hypercholesterolemia, unspecified: Secondary | ICD-10-CM | POA: Diagnosis not present

## 2023-01-27 DIAGNOSIS — E039 Hypothyroidism, unspecified: Secondary | ICD-10-CM | POA: Diagnosis not present

## 2023-01-27 DIAGNOSIS — I10 Essential (primary) hypertension: Secondary | ICD-10-CM

## 2023-01-27 DIAGNOSIS — R972 Elevated prostate specific antigen [PSA]: Secondary | ICD-10-CM | POA: Diagnosis not present

## 2023-01-27 NOTE — Patient Instructions (Signed)
Please continue all other medications as before, and refills have been done if requested.  Please have the pharmacy call with any other refills you may need.  Please continue your efforts at being more active, low cholesterol diet, and weight control.  Please keep your appointments with your specialists as you may have planned  Please consider the RSV shot at the pharmacy  Please make an Appointment to return in 6 months, or sooner if needed, also with Lab Appointment for testing done 3-5 days before at the FIRST FLOOR Lab (so this is for TWO appointments - please see the scheduling desk as you leave)

## 2023-01-27 NOTE — Progress Notes (Unsigned)
Patient ID: Jonathon [name], male   DOB: 1945-06-02, 77 y.o.   MRN: [id number]        Chief Complaint: follow up HTN, HLD and hyperglycemia, ckd3a,        HPI:  Jonathon [name] is a 77 y.o. male here overall doing ok,  Pt denies chest pain, increased sob or doe, wheezing, orthopnea, PND, increased LE swelling, palpitations, dizziness or syncope.  Pt denies polydipsia, polyuria, or new focal neuro s/s.    Pt denies fever, wt loss, night sweats, loss of appetite, or other constitutional symptoms   Had covid infection oct 2024.  Has colonoscopy ordered to be scheduled.   Wt Readings from Last 3 Encounters:  01/27/23 185 lb (83.9 kg)  01/26/23 183 lb (83 kg)  07/29/22 189 lb (85.7 kg)   BP Readings from Last 3 Encounters:  01/27/23 118/72  07/29/22 128/72  01/10/22 126/78         Past Medical History:  Diagnosis Date   Atrial fibrillation (HCC)    post op after CABG; tx with amiodarone   Coronary artery disease    a. s/p NSTEMI 12/12 => CABG;  b. inferior STEMI 5/14: 3/3 grafts patent, native CFX 100% => Promus DES    HLD (hyperlipidemia)    HTN (hypertension)    Lumbar disc disease 03/19/2011   Psoriasis 03/22/2011   S/P CABG (coronary artery bypass graft)    12/12 with Dr. Laneta Simmers: LIMA-LAD, SVG-RI, SVG-OM   Sciatica 03/19/2011   Past Surgical History:  Procedure Laterality Date   CORONARY ARTERY BYPASS GRAFT  01/19/2011   Procedure: CORONARY ARTERY BYPASS GRAFTING (CABG);  Surgeon: Alleen Borne, MD;  Location: [facility] OR;  Service: Open Heart Surgery;  Laterality: N/A;   LEFT HEART CATH N/A 07/07/2012   Procedure: LEFT HEART CATH;  Surgeon: Peter M Swaziland, MD;  Location: [facility] CATH LAB;  Service: Cardiovascular;  Laterality: N/A;   LEFT HEART CATHETERIZATION WITH CORONARY ANGIOGRAM N/A 01/13/2011   Procedure: LEFT HEART CATHETERIZATION WITH CORONARY ANGIOGRAM;  Surgeon: Peter M Swaziland, MD;  Location: [facility] C[facility] LAB;  Service: Cardiovascular;  Laterality: N/A;   PERCUTANEOUS CORONARY STENT  INTERVENTION (PCI-S)  07/07/2012   Procedure: PERCUTANEOUS CORONARY STENT INTERVENTION (PCI-S);  Surgeon: Peter M Swaziland, MD;  Location: [facility] CATH LAB;  Service: Cardiovascular;;   TONSILLECTOMY      reports that he quit smoking about 54 years ago. His smoking use included cigarettes. He has never used smokeless tobacco. He reports that he does not drink alcohol and does not use drugs. family history includes Cancer in his mother; Colon cancer in his mother; Heart disease in his father and mother; Hyperlipidemia in his father. Allergies  Allergen Reactions   Dilaudid [Hydromorphone Hcl] Hypertension    Back and forth with hyper and hypotension   Current Outpatient Medications on File Prior to Visit  Medication Sig Dispense Refill   aspirin EC 81 MG tablet Take 81 mg by mouth at bedtime.     levothyroxine (SYNTHROID) 75 MCG tablet Take 1 tablet (75 mcg total) by mouth daily. 90 tablet 3   metoprolol tartrate (LOPRESSOR) 25 MG tablet 1 tab by mouth twice per day 180 tablet 3   nitroGLYCERIN (NITROSTAT) 0.4 MG SL tablet Place 1 tablet (0.4 mg total) under the tongue every 5 (five) minutes as needed for chest pain. 25 tablet 3   rosuvastatin (CRESTOR) 40 MG tablet Take 1 tablet (40 mg total) by mouth daily. 90 tablet 3   No  current facility-administered medications on file prior to visit.        ROS:  All others reviewed and negative.  Objective        PE:  BP 118/72 (BP Location: Right Arm, Patient Position: Sitting, Cuff Size: Normal)   Pulse (!) 58   Temp 98.5 F (36.9 C) (Oral)   Ht 5\' 10"  (1.778 m)   Wt 185 lb (83.9 kg)   SpO2 98%   BMI 26.54 kg/m                 Constitutional: Pt appears in NAD               HENT: Head: NCAT.                Right Ear: External ear normal.                 Left Ear: External ear normal.                Eyes: . Pupils are equal, round, and reactive to light. Conjunctivae and EOM are normal               Nose: without d/c or deformity                Neck: Neck supple. Gross normal ROM               Cardiovascular: Normal rate and regular rhythm.                 Pulmonary/Chest: Effort normal and breath sounds without rales or wheezing.                Abd:  Soft, NT, ND, + BS, no organomegaly               Neurological: Pt is alert. At baseline orientation, motor grossly intact               Skin: Skin is warm. No rashes, no other new lesions, LE edema - none               Psychiatric: Pt behavior is normal without agitation   Micro: none  Cardiac tracings I have personally interpreted today:  none  Pertinent Radiological findings (summarize): none   Lab Results  Component Value Date   WBC 5.8 07/15/2022   HGB 14.6 07/15/2022   HCT 43.5 07/15/2022   PLT 168.0 07/15/2022   GLUCOSE 112 (H) 01/20/2023   CHOL 110 01/20/2023   TRIG 76.0 01/20/2023   HDL 37.60 (L) 01/20/2023   LDLCALC 57 01/20/2023   ALT 17 01/20/2023   AST 22 01/20/2023   NA 138 01/20/2023   K 4.3 01/20/2023   CL 103 01/20/2023   CREATININE 1.16 01/20/2023   BUN 15 01/20/2023   CO2 31 01/20/2023   TSH 3.15 01/20/2023   PSA 2.90 01/20/2023   INR 1.95 (H) 07/07/2012   HGBA1C 5.9 01/20/2023   MICROALBUR 1.5 07/15/2022   Assessment/Plan:  Jonathon [name] is a 77 y.o. White or Caucasian [1] male with  has a past medical history of Atrial fibrillation (HCC), Coronary artery disease, HLD (hyperlipidemia), HTN (hypertension), Lumbar disc disease (03/19/2011), Psoriasis (03/22/2011), S/P CABG (coronary artery bypass graft), and Sciatica (03/19/2011).  Hypothyroidism Lab Results  Component Value Date   TSH 3.15 01/20/2023   Stable, pt to continue levothyroxine 75 mcg qd   Hypertension BP Readings from Last 3 Encounters:  01/27/23 118/72  07/29/22  128/72  01/10/22 126/78   Stable, pt to continue medical treatment lopressor 25 bid   Hyperlipidemia Lab Results  Component Value Date   LDLCALC 57 01/20/2023   Stable, pt to continue current statin crestor  40 qd   Hyperglycemia Lab Results  Component Value Date   HGBA1C 5.9 01/20/2023   Stable, pt to continue current medical treatment  - diet, wt control   CKD (chronic kidney disease) stage 3, GFR 30-59 ml/min (HCC) Lab Results  Component Value Date   CREATININE 1.16 01/20/2023   Stable overall, cont to avoid nephrotoxins  Followup: Return in about 6 months (around 07/28/2023).  Oliver Barre, MD 01/29/2023 9:23 PM [facility]

## 2023-01-29 ENCOUNTER — Encounter: Payer: Self-pay | Admitting: Internal Medicine

## 2023-01-29 NOTE — Assessment & Plan Note (Signed)
Lab Results  Component Value Date   CREATININE 1.16 01/20/2023   Stable overall, cont to avoid nephrotoxins

## 2023-01-29 NOTE — Assessment & Plan Note (Signed)
Lab Results  Component Value Date   HGBA1C 5.9 01/20/2023   Stable, pt to continue current medical treatment  - diet, wt control

## 2023-01-29 NOTE — Assessment & Plan Note (Signed)
BP Readings from Last 3 Encounters:  01/27/23 118/72  07/29/22 128/72  01/10/22 126/78   Stable, pt to continue medical treatment lopressor 25 bid

## 2023-01-29 NOTE — Assessment & Plan Note (Signed)
Lab Results  Component Value Date   LDLCALC 57 01/20/2023   Stable, pt to continue current statin crestor 40 qd

## 2023-01-29 NOTE — Assessment & Plan Note (Signed)
Lab Results  Component Value Date   TSH 3.15 01/20/2023   Stable, pt to continue levothyroxine 75 mcg qd

## 2023-07-24 ENCOUNTER — Encounter: Payer: Self-pay | Admitting: Internal Medicine

## 2023-07-24 ENCOUNTER — Encounter: Payer: Self-pay | Admitting: Cardiology

## 2023-07-27 ENCOUNTER — Other Ambulatory Visit (INDEPENDENT_AMBULATORY_CARE_PROVIDER_SITE_OTHER)

## 2023-07-27 DIAGNOSIS — R739 Hyperglycemia, unspecified: Secondary | ICD-10-CM

## 2023-07-27 DIAGNOSIS — E7800 Pure hypercholesterolemia, unspecified: Secondary | ICD-10-CM | POA: Diagnosis not present

## 2023-07-27 DIAGNOSIS — E538 Deficiency of other specified B group vitamins: Secondary | ICD-10-CM

## 2023-07-27 DIAGNOSIS — E559 Vitamin D deficiency, unspecified: Secondary | ICD-10-CM

## 2023-07-27 DIAGNOSIS — R972 Elevated prostate specific antigen [PSA]: Secondary | ICD-10-CM | POA: Diagnosis not present

## 2023-07-27 LAB — URINALYSIS, ROUTINE W REFLEX MICROSCOPIC
Bilirubin Urine: NEGATIVE
Hgb urine dipstick: NEGATIVE
Ketones, ur: NEGATIVE
Leukocytes,Ua: NEGATIVE
Nitrite: NEGATIVE
RBC / HPF: NONE SEEN (ref 0–?)
Specific Gravity, Urine: 1.015 (ref 1.000–1.030)
Total Protein, Urine: NEGATIVE
Urine Glucose: 100 — AB
Urobilinogen, UA: 0.2 (ref 0.0–1.0)
pH: 6 (ref 5.0–8.0)

## 2023-07-27 LAB — LIPID PANEL
Cholesterol: 105 mg/dL (ref 0–200)
HDL: 32.4 mg/dL — ABNORMAL LOW (ref >39.00)
LDL Cholesterol: 61 mg/dL (ref 0–99)
NonHDL: 72.41
Total CHOL/HDL Ratio: 3
Triglycerides: 59 mg/dL (ref 0.0–149.0)
VLDL: 11.8 mg/dL (ref 0.0–40.0)

## 2023-07-27 LAB — HEPATIC FUNCTION PANEL
ALT: 15 U/L (ref 0–53)
AST: 27 U/L (ref 0–37)
Albumin: 3.9 g/dL (ref 3.5–5.2)
Alkaline Phosphatase: 42 U/L (ref 39–117)
Bilirubin, Direct: 0.2 mg/dL (ref 0.0–0.3)
Total Bilirubin: 0.9 mg/dL (ref 0.2–1.2)
Total Protein: 6.4 g/dL (ref 6.0–8.3)

## 2023-07-27 LAB — CBC WITH DIFFERENTIAL/PLATELET
Basophils Absolute: 0 10*3/uL (ref 0.0–0.1)
Basophils Relative: 0.7 % (ref 0.0–3.0)
Eosinophils Absolute: 0.2 10*3/uL (ref 0.0–0.7)
Eosinophils Relative: 3.6 % (ref 0.0–5.0)
HCT: 41.3 % (ref 39.0–52.0)
Hemoglobin: 14.1 g/dL (ref 13.0–17.0)
Lymphocytes Relative: 26.8 % (ref 12.0–46.0)
Lymphs Abs: 1.5 10*3/uL (ref 0.7–4.0)
MCHC: 34.1 g/dL (ref 30.0–36.0)
MCV: 90.7 fl (ref 78.0–100.0)
Monocytes Absolute: 0.6 10*3/uL (ref 0.1–1.0)
Monocytes Relative: 9.9 % (ref 3.0–12.0)
Neutro Abs: 3.4 10*3/uL (ref 1.4–7.7)
Neutrophils Relative %: 59 % (ref 43.0–77.0)
Platelets: 165 10*3/uL (ref 150.0–400.0)
RBC: 4.55 Mil/uL (ref 4.22–5.81)
RDW: 13.2 % (ref 11.5–15.5)
WBC: 5.7 10*3/uL (ref 4.0–10.5)

## 2023-07-27 LAB — BASIC METABOLIC PANEL WITH GFR
BUN: 19 mg/dL (ref 6–23)
CO2: 28 meq/L (ref 19–32)
Calcium: 8.7 mg/dL (ref 8.4–10.5)
Chloride: 104 meq/L (ref 96–112)
Creatinine, Ser: 1.18 mg/dL (ref 0.40–1.50)
GFR: 59.4 mL/min — ABNORMAL LOW (ref >60.00)
Glucose, Bld: 113 mg/dL — ABNORMAL HIGH (ref 70–99)
Potassium: 4.2 meq/L (ref 3.5–5.1)
Sodium: 138 meq/L (ref 135–145)

## 2023-07-27 LAB — MICROALBUMIN / CREATININE URINE RATIO
Creatinine,U: 143.9 mg/dL
Microalb Creat Ratio: UNDETERMINED mg/g (ref 0.0–30.0)
Microalb, Ur: <0.7 mg/dL

## 2023-07-27 LAB — HEMOGLOBIN A1C: Hgb A1c MFr Bld: 6 % (ref 4.6–6.5)

## 2023-07-28 ENCOUNTER — Ambulatory Visit: Payer: Medicare HMO | Admitting: Internal Medicine

## 2023-07-28 LAB — VITAMIN D 25 HYDROXY (VIT D DEFICIENCY, FRACTURES): VITD: 40.49 ng/mL (ref 30.00–100.00)

## 2023-07-28 LAB — TSH: TSH: 4.1 u[IU]/mL (ref 0.35–5.50)

## 2023-07-28 LAB — VITAMIN B12: Vitamin B-12: 242 pg/mL (ref 211–911)

## 2023-07-28 LAB — PSA: PSA: 3.8 ng/mL (ref 0.10–4.00)

## 2023-07-31 ENCOUNTER — Encounter: Payer: Self-pay | Admitting: Internal Medicine

## 2023-07-31 ENCOUNTER — Ambulatory Visit (INDEPENDENT_AMBULATORY_CARE_PROVIDER_SITE_OTHER): Admitting: Internal Medicine

## 2023-07-31 VITALS — BP 138/80 | HR 76 | Temp 98.2°F | Ht 70.0 in | Wt 187.0 lb

## 2023-07-31 DIAGNOSIS — Z0000 Encounter for general adult medical examination without abnormal findings: Secondary | ICD-10-CM

## 2023-07-31 DIAGNOSIS — E538 Deficiency of other specified B group vitamins: Secondary | ICD-10-CM

## 2023-07-31 DIAGNOSIS — R972 Elevated prostate specific antigen [PSA]: Secondary | ICD-10-CM | POA: Diagnosis not present

## 2023-07-31 DIAGNOSIS — E7800 Pure hypercholesterolemia, unspecified: Secondary | ICD-10-CM | POA: Diagnosis not present

## 2023-07-31 DIAGNOSIS — Z1211 Encounter for screening for malignant neoplasm of colon: Secondary | ICD-10-CM

## 2023-07-31 DIAGNOSIS — I10 Essential (primary) hypertension: Secondary | ICD-10-CM

## 2023-07-31 DIAGNOSIS — R739 Hyperglycemia, unspecified: Secondary | ICD-10-CM | POA: Diagnosis not present

## 2023-07-31 DIAGNOSIS — E559 Vitamin D deficiency, unspecified: Secondary | ICD-10-CM

## 2023-07-31 DIAGNOSIS — Z0001 Encounter for general adult medical examination with abnormal findings: Secondary | ICD-10-CM

## 2023-07-31 MED ORDER — METOPROLOL TARTRATE 25 MG PO TABS: ORAL_TABLET | ORAL | 3 refills | Status: AC

## 2023-07-31 MED ORDER — ROSUVASTATIN CALCIUM 40 MG PO TABS: 40.0000 mg | ORAL_TABLET | Freq: Every day | ORAL | 3 refills | Status: AC

## 2023-07-31 MED ORDER — LEVOTHYROXINE SODIUM 75 MCG PO TABS: 75.0000 ug | ORAL_TABLET | Freq: Every day | ORAL | 3 refills | Status: AC

## 2023-07-31 NOTE — Progress Notes (Unsigned)
 Patient ID: Jonathon [name], male   DOB: [birth year], 78 y.o.   MRN: [id number]         Chief Complaint:: wellness exam and htn, hyperglycemia, hld, low thyroid , elevated psa, ckd3a, low b12       HPI:  Jonathon [name] is a 78 y.o. male here for wellness exam; due for colonoscopy, o/w up to date  Denies urinary symptoms such as dysuria, frequency, urgency, flank pain, hematuria or n/v, fever, chills.   Pt denies polydipsia, polyuria, or new focal neuro s/s.    Pt denies fever, wt loss, night sweats, loss of appetite, or other constitutional symptoms            Wt Readings from Last 3 Encounters:  07/31/23 187 lb (84.8 kg)  01/27/23 185 lb (83.9 kg)  01/26/23 183 lb (83 kg)   BP Readings from Last 3 Encounters:  07/31/23 138/80  01/27/23 118/72  07/29/22 128/72   Immunization History  Administered Date(s) Administered   Pneumococcal Conjugate-13 06/13/2013   Pneumococcal Polysaccharide-23 03/22/2011   Td 06/26/2014   Zoster Recombinant(Shingrix) 08/03/2017, 12/17/2017   Health Maintenance Due  Topic Date Due   Colonoscopy  08/09/2020      Past Medical History:  Diagnosis Date   Atrial fibrillation (HCC)    post op after CABG; tx with amiodarone    Coronary artery disease    a. s/p NSTEMI 12/12 => CABG;  b. inferior STEMI 5/14: 3/3 grafts patent, native CFX 100% => Promus DES    HLD (hyperlipidemia)    HTN (hypertension)    Lumbar disc disease 03/19/2011   Psoriasis 03/22/2011   S/P CABG (coronary artery bypass graft)    12/12 with Dr. Sherene Dilling: LIMA-LAD, SVG-RI, SVG-OM   Sciatica 03/19/2011   Thyroid  disease    Past Surgical History:  Procedure Laterality Date   CORONARY ARTERY BYPASS GRAFT  01/19/2011   Procedure: CORONARY ARTERY BYPASS GRAFTING (CABG);  Surgeon: Bartley Lightning, MD;  Location: [facility] OR;  Service: Open Heart Surgery;  Laterality: N/A;   LEFT HEART CATH N/A 07/07/2012   Procedure: LEFT HEART CATH;  Surgeon: Peter M Swaziland, MD;  Location: [facility] CATH LAB;   Service: Cardiovascular;  Laterality: N/A;   LEFT HEART CATHETERIZATION WITH CORONARY ANGIOGRAM N/A 01/13/2011   Procedure: LEFT HEART CATHETERIZATION WITH CORONARY ANGIOGRAM;  Surgeon: Peter M Swaziland, MD;  Location: [facility] CATH LAB;  Service: Cardiovascular;  Laterality: N/A;   PERCUTANEOUS CORONARY STENT INTERVENTION (PCI-S)  07/07/2012   Procedure: PERCUTANEOUS CORONARY STENT INTERVENTION (PCI-S);  Surgeon: Peter M Swaziland, MD;  Location: [facility] CATH LAB;  Service: Cardiovascular;;   TONSILLECTOMY      reports that he quit smoking about 55 years ago. His smoking use included cigarettes. He has never used smokeless tobacco. He reports that he does not drink alcohol and does not use drugs. family history includes Cancer in his mother; Colon cancer in his mother; Heart disease in his father and mother; Hyperlipidemia in his father. Allergies  Allergen Reactions   Dilaudid [Hydromorphone Hcl] Hypertension    Back and forth with hyper and hypotension   Current Outpatient Medications on File Prior to Visit  Medication Sig Dispense Refill   aspirin  EC 81 MG tablet Take 81 mg by mouth at bedtime.     nitroGLYCERIN  (NITROSTAT ) 0.4 MG SL tablet Place 1 tablet (0.4 mg total) under the tongue every 5 (five) minutes as needed for chest pain. 25 tablet 3   No current facility-administered medications on file prior  to visit.        ROS:  All others reviewed and negative.  Objective        PE:  BP 138/80 (BP Location: Left Arm, Patient Position: Sitting, Cuff Size: Normal)   Pulse 76   Temp 98.2 F (36.8 C) (Oral)   Ht 5' 10 (1.778 m)   Wt 187 lb (84.8 kg)   SpO2 98%   BMI 26.83 kg/m                 Constitutional: Pt appears in NAD               HENT: Head: NCAT.                Right Ear: External ear normal.                 Left Ear: External ear normal.                Eyes: . Pupils are equal, round, and reactive to light. Conjunctivae and EOM are normal               Nose: without d/c or  deformity               Neck: Neck supple. Gross normal ROM               Cardiovascular: Normal rate and regular rhythm.                 Pulmonary/Chest: Effort normal and breath sounds without rales or wheezing.                Abd:  Soft, NT, ND, + BS, no organomegaly               Neurological: Pt is alert. At baseline orientation, motor grossly intact               Skin: Skin is warm. No rashes, no other new lesions, LE edema - none               Psychiatric: Pt behavior is normal without agitation   Micro: none  Cardiac tracings I have personally interpreted today:  none  Pertinent Radiological findings (summarize): none   Lab Results  Component Value Date   WBC 5.7 07/27/2023   HGB 14.1 07/27/2023   HCT 41.3 07/27/2023   PLT 165.0 07/27/2023   GLUCOSE 113 (H) 07/27/2023   CHOL 105 07/27/2023   TRIG 59.0 07/27/2023   HDL 32.40 (L) 07/27/2023   LDLCALC 61 07/27/2023   ALT 15 07/27/2023   AST 27 07/27/2023   NA 138 07/27/2023   K 4.2 07/27/2023   CL 104 07/27/2023   CREATININE 1.18 07/27/2023   BUN 19 07/27/2023   CO2 28 07/27/2023   TSH 4.10 07/27/2023   PSA 3.80 07/27/2023   INR 1.95 (H) 07/07/2012   HGBA1C 6.0 07/27/2023   MICROALBUR <0.7 07/27/2023   Assessment/Plan:  Jonathon [name] is a 78 y.o. White or Caucasian [1] male with  has a past medical history of Atrial fibrillation (HCC), Coronary artery disease, HLD (hyperlipidemia), HTN (hypertension), Lumbar disc disease (03/19/2011), Psoriasis (03/22/2011), S/P CABG (coronary artery bypass graft), Sciatica (03/19/2011), and Thyroid  disease.  Encounter for well adult exam with abnormal findings Age and sex appropriate education and counseling updated with regular exercise and diet Referrals for preventative services - for colonoscopy Immunizations addressed - none needed Smoking counseling  - none needed Evidence for  depression or other mood disorder - none significant Most recent labs reviewed. I have  personally reviewed and have noted: 1) the patient's medical and social history 2) The patient's current medications and supplements 3) The patient's height, weight, and BMI have been recorded in the chart   CKD (chronic kidney disease) stage 3, GFR 30-59 ml/min (HCC) Lab Results  Component Value Date   CREATININE 1.18 07/27/2023   Stable overall, cont to avoid nephrotoxins  Hyperglycemia Lab Results  Component Value Date   HGBA1C 6.0 07/27/2023   Stable, pt to continue current medical treatment  - diet, wt control   Hyperlipidemia Lab Results  Component Value Date   LDLCALC 61 07/27/2023   Stable, pt to continue current statin crestor  40 qd   Hypertension BP Readings from Last 3 Encounters:  07/31/23 138/80  01/27/23 118/72  07/29/22 128/72   Stable, pt to continue medical treatment lopressor  25 bid   B12 deficiency Lab Results  Component Value Date   VITAMINB12 242 07/27/2023   Low, to start oral replacement - b12 1000 mcg qd   Increased prostate specific antigen (PSA) velocity Asympt, pt declines urology for now, for psa repeat in 6 months  Lab Results  Component Value Date   PSA 3.80 07/27/2023   PSA 2.90 01/20/2023   PSA 3.04 07/15/2022     Followup: Return in about 6 months (around 01/30/2024).  Rosalia Colonel, MD 08/01/2023 8:43 PM [facility] [place] [facility]

## 2023-07-31 NOTE — Patient Instructions (Signed)
 Ok to start OTC B12 1000 mcg per day for 6 months  Please continue all other medications as before, and refills have been done if requested.  Please have the pharmacy call with any other refills you may need.  Please continue your efforts at being more active, low cholesterol diet, and weight control.  You are otherwise up to date with prevention measures today.  Please keep your appointments with your specialists as you may have planned  You will be contacted regarding the referral for: colonoscopy  Please make an Appointment to return in 6 months, or sooner if needed, also with Lab Appointment for testing done 3-5 days before at the FIRST FLOOR Lab (so this is for TWO appointments - please see the scheduling desk as you leave)

## 2023-08-01 ENCOUNTER — Encounter: Payer: Self-pay | Admitting: Internal Medicine

## 2023-08-01 DIAGNOSIS — E538 Deficiency of other specified B group vitamins: Secondary | ICD-10-CM | POA: Insufficient documentation

## 2023-08-01 NOTE — Assessment & Plan Note (Signed)
 Lab Results  Component Value Date   HGBA1C 6.0 07/27/2023   Stable, pt to continue current medical treatment  - diet, wt control

## 2023-08-01 NOTE — Assessment & Plan Note (Signed)
 Age and sex appropriate education and counseling updated with regular exercise and diet Referrals for preventative services - for colonoscopy Immunizations addressed - none needed Smoking counseling  - none needed Evidence for depression or other mood disorder - none significant Most recent labs reviewed. I have personally reviewed and have noted: 1) the patient's medical and social history 2) The patient's current medications and supplements 3) The patient's height, weight, and BMI have been recorded in the chart

## 2023-08-01 NOTE — Assessment & Plan Note (Signed)
 Lab Results  Component Value Date   CREATININE 1.18 07/27/2023   Stable overall, cont to avoid nephrotoxins

## 2023-08-01 NOTE — Assessment & Plan Note (Signed)
 Lab Results  Component Value Date   LDLCALC 61 07/27/2023   Stable, pt to continue current statin crestor  40 qd

## 2023-08-01 NOTE — Assessment & Plan Note (Addendum)
 Asympt, pt declines urology for now, for psa repeat in 6 months  Lab Results  Component Value Date   PSA 3.80 07/27/2023   PSA 2.90 01/20/2023   PSA 3.04 07/15/2022

## 2023-08-01 NOTE — Assessment & Plan Note (Signed)
 BP Readings from Last 3 Encounters:  07/31/23 138/80  01/27/23 118/72  07/29/22 128/72   Stable, pt to continue medical treatment lopressor  25 bid

## 2023-08-01 NOTE — Assessment & Plan Note (Signed)
 Lab Results  Component Value Date   VITAMINB12 242 07/27/2023   Low, to start oral replacement - b12 1000 mcg qd

## 2023-09-04 ENCOUNTER — Telehealth: Payer: Self-pay | Admitting: Gastroenterology

## 2023-09-04 NOTE — Telephone Encounter (Signed)
 Good afternoon Dr. Legrand,   Patient has a referral for colonoscopy. Patient was due in 2022 and is now the age of 26. Would you like for patient to be scheduled directly or seen in office first? Please advise on scheduling.   Thank you

## 2023-09-04 NOTE — Telephone Encounter (Signed)
 Thank you for the note.  Based on his 07/31/2023 primary care clinic note, he is currently in reasonably good and stable health.  Therefore, he can be directly booked for a colonoscopy with me in the [facility].  H Danis

## 2023-09-06 ENCOUNTER — Encounter: Payer: Self-pay | Admitting: Gastroenterology

## 2023-09-18 NOTE — Progress Notes (Unsigned)
  Cardiology Office Note:   Date:  09/21/2023  ID:  Jonathon [name], DOB 1945/09/10, MRN [id number] PCP: Norleen Lynwood ORN, MD  [facility] Providers Cardiologist:  Lynwood Schilling, MD {  History of Present Illness:   Jonathon [name] is a 78 y.o. male who presents for followup after bypass surgery and subsequent circ stenting.  He had an occluded native CFX treated with a Promus premier DES in May of 2014.  He had a negative stress test in 2018.     Since I last saw him he has done well.  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 The patient denies any new symptoms such as chest discomfort, neck or arm discomfort. There has been no new shortness of breath, PND or orthopnea. There have been no reported palpitations, presyncope or syncope.   ROS: As stated in the HPI and negative for all other systems.  Studies Reviewed:    EKG:   EKG Interpretation Date/Time:  Thursday September 21 2023 08:24:00 EDT Ventricular Rate:  53 PR Interval:  152 QRS Duration:  92 QT Interval:  424 QTC Calculation: 397 R Axis:   -24  Text Interpretation: Sinus bradycardia When compared with ECG of 09-Jul-2012 04:45, No significant change was found Confirmed by Schilling Lynwood ([id number]) on 09/21/2023 8:33:54 AM     Risk Assessment/Calculations:           Physical Exam:   VS:  BP (!) 152/90 (BP Location: Right Arm, Patient Position: Sitting, Cuff Size: Normal)   Pulse (!) 53   Ht 5' 10 (1.778 m)   Wt 187 lb 1.6 oz (84.9 kg)   SpO2 99%   BMI 26.85 kg/m    Wt Readings from Last 3 Encounters:  09/21/23 187 lb 1.6 oz (84.9 kg)  07/31/23 187 lb (84.8 kg)  01/27/23 185 lb (83.9 kg)     GEN: Well nourished, well developed in no acute distress NECK: No JVD; No carotid bruits CARDIAC: RRR, no murmurs, rubs, gallops RESPIRATORY:  Clear to auscultation without rales, wheezing or rhonchi  ABDOMEN: Soft, non-tender, non-distended EXTREMITIES:  No edema; No  deformity   ASSESSMENT AND PLAN:   CAD:  The patient has no new sypmtoms.  No further cardiovascular testing is indicated.  We will continue with aggressive risk reduction and meds as listed.   HTN: His blood pressure is at target.  No change in therapy.   DYSLIPIDEMIA: His LDL is 61.  No change in therapy.  I will add an LPa to his next lab draw.      Follow up with me in 18 months.   Signed, Lynwood Schilling, MD

## 2023-09-21 ENCOUNTER — Encounter: Payer: Self-pay | Admitting: Cardiology

## 2023-09-21 ENCOUNTER — Ambulatory Visit: Attending: Cardiology | Admitting: Cardiology

## 2023-09-21 VITALS — BP 152/90 | HR 53 | Ht 70.0 in | Wt 187.1 lb

## 2023-09-21 DIAGNOSIS — I2510 Atherosclerotic heart disease of native coronary artery without angina pectoris: Secondary | ICD-10-CM

## 2023-09-21 DIAGNOSIS — I10 Essential (primary) hypertension: Secondary | ICD-10-CM | POA: Diagnosis not present

## 2023-09-21 DIAGNOSIS — E785 Hyperlipidemia, unspecified: Secondary | ICD-10-CM

## 2023-09-21 NOTE — Patient Instructions (Addendum)
 Medication Instructions:  Your physician recommends that you continue on your current medications as directed. Please refer to the Current Medication list given to you today.  *If you need a refill on your cardiac medications before your next appointment, please call your pharmacy*  Lab Work: Lp(a) today If you have labs (blood work) drawn today and your tests are completely normal, you will receive your results only by: MyChart Message (if you have MyChart) OR A paper copy in the mail If you have any lab test that is abnormal or we need to change your treatment, we will call you to review the results.  Testing/Procedures: NONE  Follow-Up: At [facility], you and your health needs are our priority.  As part of our continuing mission to provide you with exceptional heart care, our providers are all part of one team.  This team includes your primary Cardiologist (physician) and Advanced Practice Providers or APPs (Physician Assistants and Nurse Practitioners) who all work together to provide you with the care you need, when you need it.  Your next appointment:   18 months  Provider:   [place], MD  We recommend signing up for the patient portal called MyChart.  Sign up information is provided on this After Visit Summary.  MyChart is used to connect with patients for Virtual Visits (Telemedicine).  Patients are able to view lab/test results, encounter notes, upcoming appointments, etc.  Non-urgent messages can be sent to your provider as well.   To learn more about what you can do with MyChart, go to [email or web address].

## 2023-09-22 DIAGNOSIS — E785 Hyperlipidemia, unspecified: Secondary | ICD-10-CM | POA: Diagnosis not present

## 2023-09-25 ENCOUNTER — Ambulatory Visit: Payer: Self-pay | Admitting: Cardiology

## 2023-09-25 LAB — LIPOPROTEIN A (LPA): Lipoprotein (a): 18.3 nmol/L (ref <75.0)

## 2023-09-27 ENCOUNTER — Encounter: Payer: Self-pay | Admitting: Gastroenterology

## 2023-09-27 ENCOUNTER — Ambulatory Visit: Admitting: *Deleted

## 2023-09-27 VITALS — Ht 70.0 in | Wt 187.0 lb

## 2023-09-27 DIAGNOSIS — Z860100 Personal history of colon polyps, unspecified: Secondary | ICD-10-CM

## 2023-09-27 NOTE — Progress Notes (Signed)
 Pre visit completed over telephone Instructions send through confirmed active MyChart   No egg or soy allergy known to patient  No issues known to pt with past sedation with any surgeries or procedures Patient denies ever being told they had issues or difficulty with intubation  No FH of Malignant Hyperthermia Pt is not on diet pills Pt is not on  home 02  Pt is not on blood thinners  Pt denies issues with constipation  No A fib or A flutter Have any cardiac testing pending-- NO   Patient had excellent results with Miralax, repeating same prep.  Chart reviewed by DOROTHA Schillings, CRNA

## 2023-10-11 ENCOUNTER — Ambulatory Visit: Admitting: Gastroenterology

## 2023-10-11 ENCOUNTER — Encounter: Payer: Self-pay | Admitting: Gastroenterology

## 2023-10-11 VITALS — BP 120/70 | HR 58 | Temp 97.3°F | Resp 15 | Ht 70.0 in | Wt 187.0 lb

## 2023-10-11 DIAGNOSIS — D122 Benign neoplasm of ascending colon: Secondary | ICD-10-CM

## 2023-10-11 DIAGNOSIS — I10 Essential (primary) hypertension: Secondary | ICD-10-CM | POA: Diagnosis not present

## 2023-10-11 DIAGNOSIS — D120 Benign neoplasm of cecum: Secondary | ICD-10-CM

## 2023-10-11 DIAGNOSIS — Z800 Family history of malignant neoplasm of digestive organs: Secondary | ICD-10-CM

## 2023-10-11 DIAGNOSIS — Z860101 Personal history of adenomatous and serrated colon polyps: Secondary | ICD-10-CM | POA: Diagnosis not present

## 2023-10-11 DIAGNOSIS — I4891 Unspecified atrial fibrillation: Secondary | ICD-10-CM | POA: Diagnosis not present

## 2023-10-11 DIAGNOSIS — Z1211 Encounter for screening for malignant neoplasm of colon: Secondary | ICD-10-CM | POA: Diagnosis not present

## 2023-10-11 DIAGNOSIS — E785 Hyperlipidemia, unspecified: Secondary | ICD-10-CM | POA: Diagnosis not present

## 2023-10-11 DIAGNOSIS — K648 Other hemorrhoids: Secondary | ICD-10-CM

## 2023-10-11 DIAGNOSIS — Z860100 Personal history of colon polyps, unspecified: Secondary | ICD-10-CM

## 2023-10-11 MED ORDER — SODIUM CHLORIDE 0.9 % IV SOLN: 500.0000 mL | Freq: Once | INTRAVENOUS | Status: DC

## 2023-10-11 NOTE — Progress Notes (Signed)
 Sedate, gd SR, tolerated procedure well, VSS, report to RN

## 2023-10-11 NOTE — Op Note (Signed)
 [facility] Patient Name: Jonathon [name] Procedure Date: 10/11/2023 10:11 AM MRN: [id number] Endoscopist: Victory L. Legrand , MD, [id number] Age: 78 Referring MD:  Date of Birth: 1945/04/01 Gender: Male Account #: [id number] Procedure:                Colonoscopy Indications:              Colon cancer screening in patient at increased                            risk: Colorectal cancer in mother, Surveillance:                            Personal history of adenomatous polyps on last                            colonoscopy > 5 years ago Medicines:                Monitored Anesthesia Care Procedure:                Pre-Anesthesia Assessment:                           - Prior to the procedure, a History and Physical                            was performed, and patient medications and                            allergies were reviewed. The patient's tolerance of                            previous anesthesia was also reviewed. The risks                            and benefits of the procedure and the sedation                            options and risks were discussed with the patient.                            All questions were answered, and informed consent                            was obtained. Prior Anticoagulants: The patient has                            taken no anticoagulant or antiplatelet agents. ASA                            Grade Assessment: III - A patient with severe                            systemic disease. After reviewing the risks and  benefits, the patient was deemed in satisfactory                            condition to undergo the procedure.                           After obtaining informed consent, the colonoscope                            was passed under direct vision. Throughout the                            procedure, the patient's blood pressure, pulse, and                            oxygen saturations were monitored  continuously. The                            Olympus CF-HQ190L ([id number]) Colonoscope was                            introduced through the anus and advanced to the the                            cecum, identified by appendiceal orifice and                            ileocecal valve. The colonoscopy was performed                            without difficulty. The patient tolerated the                            procedure well. The quality of the bowel                            preparation was good after lavage. The ileocecal                            valve, appendiceal orifice, and rectum were                            photographed. Scope In: 10:27:05 AM Scope Out: 10:45:31 AM Scope Withdrawal Time: 0 hours 13 minutes 23 seconds  Total Procedure Duration: 0 hours 18 minutes 26 seconds  Findings:                 The perianal and digital rectal examinations were                            normal.                           Repeat examination of right colon under NBI  performed.                           Three sessile polyps were found in the ascending                            colon and cecum. The polyps were diminutive in                            size. These polyps were removed with a cold snare.                            Resection and retrieval were complete.                           Internal hemorrhoids were found.                           The exam was otherwise without abnormality on                            direct and retroflexion views. Complications:            No immediate complications. Estimated Blood Loss:     Estimated blood loss was minimal. Impression:               - Three diminutive polyps in the ascending colon                            and in the cecum, removed with a cold snare.                            Resected and retrieved.                           - Internal hemorrhoids.                           - The examination was  otherwise normal on direct                            and retroflexion views. Recommendation:           - Patient has a contact number available for                            emergencies. The signs and symptoms of potential                            delayed complications were discussed with the                            patient. Return to normal activities tomorrow.                            Written discharge instructions were provided to the  patient.                           - Resume previous diet.                           - Continue present medications.                           - Await pathology results.                           - No repeat colonoscopy due to age, current                            guidelines and today's findings. Adriane Guglielmo L. Legrand, MD 10/11/2023 10:49:15 AM This report has been signed electronically.

## 2023-10-11 NOTE — Progress Notes (Signed)
 Called to room to assist during endoscopic procedure.  Patient ID and intended procedure confirmed with present staff. Received instructions for my participation in the procedure from the performing physician.

## 2023-10-11 NOTE — Progress Notes (Signed)
 History and Physical:  This patient presents for endoscopic testing for: Encounter Diagnoses  Name Primary?   Hx of colonic polyps Yes   Family history of colon cancer     21 old man here today for a surveillance colonoscopy.  He had 2 subcentimeter tubular adenomas in June 2017.  His mother also had colorectal cancer.  Patient denies chronic abdominal pain, rectal bleeding, constipation or diarrhea.  Patient is otherwise without complaints or active issues today.   Past Medical History: Past Medical History:  Diagnosis Date   Atrial fibrillation (HCC)    post op after CABG; tx with amiodarone    Coronary artery disease    a. s/p NSTEMI 12/12 => CABG;  b. inferior STEMI 5/14: 3/3 grafts patent, native CFX 100% => Promus DES    HLD (hyperlipidemia)    HTN (hypertension)    Lumbar disc disease 03/19/2011   Psoriasis 03/22/2011   S/P CABG (coronary artery bypass graft)    12/12 with Dr. Lucas: LIMA-LAD, SVG-RI, SVG-OM   Sciatica 03/19/2011   Thyroid  disease      Past Surgical History: Past Surgical History:  Procedure Laterality Date   COLONOSCOPY  2017   CORONARY ARTERY BYPASS GRAFT  01/19/2011   Procedure: CORONARY ARTERY BYPASS GRAFTING (CABG);  Surgeon: Dorise MARLA Lucas, MD;  Location: [facility] OR;  Service: Open Heart Surgery;  Laterality: N/A;   LEFT HEART CATH N/A 07/07/2012   Procedure: LEFT HEART CATH;  Surgeon: Peter M Swaziland, MD;  Location: [facility] CATH LAB;  Service: Cardiovascular;  Laterality: N/A;   LEFT HEART CATHETERIZATION WITH CORONARY ANGIOGRAM N/A 01/13/2011   Procedure: LEFT HEART CATHETERIZATION WITH CORONARY ANGIOGRAM;  Surgeon: Peter M Swaziland, MD;  Location: [facility] CATH LAB;  Service: Cardiovascular;  Laterality: N/A;   PERCUTANEOUS CORONARY STENT INTERVENTION (PCI-S)  07/07/2012   Procedure: PERCUTANEOUS CORONARY STENT INTERVENTION (PCI-S);  Surgeon: Peter M Swaziland, MD;  Location: [facility] CATH LAB;  Service: Cardiovascular;;   TONSILLECTOMY      Allergies: Allergies   Allergen Reactions   Dilaudid [Hydromorphone Hcl] Hypertension    Back and forth with hyper and hypotension    Outpatient Meds: Current Outpatient Medications  Medication Sig Dispense Refill   aspirin  EC 81 MG tablet Take 81 mg by mouth at bedtime.     Cyanocobalamin  (B-12 PO) Take 1,000 mcg by mouth daily.     levothyroxine  (SYNTHROID ) 75 MCG tablet Take 1 tablet (75 mcg total) by mouth daily. 90 tablet 3   metoprolol  tartrate (LOPRESSOR ) 25 MG tablet 1 tab by mouth twice per day 180 tablet 3   rosuvastatin  (CRESTOR ) 40 MG tablet Take 1 tablet (40 mg total) by mouth daily. 90 tablet 3   nitroGLYCERIN  (NITROSTAT ) 0.4 MG SL tablet Place 1 tablet (0.4 mg total) under the tongue every 5 (five) minutes as needed for chest pain. 25 tablet 3   Current Facility-Administered Medications  Medication Dose Route Frequency Provider Last Rate Last Admin   0.9 %  sodium chloride  infusion  500 mL Intravenous Once Danis, Victory CROME III, MD          ___________________________________________________________________ Objective   Exam:  BP (!) 148/84   Pulse 72   Temp (!) 97.3 F (36.3 C) (Temporal)   Ht 5' 10 (1.778 m)   Wt 187 lb (84.8 kg)   SpO2 97%   BMI 26.83 kg/m   CV: regular , S1/S2 Resp: clear to auscultation bilaterally, normal RR and effort noted GI: soft, no tenderness, with active bowel  sounds.   Assessment: Encounter Diagnoses  Name Primary?   Hx of colonic polyps Yes   Family history of colon cancer      Plan: Colonoscopy   The benefits and risks of the planned procedure(s) were described in detail with the patient or (when appropriate) their health care proxy.  Risks were outlined as including, but not limited to, bleeding, infection, perforation, adverse medication reaction leading to cardiac or pulmonary decompensation, pancreatitis (if ERCP).  The limitation of incomplete mucosal visualization was also discussed.  No guarantees or warranties were given.  The  patient is appropriate for an endoscopic procedure in the ambulatory setting.   - Victory Brand, MD

## 2023-10-11 NOTE — Progress Notes (Signed)
 Pt's states no medical or surgical changes since previsit or office visit.

## 2023-10-11 NOTE — Patient Instructions (Signed)
 Educational handout provided to patient related to Hemorrhoids & Polyps  Resume previous diet  Continue present medications  Awaiting pathology results  NO REPEAT COLONOSCOPY DUE TO AGE   YOU HAD AN ENDOSCOPIC PROCEDURE TODAY AT THE [facility]:   Refer to the procedure report that was given to you for any specific questions about what was found during the examination.  If the procedure report does not answer your questions, please call your gastroenterologist to clarify.  If you requested that your care partner not be given the details of your procedure findings, then the procedure report has been included in a sealed envelope for you to review at your convenience later.  YOU SHOULD EXPECT: Some feelings of bloating in the abdomen. Passage of more gas than usual.  Walking can help get rid of the air that was put into your GI tract during the procedure and reduce the bloating. If you had a lower endoscopy (such as a colonoscopy or flexible sigmoidoscopy) you may notice spotting of blood in your stool or on the toilet paper. If you underwent a bowel prep for your procedure, you may not have a normal bowel movement for a few days.  Please Note:  You might notice some irritation and congestion in your nose or some drainage.  This is from the oxygen used during your procedure.  There is no need for concern and it should clear up in a day or so.  SYMPTOMS TO REPORT IMMEDIATELY:  Following lower endoscopy (colonoscopy or flexible sigmoidoscopy):  Excessive amounts of blood in the stool  Significant tenderness or worsening of abdominal pains  Swelling of the abdomen that is new, acute  Fever of 100F or higher  For urgent or emergent issues, a gastroenterologist can be reached at any hour by calling [phone]. Do not use MyChart messaging for urgent concerns.    DIET:  We do recommend a small meal at first, but then you may proceed to your regular diet.  Drink plenty of fluids  but you should avoid alcoholic beverages for 24 hours.  ACTIVITY:  You should plan to take it easy for the rest of today and you should NOT DRIVE or use heavy machinery until tomorrow (because of the sedation medicines used during the test).    FOLLOW UP: Our staff will call the number listed on your records the next business day following your procedure.  We will call around 7:15- 8:00 am to check on you and address any questions or concerns that you may have regarding the information given to you following your procedure. If we do not reach you, we will leave a message.     If any biopsies were taken you will be contacted by phone or by letter within the next 1-3 weeks.  Please call us  at [phone] if you have not heard about the biopsies in 3 weeks.    SIGNATURES/CONFIDENTIALITY: You and/or your care partner have signed paperwork which will be entered into your electronic medical record.  These signatures attest to the fact that that the information above on your After Visit Summary has been reviewed and is understood.  Full responsibility of the confidentiality of this discharge information lies with you and/or your care-partner.

## 2023-10-12 ENCOUNTER — Telehealth: Payer: Self-pay | Admitting: *Deleted

## 2023-10-12 NOTE — Telephone Encounter (Signed)
  Follow up Call-     10/11/2023    8:51 AM  Call back number  Post procedure Call Back phone  # [phone]  Permission to leave phone message Yes     Patient questions:  Do you have a fever, pain , or abdominal swelling? No. Pain Score  0 *  Have you tolerated food without any problems? Yes.    Have you been able to return to your normal activities? Yes.    Do you have any questions about your discharge instructions: Diet   No. Medications  No. Follow up visit  No.  Do you have questions or concerns about your Care? No.  Actions: * If pain score is 4 or above: No action needed, pain <4.

## 2023-10-13 LAB — SURGICAL PATHOLOGY

## 2023-10-22 ENCOUNTER — Ambulatory Visit: Payer: Self-pay | Admitting: Gastroenterology

## 2024-01-25 ENCOUNTER — Other Ambulatory Visit

## 2024-01-25 DIAGNOSIS — E559 Vitamin D deficiency, unspecified: Secondary | ICD-10-CM | POA: Diagnosis not present

## 2024-01-25 DIAGNOSIS — E7800 Pure hypercholesterolemia, unspecified: Secondary | ICD-10-CM | POA: Diagnosis not present

## 2024-01-25 DIAGNOSIS — R972 Elevated prostate specific antigen [PSA]: Secondary | ICD-10-CM | POA: Diagnosis not present

## 2024-01-25 DIAGNOSIS — E538 Deficiency of other specified B group vitamins: Secondary | ICD-10-CM

## 2024-01-25 DIAGNOSIS — R739 Hyperglycemia, unspecified: Secondary | ICD-10-CM | POA: Diagnosis not present

## 2024-01-25 LAB — HEPATIC FUNCTION PANEL
ALT: 18 U/L (ref 0–53)
AST: 23 U/L (ref 0–37)
Albumin: 4 g/dL (ref 3.5–5.2)
Alkaline Phosphatase: 47 U/L (ref 39–117)
Bilirubin, Direct: 0.1 mg/dL (ref 0.0–0.3)
Total Bilirubin: 0.7 mg/dL (ref 0.2–1.2)
Total Protein: 6.2 g/dL (ref 6.0–8.3)

## 2024-01-25 LAB — PSA: PSA: 2.65 ng/mL (ref 0.10–4.00)

## 2024-01-25 LAB — BASIC METABOLIC PANEL WITH GFR
BUN: 14 mg/dL (ref 6–23)
CO2: 31 meq/L (ref 19–32)
Calcium: 8.9 mg/dL (ref 8.4–10.5)
Chloride: 105 meq/L (ref 96–112)
Creatinine, Ser: 1.21 mg/dL (ref 0.40–1.50)
GFR: 57.43 mL/min — ABNORMAL LOW (ref >60.00)
Glucose, Bld: 111 mg/dL — ABNORMAL HIGH (ref 70–99)
Potassium: 4.1 meq/L (ref 3.5–5.1)
Sodium: 141 meq/L (ref 135–145)

## 2024-01-25 LAB — VITAMIN B12: Vitamin B-12: 661 pg/mL (ref 211–911)

## 2024-01-25 LAB — LIPID PANEL
Cholesterol: 107 mg/dL (ref 0–200)
HDL: 38.9 mg/dL — ABNORMAL LOW (ref >39.00)
LDL Cholesterol: 52 mg/dL (ref 0–99)
NonHDL: 68.15
Total CHOL/HDL Ratio: 3
Triglycerides: 81 mg/dL (ref 0.0–149.0)
VLDL: 16.2 mg/dL (ref 0.0–40.0)

## 2024-01-25 LAB — VITAMIN D 25 HYDROXY (VIT D DEFICIENCY, FRACTURES): VITD: 43.48 ng/mL (ref 30.00–100.00)

## 2024-01-25 LAB — HEMOGLOBIN A1C: Hgb A1c MFr Bld: 5.8 % (ref 4.6–6.5)

## 2024-01-29 ENCOUNTER — Ambulatory Visit

## 2024-01-29 VITALS — Ht 70.0 in | Wt 187.0 lb

## 2024-01-29 DIAGNOSIS — Z0000 Encounter for general adult medical examination without abnormal findings: Secondary | ICD-10-CM | POA: Diagnosis not present

## 2024-01-29 NOTE — Progress Notes (Signed)
 Chief Complaint  Patient presents with   Medicare Wellness     Subjective:   Jonathon [name] is a 78 y.o. male who presents for a Medicare Annual Wellness Visit.  Visit info / Clinical Intake: Medicare Wellness Visit Type:: Subsequent Annual Wellness Visit Persons participating in visit and providing information:: patient Medicare Wellness Visit Mode:: Telephone If telephone:: video declined Since this visit was completed virtually, some vitals may be partially provided or unavailable. Missing vitals are due to the limitations of the virtual format.: Documented vitals are patient reported If Telephone or Video please confirm:: I connected with patient using audio/video enable telemedicine. I verified patient identity with two identifiers, discussed telehealth limitations, and patient agreed to proceed. Patient Location:: Home Provider Location:: Office Interpreter Needed?: No Pre-visit prep was completed: yes AWV questionnaire completed by patient prior to visit?: yes Date:: 01/25/24 Living arrangements:: lives with spouse/significant other Patient's Overall Health Status Rating: very good Typical amount of pain: none Does pain affect daily life?: no Are you currently prescribed opioids?: no  Dietary Habits and Nutritional Risks How many meals a day?: 3 Eats fruit and vegetables daily?: yes Most meals are obtained by: preparing own meals; eating out In the last 2 weeks, have you had any of the following?: none Diabetic:: no  Functional Status Activities of Daily Living (to include ambulation/medication): Independent Ambulation: Independent with device- listed below Home Assistive Devices/Equipment: Eyeglasses Medication Administration: Independent Home Management (perform basic housework or laundry): Independent Manage your own finances?: yes Primary transportation is: driving Concerns about vision?: no *vision screening is required for WTM* Concerns about hearing?:  no  Fall Screening Falls in the past year?: 0 Number of falls in past year: 0 Was there an injury with Fall?: 0 Fall Risk Category Calculator: 0 Patient Fall Risk Level: Low Fall Risk  Fall Risk Patient at Risk for Falls Due to: No Fall Risks Fall risk Follow up: Falls evaluation completed; Falls prevention discussed  Home and Transportation Safety: All rugs have non-skid backing?: N/A, no rugs All stairs or steps have railings?: N/A, no stairs Grab bars in the bathtub or shower?: yes Have non-skid surface in bathtub or shower?: yes Good home lighting?: yes Regular seat belt use?: yes Hospital stays in the last year:: no  Cognitive Assessment Difficulty concentrating, remembering, or making decisions? : no Will 6CIT or Mini Cog be Completed: yes What year is it?: 0 points What month is it?: 0 points Give patient an address phrase to remember (5 components): [address] [place], [place] About what time is it?: 0 points Count backwards from 20 to 1: 0 points Say the months of the year in reverse: 0 points Repeat the address phrase from earlier: 2 points (Drive) 6 CIT Score: 2 points  Advance Directives (For Healthcare) Does Patient Have a Medical Advance Directive?: No Would patient like information on creating a medical advance directive?: No - Patient declined  Reviewed/Updated  Reviewed/Updated: Reviewed All (Medical, Surgical, Family, Medications, Allergies, Care Teams, Patient Goals)    Allergies (verified) Dilaudid [hydromorphone hcl]   Current Medications (verified) Outpatient Encounter Medications as of 01/29/2024  Medication Sig   aspirin  EC 81 MG tablet Take 81 mg by mouth at bedtime.   Cyanocobalamin  (B-12 PO) Take 1,000 mcg by mouth daily.   levothyroxine  (SYNTHROID ) 75 MCG tablet Take 1 tablet (75 mcg total) by mouth daily.   metoprolol  tartrate (LOPRESSOR ) 25 MG tablet 1 tab by mouth twice per day   nitroGLYCERIN  (NITROSTAT ) 0.4 MG  SL tablet Place 1  tablet (0.4 mg total) under the tongue every 5 (five) minutes as needed for chest pain.   rosuvastatin  (CRESTOR ) 40 MG tablet Take 1 tablet (40 mg total) by mouth daily.   No facility-administered encounter medications on file as of 01/29/2024.    History: Past Medical History:  Diagnosis Date   Atrial fibrillation (HCC)    post op after CABG; tx with amiodarone    Coronary artery disease    a. s/p NSTEMI 12/12 => CABG;  b. inferior STEMI 5/14: 3/3 grafts patent, native CFX 100% => Promus DES    HLD (hyperlipidemia)    HTN (hypertension)    Lumbar disc disease 03/19/2011   Psoriasis 03/22/2011   S/P CABG (coronary artery bypass graft)    12/12 with Dr. Lucas: LIMA-LAD, SVG-RI, SVG-OM   Sciatica 03/19/2011   Thyroid  disease    Past Surgical History:  Procedure Laterality Date   COLONOSCOPY  2017   CORONARY ARTERY BYPASS GRAFT  01/19/2011   Procedure: CORONARY ARTERY BYPASS GRAFTING (CABG);  Surgeon: Dorise MARLA Lucas, MD;  Location: [facility] OR;  Service: Open Heart Surgery;  Laterality: N/A;   LEFT HEART CATH N/A 07/07/2012   Procedure: LEFT HEART CATH;  Surgeon: Peter M Jordan, MD;  Location: [facility] CATH LAB;  Service: Cardiovascular;  Laterality: N/A;   LEFT HEART CATHETERIZATION WITH CORONARY ANGIOGRAM N/A 01/13/2011   Procedure: LEFT HEART CATHETERIZATION WITH CORONARY ANGIOGRAM;  Surgeon: Peter M Jordan, MD;  Location: [facility] CATH LAB;  Service: Cardiovascular;  Laterality: N/A;   PERCUTANEOUS CORONARY STENT INTERVENTION (PCI-S)  07/07/2012   Procedure: PERCUTANEOUS CORONARY STENT INTERVENTION (PCI-S);  Surgeon: Peter M Jordan, MD;  Location: [facility] CATH LAB;  Service: Cardiovascular;;   TONSILLECTOMY     Family History  Problem Relation Age of Onset   Cancer Mother        colon   Heart disease Mother    Colon cancer Mother    Hyperlipidemia Father    Heart disease Father    Colon cancer Son    Esophageal cancer Neg Hx    Stomach cancer Neg Hx    Rectal cancer Neg Hx    Social History    Occupational History   Occupation: Retired  Tobacco Use   Smoking status: Former    Current packs/day: 0.00    Types: Cigarettes    Quit date: 05/12/1968    Years since quitting: 55.7   Smokeless tobacco: Never  Vaping Use   Vaping status: Never Used  Substance and Sexual Activity   Alcohol use: No    Alcohol/week: 0.0 standard drinks of alcohol    Comment: occasional   Drug use: No   Sexual activity: Yes   Tobacco Counseling Counseling given: Yes  SDOH Screenings   Food Insecurity: No Food Insecurity (01/29/2024)  Housing: Low Risk (01/29/2024)  Transportation Needs: No Transportation Needs (01/29/2024)  Utilities: Not At Risk (01/29/2024)  Alcohol Screen: Low Risk (01/26/2022)  Depression (PHQ2-9): Low Risk (01/29/2024)  Financial Resource Strain: Low Risk (01/25/2024)  Physical Activity: Insufficiently Active (01/29/2024)  Social Connections: Moderately Isolated (01/29/2024)  Stress: No Stress Concern Present (01/29/2024)  Tobacco Use: Medium Risk (01/29/2024)  Health Literacy: Adequate Health Literacy (01/29/2024)   See flowsheets for full screening details  Depression Screen PHQ 2 & 9 Depression Scale- Over the past 2 weeks, how often have you been bothered by any of the following problems? Little interest or pleasure in doing things: 0 Feeling down, depressed, or hopeless (PHQ Adolescent  also includes...irritable): 0 PHQ-2 Total Score: 0 Trouble falling or staying asleep, or sleeping too much: 0 Feeling tired or having little energy: 0 Poor appetite or overeating (PHQ Adolescent also includes...weight loss): 0 Feeling bad about yourself - or that you are a failure or have let yourself or your family down: 0 Trouble concentrating on things, such as reading the newspaper or watching television (PHQ Adolescent also includes...like school work): 0 Moving or speaking so slowly that other people could have noticed. Or the opposite - being so fidgety or restless that  you have been moving around a lot more than usual: 0 Thoughts that you would be better off dead, or of hurting yourself in some way: 0 PHQ-9 Total Score: 0 If you checked off any problems, how difficult have these problems made it for you to do your work, take care of things at home, or get along with other people?: Not difficult at all  Depression Treatment Depression Interventions/Treatment : EYV7-0 Score <4 Follow-up Not Indicated     Goals Addressed               This Visit's Progress     Patient Stated (pt-stated)        Patient stated he plans to continue staying active - keep walking             Objective:    Today's Vitals   01/29/24 1533  Weight: 187 lb (84.8 kg)  Height: 5' 10 (1.778 m)   Body mass index is 26.83 kg/m.  Hearing/Vision screen Hearing Screening - Comments:: Denies hearing difficulties   Vision Screening - Comments:: Wears eyeglasses for reading - up to date with routine eye exams with [facility] Immunizations and Health Maintenance Health Maintenance  Topic Date Due   Influenza Vaccine  05/14/2024 (Originally 09/15/2023)   DTaP/Tdap/Td (2 - Tdap) 06/25/2024   Medicare Annual Wellness (AWV)  01/28/2025   Pneumococcal Vaccine: 50+ Years  Completed   Hepatitis C Screening  Completed   Zoster Vaccines- Shingrix  Completed   Meningococcal B Vaccine  Aged Out   Colonoscopy  Discontinued   COVID-19 Vaccine  Discontinued        Assessment/Plan:  This is a routine wellness examination for Jonathon [name].  I have recommended that this patient have a Influenza vaccine but he declines at this time. I have discussed the risks and benefits of this procedure with him. The patient verbalizes understanding.   Patient Care Team: Norleen Lynwood ORN, MD as PCP - General (Internal Medicine) Lynwood Schilling, MD as PCP - Cardiology (Cardiology) Debarah [place] DEL., MD (Ophthalmology)  I have personally reviewed and noted the following in the patients  chart:   Medical and social history Use of alcohol, tobacco or illicit drugs  Current medications and supplements including opioid prescriptions. Functional ability and status Nutritional status Physical activity Advanced directives List of other physicians Hospitalizations, surgeries, and ER visits in previous 12 months Vitals Screenings to include cognitive, depression, and falls Referrals and appointments  No orders of the defined types were placed in this encounter.  In addition, I have reviewed and discussed with patient certain preventive protocols, quality metrics, and best practice recommendations. A written personalized care plan for preventive services as well as general preventive health recommendations were provided to patient.   Verdie CHRISTELLA Saba, CMA   01/29/2024   Return in 1 year (on 01/28/2025).  After Visit Summary: (MyChart) Due to this being a telephonic visit, the after  visit summary with patients personalized plan was offered to patient via MyChart   Nurse Notes: scheduled 2026 AWV appt

## 2024-01-29 NOTE — Patient Instructions (Addendum)
 Jonathon [name],  Thank you for taking the time for your Medicare Wellness Visit. I appreciate your continued commitment to your health goals. Please review the care plan we discussed, and feel free to reach out if I can assist you further.  Please note that Annual Wellness Visits do not include a physical exam. Some assessments may be limited, especially if the visit was conducted virtually. If needed, we may recommend an in-person follow-up with your provider.  Ongoing Care Seeing your primary care provider every 3 to 6 months helps us  monitor your health and provide consistent, personalized care.   Referrals If a referral was made during today's visit and you haven't received any updates within two weeks, please contact the referred provider directly to check on the status.  Recommended Screenings:  Health Maintenance  Topic Date Due   Flu Shot  05/14/2024*   DTaP/Tdap/Td vaccine (2 - Tdap) 06/25/2024   Medicare Annual Wellness Visit  01/28/2025   Pneumococcal Vaccine for age over 104  Completed   Hepatitis C Screening  Completed   Zoster (Shingles) Vaccine  Completed   Meningitis B Vaccine  Aged Out   Colon Cancer Screening  Discontinued   COVID-19 Vaccine  Discontinued  *Topic was postponed. The date shown is not the original due date.       01/29/2024    3:36 PM  Advanced Directives  Does Patient Have a Medical Advance Directive? No  Would patient like information on creating a medical advance directive? No - Patient declined    Vision: Annual vision screenings are recommended for early detection of glaucoma, cataracts, and diabetic retinopathy. These exams can also reveal signs of chronic conditions such as diabetes and high blood pressure.  Dental: Annual dental screenings help detect early signs of oral cancer, gum disease, and other conditions linked to overall health, including heart disease and diabetes.

## 2024-01-30 ENCOUNTER — Ambulatory Visit: Admitting: Internal Medicine

## 2024-01-30 ENCOUNTER — Encounter: Payer: Self-pay | Admitting: Internal Medicine

## 2024-01-30 VITALS — BP 140/76 | HR 76 | Temp 97.9°F | Ht 70.0 in | Wt 188.0 lb

## 2024-01-30 DIAGNOSIS — N1831 Chronic kidney disease, stage 3a: Secondary | ICD-10-CM

## 2024-01-30 DIAGNOSIS — E7800 Pure hypercholesterolemia, unspecified: Secondary | ICD-10-CM

## 2024-01-30 DIAGNOSIS — R739 Hyperglycemia, unspecified: Secondary | ICD-10-CM

## 2024-01-30 DIAGNOSIS — E039 Hypothyroidism, unspecified: Secondary | ICD-10-CM

## 2024-01-30 DIAGNOSIS — E538 Deficiency of other specified B group vitamins: Secondary | ICD-10-CM | POA: Diagnosis not present

## 2024-01-30 DIAGNOSIS — I10 Essential (primary) hypertension: Secondary | ICD-10-CM

## 2024-01-30 NOTE — Assessment & Plan Note (Signed)
 Lab Results  Component Value Date   HGBA1C 5.8 01/25/2024   Stable, pt to continue current medical treatment  - diet, wt control

## 2024-01-30 NOTE — Assessment & Plan Note (Signed)
 Lab Results  Component Value Date   LDLCALC 52 01/25/2024   Stable, pt to continue current statin crestor  40 mg qd

## 2024-01-30 NOTE — Assessment & Plan Note (Signed)
 Lab Results  Component Value Date   TSH 4.10 07/27/2023   Stable, pt to continue levothyroxine  75 mcg qd

## 2024-01-30 NOTE — Assessment & Plan Note (Signed)
 Lab Results  Component Value Date   CREATININE 1.21 01/25/2024   Stable overall, cont to avoid nephrotoxins ckd3a

## 2024-01-30 NOTE — Assessment & Plan Note (Signed)
 Lab Results  Component Value Date   VITAMINB12 661 01/25/2024   Stable, cont oral replacement - b12 1000 mcg qd

## 2024-01-30 NOTE — Assessment & Plan Note (Signed)
 BP Readings from Last 3 Encounters:  01/30/24 (!) 140/76  10/11/23 120/70  09/21/23 (!) 152/90   Uncontrolled here , but pt states controlled at home, pt to continue medical treatment lopressor  25 mg bid and declines other change

## 2024-01-30 NOTE — Progress Notes (Signed)
 Patient ID: Jonathon [name], male   DOB: 04-Dec-1945, 78 y.o.   MRN: [id number]        Chief Complaint: follow up HTN, HLD and hyperglycemia , low thyroid , ckd3a       HPI:  Jonathon [name] is a 78 y.o. male here overall doing ok, Pt denies chest pain, increased sob or doe, wheezing, orthopnea, PND, increased LE swelling, palpitations, dizziness or syncope.   Pt denies polydipsia, polyuria, or new focal neuro s/s.    Pt denies fever, wt loss, night sweats, loss of appetite, or other constitutional symptoms  BP has been controlled at home < 130 sbp       Wt Readings from Last 3 Encounters:  01/30/24 188 lb (85.3 kg)  01/29/24 187 lb (84.8 kg)  10/11/23 187 lb (84.8 kg)   BP Readings from Last 3 Encounters:  01/30/24 (!) 140/76  10/11/23 120/70  09/21/23 (!) 152/90         Past Medical History:  Diagnosis Date   Atrial fibrillation (HCC)    post op after CABG; tx with amiodarone    Coronary artery disease    a. s/p NSTEMI 12/12 => CABG;  b. inferior STEMI 5/14: 3/3 grafts patent, native CFX 100% => Promus DES    HLD (hyperlipidemia)    HTN (hypertension)    Lumbar disc disease 03/19/2011   Psoriasis 03/22/2011   S/P CABG (coronary artery bypass graft)    12/12 with Dr. Lucas: LIMA-LAD, SVG-RI, SVG-OM   Sciatica 03/19/2011   Thyroid  disease    Past Surgical History:  Procedure Laterality Date   COLONOSCOPY  2017   CORONARY ARTERY BYPASS GRAFT  01/19/2011   Procedure: CORONARY ARTERY BYPASS GRAFTING (CABG);  Surgeon: Dorise MARLA Lucas, MD;  Location: [facility] OR;  Service: Open Heart Surgery;  Laterality: N/A;   LEFT HEART CATH N/A 07/07/2012   Procedure: LEFT HEART CATH;  Surgeon: Peter M Jordan, MD;  Location: [facility] CATH LAB;  Service: Cardiovascular;  Laterality: N/A;   LEFT HEART CATHETERIZATION WITH CORONARY ANGIOGRAM N/A 01/13/2011   Procedure: LEFT HEART CATHETERIZATION WITH CORONARY ANGIOGRAM;  Surgeon: Peter M Jordan, MD;  Location: [facility] CATH LAB;  Service: Cardiovascular;   Laterality: N/A;   PERCUTANEOUS CORONARY STENT INTERVENTION (PCI-S)  07/07/2012   Procedure: PERCUTANEOUS CORONARY STENT INTERVENTION (PCI-S);  Surgeon: Peter M Jordan, MD;  Location: [facility] CATH LAB;  Service: Cardiovascular;;   TONSILLECTOMY      reports that he quit smoking about 55 years ago. His smoking use included cigarettes. He has never used smokeless tobacco. He reports that he does not drink alcohol and does not use drugs. family history includes Cancer in his mother; Colon cancer in his mother and son; Heart disease in his father and mother; Hyperlipidemia in his father. Allergies[1] Medications Ordered Prior to Encounter[2]      ROS:  All others reviewed and negative.  Objective        PE:  BP (!) 140/76 (BP Location: Left Arm, Patient Position: Sitting, Cuff Size: Normal)   Pulse 76   Temp 97.9 F (36.6 C) (Oral)   Ht 5' 10 (1.778 m)   Wt 188 lb (85.3 kg)   SpO2 98%   BMI 26.98 kg/m                 Constitutional: Pt appears in NAD               HENT: Head: NCAT.  Right Ear: External ear normal.                 Left Ear: External ear normal.                Eyes: . Pupils are equal, round, and reactive to light. Conjunctivae and EOM are normal               Nose: without d/c or deformity               Neck: Neck supple. Gross normal ROM               Cardiovascular: Normal rate and regular rhythm.                 Pulmonary/Chest: Effort normal and breath sounds without rales or wheezing.                Abd:  Soft, NT, ND, + BS, no organomegaly               Neurological: Pt is alert. At baseline orientation, motor grossly intact               Skin: Skin is warm. No rashes, no other new lesions, LE edema - none               Psychiatric: Pt behavior is normal without agitation   Micro: none  Cardiac tracings I have personally interpreted today:  none  Pertinent Radiological findings (summarize): none   Lab Results  Component Value Date   WBC 5.7  07/27/2023   HGB 14.1 07/27/2023   HCT 41.3 07/27/2023   PLT 165.0 07/27/2023   GLUCOSE 111 (H) 01/25/2024   CHOL 107 01/25/2024   TRIG 81.0 01/25/2024   HDL 38.90 (L) 01/25/2024   LDLCALC 52 01/25/2024   ALT 18 01/25/2024   AST 23 01/25/2024   NA 141 01/25/2024   K 4.1 01/25/2024   CL 105 01/25/2024   CREATININE 1.21 01/25/2024   BUN 14 01/25/2024   CO2 31 01/25/2024   TSH 4.10 07/27/2023   PSA 2.65 01/25/2024   INR 1.95 (H) 07/07/2012   HGBA1C 5.8 01/25/2024   MICROALBUR <0.7 07/27/2023   Assessment/Plan:  Jonathon [name] is a 78 y.o. White or Caucasian [1] male with  has a past medical history of Atrial fibrillation (HCC), Coronary artery disease, HLD (hyperlipidemia), HTN (hypertension), Lumbar disc disease (03/19/2011), Psoriasis (03/22/2011), S/P CABG (coronary artery bypass graft), Sciatica (03/19/2011), and Thyroid  disease.  Hypothyroidism Lab Results  Component Value Date   TSH 4.10 07/27/2023   Stable, pt to continue levothyroxine  75 mcg qd   Hypertension BP Readings from Last 3 Encounters:  01/30/24 (!) 140/76  10/11/23 120/70  09/21/23 (!) 152/90   Uncontrolled here , but pt states controlled at home, pt to continue medical treatment lopressor  25 mg bid and declines other change    Hyperlipidemia Lab Results  Component Value Date   LDLCALC 52 01/25/2024   Stable, pt to continue current statin crestor  40 mg qd   Hyperglycemia Lab Results  Component Value Date   HGBA1C 5.8 01/25/2024   Stable, pt to continue current medical treatment  - diet, wt control   CKD (chronic kidney disease) stage 3, GFR 30-59 ml/min (HCC) Lab Results  Component Value Date   CREATININE 1.21 01/25/2024   Stable overall, cont to avoid nephrotoxins ckd3a  B12 deficiency Lab Results  Component Value Date   VITAMINB12 661 01/25/2024  Stable, cont oral replacement - b12 1000 mcg qd  Followup: Return in 6 months (on 07/30/2024), or if symptoms worsen or fail to  improve.  Lynwood Rush, MD 01/30/2024 9:49 AM [facility]     [1]  Allergies Allergen Reactions   Dilaudid [Hydromorphone Hcl] Hypertension    Back and forth with hyper and hypotension  [2]  Current Outpatient Medications on File Prior to Visit  Medication Sig Dispense Refill   aspirin  EC 81 MG tablet Take 81 mg by mouth at bedtime.     Cyanocobalamin  (B-12 PO) Take 1,000 mcg by mouth daily.     levothyroxine  (SYNTHROID ) 75 MCG tablet Take 1 tablet (75 mcg total) by mouth daily. 90 tablet 3   metoprolol  tartrate (LOPRESSOR ) 25 MG tablet 1 tab by mouth twice per day 180 tablet 3   nitroGLYCERIN  (NITROSTAT ) 0.4 MG SL tablet Place 1 tablet (0.4 mg total) under the tongue every 5 (five) minutes as needed for chest pain. 25 tablet 3   rosuvastatin  (CRESTOR ) 40 MG tablet Take 1 tablet (40 mg total) by mouth daily. 90 tablet 3   No current facility-administered medications on file prior to visit.

## 2024-01-30 NOTE — Patient Instructions (Signed)
 Please continue all other medications as before, and refills have been done if requested.  Please have the pharmacy call with any other refills you may need.  Please continue your efforts at being more active, low cholesterol diet, and weight control.  Please keep your appointments with your specialists as you may have planned  Your lab tests were good!  Please make an Appointment to return in 6 months, or sooner if needed

## 2025-01-29 ENCOUNTER — Ambulatory Visit
